# Patient Record
Sex: Male | Born: 1955
Health system: Southern US, Community
[De-identification: ages and names within clinical notes are randomized; demographics above are authoritative.]

## PROBLEM LIST (undated history)

## (undated) DIAGNOSIS — R748 Abnormal levels of other serum enzymes: Secondary | ICD-10-CM

## (undated) DIAGNOSIS — R011 Cardiac murmur, unspecified: Secondary | ICD-10-CM

## (undated) DIAGNOSIS — Z973 Presence of spectacles and contact lenses: Secondary | ICD-10-CM

## (undated) DIAGNOSIS — K219 Gastro-esophageal reflux disease without esophagitis: Secondary | ICD-10-CM

## (undated) DIAGNOSIS — M1990 Unspecified osteoarthritis, unspecified site: Secondary | ICD-10-CM

## (undated) DIAGNOSIS — E559 Vitamin D deficiency, unspecified: Secondary | ICD-10-CM

## (undated) DIAGNOSIS — Z87442 Personal history of urinary calculi: Secondary | ICD-10-CM

## (undated) DIAGNOSIS — E7800 Pure hypercholesterolemia, unspecified: Secondary | ICD-10-CM

## (undated) DIAGNOSIS — M5410 Radiculopathy, site unspecified: Secondary | ICD-10-CM

## (undated) DIAGNOSIS — I10 Essential (primary) hypertension: Secondary | ICD-10-CM

## (undated) DIAGNOSIS — M419 Scoliosis, unspecified: Secondary | ICD-10-CM

## (undated) DIAGNOSIS — G4730 Sleep apnea, unspecified: Secondary | ICD-10-CM

## (undated) HISTORY — PX: COLONOSCOPY W/ BIOPSIES AND POLYPECTOMY: SHX1376

## (undated) HISTORY — PX: BACK SURGERY: SHX140

## (undated) HISTORY — DX: Scoliosis, unspecified: M41.9

---

## 2000-10-05 HISTORY — PX: ANTERIOR FUSION CERVICAL SPINE: SUR626

## 2003-12-27 ENCOUNTER — Observation Stay (HOSPITAL_COMMUNITY): Admission: RE | Admit: 2003-12-27 | Discharge: 2003-12-28 | Payer: Self-pay | Admitting: Neurosurgery

## 2006-11-10 ENCOUNTER — Emergency Department: Payer: Self-pay | Admitting: Emergency Medicine

## 2011-05-04 ENCOUNTER — Emergency Department: Payer: Self-pay | Admitting: Emergency Medicine

## 2011-10-12 ENCOUNTER — Emergency Department: Payer: Self-pay | Admitting: Emergency Medicine

## 2011-10-12 LAB — URINALYSIS, COMPLETE
Bacteria: NONE SEEN
Bilirubin,UR: NEGATIVE
Glucose,UR: NEGATIVE mg/dL (ref 0–75)
Ketone: NEGATIVE
Leukocyte Esterase: NEGATIVE
Nitrite: NEGATIVE
Ph: 5 (ref 4.5–8.0)
Protein: NEGATIVE
RBC,UR: 36 /HPF (ref 0–5)
Specific Gravity: 1.014 (ref 1.003–1.030)
Squamous Epithelial: NONE SEEN
WBC UR: 1 /HPF (ref 0–5)

## 2011-10-12 LAB — CBC
HCT: 44.7 % (ref 40.0–52.0)
MCH: 31.4 pg (ref 26.0–34.0)
MCHC: 34.1 g/dL (ref 32.0–36.0)
MCV: 92 fL (ref 80–100)
Platelet: 192 10*3/uL (ref 150–440)
RBC: 4.84 10*6/uL (ref 4.40–5.90)
RDW: 13.6 % (ref 11.5–14.5)

## 2011-10-12 LAB — COMPREHENSIVE METABOLIC PANEL
Alkaline Phosphatase: 38 U/L — ABNORMAL LOW (ref 50–136)
BUN: 22 mg/dL — ABNORMAL HIGH (ref 7–18)
Bilirubin,Total: 0.4 mg/dL (ref 0.2–1.0)
Chloride: 103 mmol/L (ref 98–107)
Co2: 29 mmol/L (ref 21–32)
Creatinine: 1.33 mg/dL — ABNORMAL HIGH (ref 0.60–1.30)
EGFR (African American): >60
EGFR (Non-African Amer.): 59 — ABNORMAL LOW
Glucose: 110 mg/dL — ABNORMAL HIGH (ref 65–99)
Osmolality: 285 (ref 275–301)
SGPT (ALT): 60 U/L
Sodium: 141 mmol/L (ref 136–145)
Total Protein: 7.5 g/dL (ref 6.4–8.2)

## 2011-10-12 LAB — LIPASE, BLOOD: Lipase: 92 U/L (ref 73–393)

## 2014-05-21 ENCOUNTER — Ambulatory Visit: Payer: Self-pay | Admitting: Gastroenterology

## 2014-05-21 LAB — HM COLONOSCOPY

## 2015-11-29 ENCOUNTER — Encounter: Payer: Self-pay | Admitting: Family Medicine

## 2015-11-29 ENCOUNTER — Ambulatory Visit (INDEPENDENT_AMBULATORY_CARE_PROVIDER_SITE_OTHER): Payer: 59 | Admitting: Family Medicine

## 2015-11-29 VITALS — BP 114/72 | HR 58 | Temp 98.2°F | Ht 65.0 in | Wt 213.6 lb

## 2015-11-29 DIAGNOSIS — Z0001 Encounter for general adult medical examination with abnormal findings: Secondary | ICD-10-CM

## 2015-11-29 DIAGNOSIS — M21372 Foot drop, left foot: Secondary | ICD-10-CM | POA: Diagnosis not present

## 2015-11-29 DIAGNOSIS — R6889 Other general symptoms and signs: Secondary | ICD-10-CM

## 2015-11-29 DIAGNOSIS — Z0000 Encounter for general adult medical examination without abnormal findings: Secondary | ICD-10-CM | POA: Insufficient documentation

## 2015-11-29 NOTE — Assessment & Plan Note (Signed)
Overall doing well. Discussed diet and exercise. Advised him to eat more vegetables. He is up-to-date on colon cancer screening. He declined a flu shot. He is up-to-date on other vaccinations and HIV and hepatitis C testing. We will order labs as outlined below.

## 2015-11-29 NOTE — Assessment & Plan Note (Signed)
Patient describes symptoms of foot drop on the left side. No abnormalities seen on gait today. He does have numbness on the bottom of his left foot. Given his prior MRI findings of diffuse lumbar DDD concern would be that he has a nerve compression. We will repeat an MRI of his lumbar spine without contrast to evaluate this further. He has no red flags. He is otherwise neurologically intact. He'll continue to monitor. He is given return precautions.

## 2015-11-29 NOTE — Progress Notes (Signed)
Patient ID: Bobby [name], male   DOB: June 03, 1956, 60 y.o.   MRN: [id number]  Bobby Rumps, MD Phone: [phone]  Bobby [name] is a 60 y.o. male who presents today for new patient visit. Physical exam.  Patient reports he does not exercise. His diet is described as not being typically good. He eats whatever he wants. Does not eat many vegetables. Declines flu shot. Last tetanus shot was 2010. Colonoscopy was in 2015. Reports having had hepatitis C and HIV screening in the 1990s. Drinks alcohol socially. No cigarettes. No tobacco use. No recreational drug use.  Left foot numbness: Patient notes for the last year the bottom of his left foot has felt numb and tingly. Mostly in the ball of his foot. He also notes foot drop and describes this as his left foot flopping down. Notes the foot feels weaker than the other foot. Notes chronic back pain related to scoliosis. He denies saddle anesthesia, bowel or bladder incontinence, fevers, and history of cancer. He had an MRI several years ago that showed diffuse DDD.  Active Ambulatory Problems    Diagnosis Date Noted  . Foot drop, left 11/29/2015  . Encounter for general adult medical examination with abnormal findings 11/29/2015   Resolved Ambulatory Problems    Diagnosis Date Noted  . No Resolved Ambulatory Problems   Past Medical History  Diagnosis Date  . Kidney stones   . Scoliosis     Family History  Problem Relation Age of Onset  . Colon cancer    . Hyperlipidemia    . Hypertension    . Kidney disease      Social History   Social History  . Marital Status: Married    Spouse Name: N/A  . Number of Children: N/A  . Years of Education: N/A   Occupational History  . Not on file.   Social History Main Topics  . Smoking status: Former Research scientist (life sciences)  . Smokeless tobacco: Not on file  . Alcohol Use: 0.6 oz/week    1 Standard drinks or equivalent per week  . Drug Use: No  . Sexual Activity: Not on file   Other Topics  Concern  . Not on file   Social History Narrative  . No narrative on file    ROS   General:  Negative for nexplained weight loss, fever Skin: Negative for new or changing mole, sore that won't heal HEENT: Negative for trouble hearing, trouble seeing, ringing in ears, mouth sores, hoarseness, change in voice, dysphagia. CV:  Negative for chest pain, dyspnea, edema, palpitations Resp: Negative for cough, dyspnea, hemoptysis GI: Negative for nausea, vomiting, diarrhea, constipation, abdominal pain, melena, hematochezia. GU: Negative for dysuria, incontinence, urinary hesitance, hematuria, vaginal or penile discharge, polyuria, sexual difficulty, lumps in testicle or breasts MSK: Positive for joint pain, Negative for muscle cramps or aches, joint swelling Neuro: Positive for numbness, Negative for headaches, weakness, dizziness, passing out/fainting Psych: Negative for depression, anxiety, memory problems  Objective  Physical Exam Filed Vitals:   11/29/15 1042  BP: 114/72  Pulse: 58  Temp: 98.2 F (36.8 C)    BP Readings from Last 3 Encounters:  11/29/15 114/72   Wt Readings from Last 3 Encounters:  11/29/15 213 lb 9.6 oz (96.888 kg)    Physical Exam  Constitutional: He is well-developed, well-nourished, and in no distress.  HENT:  Head: Normocephalic and atraumatic.  Right Ear: External ear normal.  Left Ear: External ear normal.  Mouth/Throat: Oropharynx is clear and moist. No  oropharyngeal exudate.  Eyes: Conjunctivae are normal. Pupils are equal, round, and reactive to light.  Neck: Neck supple.  Cardiovascular: Normal rate, regular rhythm and normal heart sounds.  Exam reveals no gallop and no friction rub.   No murmur heard. Pulmonary/Chest: Effort normal and breath sounds normal. No respiratory distress. He has no wheezes. He has no rales.  Abdominal: Soft. Bowel sounds are normal. He exhibits no distension. There is no tenderness. There is no rebound and no  guarding.  Musculoskeletal:  No midline spine step-off or tenderness, no muscular back tenderness  Lymphadenopathy:    He has no cervical adenopathy.  Neurological: He is alert.  CN 2-12 intact, 5/5 strength in bilateral biceps, triceps, grip, quads, hamstrings, plantar and dorsiflexion, decreased sensation in the forefoot of his left foot with normal sensation in his toes, otherwise sensation to light touch intact in bilateral UE and LE, normal gait, 2+ patellar and Achilles reflexes  Skin: Skin is warm and dry. He is not diaphoretic.  Psychiatric: Mood and affect normal.     Assessment/Plan:   Foot drop, left Patient describes symptoms of foot drop on the left side. No abnormalities seen on gait today. He does have numbness on the bottom of his left foot. Given his prior MRI findings of diffuse lumbar DDD concern would be that he has a nerve compression. We will repeat an MRI of his lumbar spine without contrast to evaluate this further. He has no red flags. He is otherwise neurologically intact. He'll continue to monitor. He is given return precautions.  Encounter for general adult medical examination with abnormal findings Overall doing well. Discussed diet and exercise. Advised him to eat more vegetables. He is up-to-date on colon cancer screening. He declined a flu shot. He is up-to-date on other vaccinations and HIV and hepatitis C testing. We will order labs as outlined below.     Orders Placed This Encounter  Procedures  . MR Lumbar Spine Wo Contrast    Standing Status: Future     Number of Occurrences:      Standing Expiration Date: 01/26/2017    Order Specific Question:  Reason for Exam (SYMPTOM  OR DIAGNOSIS REQUIRED)    Answer:  left foot drop and numbness in ball of foot    Order Specific Question:  Preferred imaging location?    Answer:  [facility] (table limit-300lbs)    Order Specific Question:  Does the patient have a pacemaker or implanted devices?    Answer:   No    Order Specific Question:  What is the patient's sedation requirement?    Answer:  No Sedation  . Comp Met (CMET)  . CBC  . PSA  . TSH  . Lipid Profile    Bobby [name]

## 2015-11-29 NOTE — Patient Instructions (Signed)
Nice to meet you. We will order an MRI of your low back. We will obtain labs as well.  If you develop numbness, weakness, loss of bowel or bladder function, fever, or any new or change in symptoms please seek medical attention.

## 2015-11-29 NOTE — Progress Notes (Signed)
Pre visit review using our clinic review tool, if applicable. No additional management support is needed unless otherwise documented below in the visit note. 

## 2015-11-30 LAB — CBC
HEMATOCRIT: 44.6 % (ref 37.5–51.0)
HEMOGLOBIN: 15.4 g/dL (ref 12.6–17.7)
MCH: 30.4 pg (ref 26.6–33.0)
MCHC: 34.5 g/dL (ref 31.5–35.7)
MCV: 88 fL (ref 79–97)
Platelets: 219 10*3/uL (ref 150–379)
RBC: 5.06 x10E6/uL (ref 4.14–5.80)
RDW: 13.8 % (ref 12.3–15.4)
WBC: 6.5 10*3/uL (ref 3.4–10.8)

## 2015-11-30 LAB — COMPREHENSIVE METABOLIC PANEL
ALBUMIN: 4.4 g/dL (ref 3.5–5.5)
ALT: 48 IU/L — ABNORMAL HIGH (ref 0–44)
AST: 29 IU/L (ref 0–40)
Albumin/Globulin Ratio: 1.9 (ref 1.1–2.5)
Alkaline Phosphatase: 65 IU/L (ref 39–117)
BILIRUBIN TOTAL: 0.4 mg/dL (ref 0.0–1.2)
BUN/Creatinine Ratio: 17 (ref 9–20)
BUN: 16 mg/dL (ref 6–24)
CHLORIDE: 102 mmol/L (ref 96–106)
CO2: 23 mmol/L (ref 18–29)
Calcium: 9.6 mg/dL (ref 8.7–10.2)
Creatinine, Ser: 0.95 mg/dL (ref 0.76–1.27)
GFR calc non Af Amer: 87 mL/min/{1.73_m2} (ref >59)
GFR, EST AFRICAN AMERICAN: 101 mL/min/{1.73_m2} (ref >59)
GLOBULIN, TOTAL: 2.3 g/dL (ref 1.5–4.5)
GLUCOSE: 92 mg/dL (ref 65–99)
Potassium: 5 mmol/L (ref 3.5–5.2)
Sodium: 141 mmol/L (ref 134–144)
TOTAL PROTEIN: 6.7 g/dL (ref 6.0–8.5)

## 2015-11-30 LAB — PSA: Prostate Specific Ag, Serum: 1.1 ng/mL (ref 0.0–4.0)

## 2015-11-30 LAB — LIPID PANEL
CHOLESTEROL TOTAL: 177 mg/dL (ref 100–199)
Chol/HDL Ratio: 3.7 ratio units (ref 0.0–5.0)
HDL: 48 mg/dL (ref >39)
LDL Calculated: 96 mg/dL (ref 0–99)
TRIGLYCERIDES: 163 mg/dL — AB (ref 0–149)
VLDL CHOLESTEROL CAL: 33 mg/dL (ref 5–40)

## 2015-11-30 LAB — TSH: TSH: 1.87 u[IU]/mL (ref 0.450–4.500)

## 2015-12-23 ENCOUNTER — Telehealth: Payer: Self-pay | Admitting: *Deleted

## 2015-12-23 ENCOUNTER — Ambulatory Visit: Payer: 59

## 2015-12-23 ENCOUNTER — Other Ambulatory Visit (INDEPENDENT_AMBULATORY_CARE_PROVIDER_SITE_OTHER): Payer: 59

## 2015-12-23 DIAGNOSIS — R7989 Other specified abnormal findings of blood chemistry: Secondary | ICD-10-CM

## 2015-12-23 DIAGNOSIS — R945 Abnormal results of liver function studies: Principal | ICD-10-CM

## 2015-12-23 NOTE — Telephone Encounter (Signed)
Orders placed.

## 2015-12-23 NOTE — Telephone Encounter (Signed)
Labs and dx?  

## 2015-12-24 LAB — COMPREHENSIVE METABOLIC PANEL WITH GFR
ALT: 40 U/L (ref 0–53)
AST: 26 U/L (ref 0–37)
Albumin: 4.4 g/dL (ref 3.5–5.2)
Alkaline Phosphatase: 57 U/L (ref 39–117)
BUN: 17 mg/dL (ref 6–23)
CO2: 29 meq/L (ref 19–32)
Calcium: 9.6 mg/dL (ref 8.4–10.5)
Chloride: 104 meq/L (ref 96–112)
Creatinine, Ser: 1.1 mg/dL (ref 0.40–1.50)
GFR: 72.62 mL/min
Glucose, Bld: 91 mg/dL (ref 70–99)
Potassium: 4.4 meq/L (ref 3.5–5.1)
Sodium: 138 meq/L (ref 135–145)
Total Bilirubin: 0.5 mg/dL (ref 0.2–1.2)
Total Protein: 6.6 g/dL (ref 6.0–8.3)

## 2015-12-25 ENCOUNTER — Encounter: Payer: Self-pay | Admitting: Family Medicine

## 2016-01-07 ENCOUNTER — Ambulatory Visit
Admission: RE | Admit: 2016-01-07 | Discharge: 2016-01-07 | Disposition: A | Discharge status: Home / Self Care | Payer: 59 | Source: Ambulatory Visit | Attending: Family Medicine | Admitting: Family Medicine

## 2016-01-07 DIAGNOSIS — M5136 Other intervertebral disc degeneration, lumbar region: Secondary | ICD-10-CM | POA: Diagnosis not present

## 2016-01-07 DIAGNOSIS — M4806 Spinal stenosis, lumbar region: Secondary | ICD-10-CM | POA: Diagnosis not present

## 2016-01-07 DIAGNOSIS — M4186 Other forms of scoliosis, lumbar region: Secondary | ICD-10-CM | POA: Insufficient documentation

## 2016-01-07 DIAGNOSIS — M21372 Foot drop, left foot: Secondary | ICD-10-CM | POA: Insufficient documentation

## 2016-01-10 ENCOUNTER — Other Ambulatory Visit: Payer: Self-pay | Admitting: Family Medicine

## 2016-01-10 DIAGNOSIS — M48061 Spinal stenosis, lumbar region without neurogenic claudication: Secondary | ICD-10-CM

## 2016-01-13 ENCOUNTER — Telehealth: Payer: Self-pay | Admitting: Family Medicine

## 2016-01-13 NOTE — Telephone Encounter (Signed)
Pt returned your call. Pt request to call him back after 4pm.msn

## 2016-01-13 NOTE — Telephone Encounter (Signed)
His results were posted to his MyChart this was left on his voicemail.

## 2016-02-03 ENCOUNTER — Other Ambulatory Visit: Payer: Self-pay | Admitting: Neurosurgery

## 2016-02-10 ENCOUNTER — Encounter (HOSPITAL_COMMUNITY): Payer: Self-pay

## 2016-02-10 ENCOUNTER — Encounter (HOSPITAL_COMMUNITY)
Admission: RE | Admit: 2016-02-10 | Discharge: 2016-02-10 | Disposition: A | Discharge status: Home / Self Care | Payer: 59 | Source: Ambulatory Visit | Attending: Neurosurgery | Admitting: Neurosurgery

## 2016-02-10 DIAGNOSIS — M4726 Other spondylosis with radiculopathy, lumbar region: Secondary | ICD-10-CM | POA: Diagnosis not present

## 2016-02-10 DIAGNOSIS — Z01812 Encounter for preprocedural laboratory examination: Secondary | ICD-10-CM | POA: Insufficient documentation

## 2016-02-10 HISTORY — DX: Radiculopathy, site unspecified: M54.10

## 2016-02-10 HISTORY — DX: Cardiac murmur, unspecified: R01.1

## 2016-02-10 HISTORY — DX: Unspecified osteoarthritis, unspecified site: M19.90

## 2016-02-10 HISTORY — DX: Pure hypercholesterolemia, unspecified: E78.00

## 2016-02-10 HISTORY — DX: Abnormal levels of other serum enzymes: R74.8

## 2016-02-10 HISTORY — DX: Vitamin D deficiency, unspecified: E55.9

## 2016-02-10 HISTORY — DX: Sleep apnea, unspecified: G47.30

## 2016-02-10 HISTORY — DX: Gastro-esophageal reflux disease without esophagitis: K21.9

## 2016-02-10 HISTORY — DX: Presence of spectacles and contact lenses: Z97.3

## 2016-02-10 LAB — COMPREHENSIVE METABOLIC PANEL
ALK PHOS: 55 U/L (ref 38–126)
ALT: 43 U/L (ref 17–63)
ANION GAP: 8 (ref 5–15)
AST: 34 U/L (ref 15–41)
Albumin: 4.1 g/dL (ref 3.5–5.0)
BILIRUBIN TOTAL: 0.3 mg/dL (ref 0.3–1.2)
BUN: 14 mg/dL (ref 6–20)
CALCIUM: 9.4 mg/dL (ref 8.9–10.3)
CO2: 25 mmol/L (ref 22–32)
Chloride: 106 mmol/L (ref 101–111)
Creatinine, Ser: 1.02 mg/dL (ref 0.61–1.24)
GFR calc Af Amer: >60 mL/min (ref >60)
Glucose, Bld: 88 mg/dL (ref 65–99)
POTASSIUM: 4.4 mmol/L (ref 3.5–5.1)
Sodium: 139 mmol/L (ref 135–145)
TOTAL PROTEIN: 6.8 g/dL (ref 6.5–8.1)

## 2016-02-10 LAB — CBC
HEMATOCRIT: 43.7 % (ref 39.0–52.0)
Hemoglobin: 14.8 g/dL (ref 13.0–17.0)
MCH: 29.7 pg (ref 26.0–34.0)
MCHC: 33.9 g/dL (ref 30.0–36.0)
MCV: 87.6 fL (ref 78.0–100.0)
PLATELETS: 183 10*3/uL (ref 150–400)
RBC: 4.99 MIL/uL (ref 4.22–5.81)
RDW: 13 % (ref 11.5–15.5)
WBC: 6.5 10*3/uL (ref 4.0–10.5)

## 2016-02-10 LAB — SURGICAL PCR SCREEN
MRSA, PCR: NEGATIVE
Staphylococcus aureus: NEGATIVE

## 2016-02-10 NOTE — Pre-Procedure Instructions (Signed)
Bobby [name]  02/10/2016      [id number] - [place], [place] AT [facility] OF [address] [place] [address] Phone: [phone] Fax: [phone]    Your procedure is scheduled on Wednesday, Feb 19, 2016  Report to [facility] Admitting at 10:30 A.M.  Call this number if you have problems the morning of surgery:  [phone]   Remember:  Do not eat food or drink liquids after midnight Tuesday, Feb 18, 2016  Take these medicines the morning of surgery with A SIP OF WATER: if needed: TraMADol Veatrice Bourbon) for pain Stop taking Aspirin, vitamins, fish oil and herbal medications. Do not take any NSAIDs ie: Ibuprofen, Advil, Naproxen, BC and Goody Powder or any medication containing Aspirin; stop Wednesday, Feb 12, 2016.  Do not wear jewelry, make-up or nail polish.  Do not wear lotions, powders, or perfumes.  You may not wear deodorant.  Do not shave 48 hours prior to surgery.  Men may shave face and neck.  Do not bring valuables to the hospital.  [facility] is not responsible for any belongings or valuables.  Contacts, dentures or bridgework may not be worn into surgery.  Leave your suitcase in the car.  After surgery it may be brought to your room.  For patients admitted to the hospital, discharge time will be determined by your treatment team.  Patients discharged the day of surgery will not be allowed to drive home.   Name and phone number of your driver:   Special instructions:  Special Instructions:Special Instructions: [facility] - Preparing for Surgery  Before surgery, you can play an important role.  Because skin is not sterile, your skin needs to be as free of germs as possible.  You can reduce the number of germs on you skin by washing with CHG (chlorahexidine gluconate) soap before surgery.  CHG is an antiseptic cleaner which kills germs and bonds with the skin to continue killing germs even after  washing.  Please DO NOT use if you have an allergy to CHG or antibacterial soaps.  If your skin becomes reddened/irritated stop using the CHG and inform your nurse when you arrive at Short Stay.  Do not shave (including legs and underarms) for at least 48 hours prior to the first CHG shower.  You may shave your face.  Please follow these instructions carefully:   1.  Shower with CHG Soap the night before surgery and the morning of Surgery.  2.  If you choose to wash your hair, wash your hair first as usual with your normal shampoo.  3.  After you shampoo, rinse your hair and body thoroughly to remove the Shampoo.  4.  Use CHG as you would any other liquid soap.  You can apply chg directly  to the skin and wash gently with scrungie or a clean washcloth.  5.  Apply the CHG Soap to your body ONLY FROM THE NECK DOWN.  Do not use on open wounds or open sores.  Avoid contact with your eyes, ears, mouth and genitals (private parts).  Wash genitals (private parts) with your normal soap.  6.  Wash thoroughly, paying special attention to the area where your surgery will be performed.  7.  Thoroughly rinse your body with warm water from the neck down.  8.  DO NOT shower/wash with your normal soap after using and rinsing off the CHG  Soap.  9.  Pat yourself dry with a clean towel.            10.  Wear clean pajamas.            11.  Place clean sheets on your bed the night of your first shower and do not sleep with pets.  Day of Surgery  Do not apply any lotions/deodorants the morning of surgery.  Please wear clean clothes to the hospital/surgery center.  Please read over the following fact sheets that you were given. Pain Booklet, Coughing and Deep Breathing, MRSA Information and Surgical Site Infection Prevention

## 2016-02-10 NOTE — Progress Notes (Signed)
Pt denies SOB, chest pain, and being under the care of a cardiologist. Pt denies having a cardiac cath and echo but stated that a stress test was done >15 years ago. Spoke with Willeen Cass, NP, Anesthesia, regarding pt history of heart murmur " as a child only" and elevated liver enzymes when started on previous cholesterol medication; according to Levada Dy, NP, CMP is okay but EKG is not needed.

## 2016-02-19 ENCOUNTER — Ambulatory Visit (HOSPITAL_COMMUNITY): Payer: 59 | Admitting: Anesthesiology

## 2016-02-19 ENCOUNTER — Ambulatory Visit (HOSPITAL_COMMUNITY): Payer: 59

## 2016-02-19 ENCOUNTER — Encounter (HOSPITAL_COMMUNITY): Payer: Self-pay | Admitting: General Practice

## 2016-02-19 ENCOUNTER — Encounter (HOSPITAL_COMMUNITY)
Admission: RE | Disposition: A | Discharge status: Home / Self Care | Payer: Self-pay | Source: Ambulatory Visit | Attending: Neurosurgery

## 2016-02-19 ENCOUNTER — Observation Stay (HOSPITAL_COMMUNITY)
Admission: RE | Admit: 2016-02-19 | Discharge: 2016-02-20 | Disposition: A | Discharge status: Home / Self Care | Payer: 59 | Source: Ambulatory Visit | Attending: Neurosurgery | Admitting: Neurosurgery

## 2016-02-19 DIAGNOSIS — Z7982 Long term (current) use of aspirin: Secondary | ICD-10-CM | POA: Insufficient documentation

## 2016-02-19 DIAGNOSIS — Z87891 Personal history of nicotine dependence: Secondary | ICD-10-CM | POA: Insufficient documentation

## 2016-02-19 DIAGNOSIS — M21372 Foot drop, left foot: Secondary | ICD-10-CM | POA: Diagnosis not present

## 2016-02-19 DIAGNOSIS — Z6836 Body mass index (BMI) 36.0-36.9, adult: Secondary | ICD-10-CM | POA: Diagnosis not present

## 2016-02-19 DIAGNOSIS — K219 Gastro-esophageal reflux disease without esophagitis: Secondary | ICD-10-CM | POA: Diagnosis not present

## 2016-02-19 DIAGNOSIS — G4730 Sleep apnea, unspecified: Secondary | ICD-10-CM | POA: Diagnosis not present

## 2016-02-19 DIAGNOSIS — R011 Cardiac murmur, unspecified: Secondary | ICD-10-CM | POA: Insufficient documentation

## 2016-02-19 DIAGNOSIS — M419 Scoliosis, unspecified: Secondary | ICD-10-CM | POA: Insufficient documentation

## 2016-02-19 DIAGNOSIS — N400 Enlarged prostate without lower urinary tract symptoms: Secondary | ICD-10-CM | POA: Insufficient documentation

## 2016-02-19 DIAGNOSIS — M4806 Spinal stenosis, lumbar region: Principal | ICD-10-CM | POA: Insufficient documentation

## 2016-02-19 DIAGNOSIS — Z419 Encounter for procedure for purposes other than remedying health state, unspecified: Secondary | ICD-10-CM

## 2016-02-19 DIAGNOSIS — E785 Hyperlipidemia, unspecified: Secondary | ICD-10-CM | POA: Diagnosis not present

## 2016-02-19 DIAGNOSIS — M4726 Other spondylosis with radiculopathy, lumbar region: Secondary | ICD-10-CM | POA: Diagnosis present

## 2016-02-19 DIAGNOSIS — E669 Obesity, unspecified: Secondary | ICD-10-CM | POA: Insufficient documentation

## 2016-02-19 HISTORY — PX: LUMBAR LAMINECTOMY/DECOMPRESSION MICRODISCECTOMY: SHX5026

## 2016-02-19 SURGERY — LUMBAR LAMINECTOMY/DECOMPRESSION MICRODISCECTOMY 2 LEVELS
Anesthesia: General | Site: Back | Laterality: Left

## 2016-02-19 MED ORDER — ASPIRIN EC 81 MG PO TBEC
81.0000 mg | DELAYED_RELEASE_TABLET | Freq: Every day | ORAL | Status: DC
  Administered 2016-02-19: 81 mg via ORAL
  Filled 2016-02-19: qty 1

## 2016-02-19 MED ORDER — LIDOCAINE HCL (CARDIAC) 20 MG/ML IV SOLN
INTRAVENOUS | Status: DC | PRN
  Administered 2016-02-19: 80 mg via INTRAVENOUS

## 2016-02-19 MED ORDER — KETOROLAC TROMETHAMINE 30 MG/ML IJ SOLN
30.0000 mg | Freq: Four times a day (QID) | INTRAMUSCULAR | Status: DC
  Administered 2016-02-19 – 2016-02-20 (×2): 30 mg via INTRAVENOUS
  Filled 2016-02-19 (×2): qty 1

## 2016-02-19 MED ORDER — EPHEDRINE 5 MG/ML INJ
INTRAVENOUS | Status: AC
  Filled 2016-02-19: qty 10

## 2016-02-19 MED ORDER — ROCURONIUM BROMIDE 50 MG/5ML IV SOLN
INTRAVENOUS | Status: AC
  Filled 2016-02-19: qty 2

## 2016-02-19 MED ORDER — MIDAZOLAM HCL 5 MG/5ML IJ SOLN
INTRAMUSCULAR | Status: DC | PRN
  Administered 2016-02-19: 2 mg via INTRAVENOUS

## 2016-02-19 MED ORDER — ONDANSETRON HCL 4 MG/2ML IJ SOLN
INTRAMUSCULAR | Status: DC | PRN
  Administered 2016-02-19: 4 mg via INTRAVENOUS

## 2016-02-19 MED ORDER — OXYCODONE-ACETAMINOPHEN 5-325 MG PO TABS
1.0000 | ORAL_TABLET | ORAL | Status: DC | PRN
  Administered 2016-02-19 – 2016-02-20 (×3): 2 via ORAL
  Filled 2016-02-19 (×4): qty 2

## 2016-02-19 MED ORDER — SUFENTANIL CITRATE 50 MCG/ML IV SOLN
INTRAVENOUS | Status: AC
  Filled 2016-02-19: qty 1

## 2016-02-19 MED ORDER — DOCUSATE SODIUM 100 MG PO CAPS
100.0000 mg | ORAL_CAPSULE | Freq: Two times a day (BID) | ORAL | Status: DC
  Administered 2016-02-19: 100 mg via ORAL
  Filled 2016-02-19: qty 1

## 2016-02-19 MED ORDER — DEXTROSE 5 % IV SOLN
10.0000 mg | INTRAVENOUS | Status: DC | PRN
  Administered 2016-02-19: 40 ug/min via INTRAVENOUS

## 2016-02-19 MED ORDER — SENNOSIDES-DOCUSATE SODIUM 8.6-50 MG PO TABS: 1.0000 | ORAL_TABLET | Freq: Every evening | ORAL | Status: DC | PRN

## 2016-02-19 MED ORDER — LACTATED RINGERS IV SOLN
INTRAVENOUS | Status: DC
  Administered 2016-02-19: 11:00:00 via INTRAVENOUS

## 2016-02-19 MED ORDER — BUPIVACAINE HCL (PF) 0.5 % IJ SOLN
INTRAMUSCULAR | Status: DC | PRN
  Administered 2016-02-19: 30 mL

## 2016-02-19 MED ORDER — ONDANSETRON HCL 4 MG/2ML IJ SOLN: 4.0000 mg | INTRAMUSCULAR | Status: DC | PRN

## 2016-02-19 MED ORDER — CEFAZOLIN SODIUM-DEXTROSE 2-4 GM/100ML-% IV SOLN
INTRAVENOUS | Status: AC
  Filled 2016-02-19: qty 100

## 2016-02-19 MED ORDER — TRAMADOL HCL 50 MG PO TABS: 50.0000 mg | ORAL_TABLET | Freq: Four times a day (QID) | ORAL | Status: DC | PRN

## 2016-02-19 MED ORDER — BISACODYL 5 MG PO TBEC: 5.0000 mg | DELAYED_RELEASE_TABLET | Freq: Every day | ORAL | Status: DC | PRN

## 2016-02-19 MED ORDER — PHENOL 1.4 % MT LIQD: 1.0000 | OROMUCOSAL | Status: DC | PRN

## 2016-02-19 MED ORDER — ROCURONIUM BROMIDE 100 MG/10ML IV SOLN
INTRAVENOUS | Status: DC | PRN
Start: 2016-02-19 — End: 2016-02-19
  Administered 2016-02-19: 40 mg via INTRAVENOUS
  Administered 2016-02-19: 10 mg via INTRAVENOUS

## 2016-02-19 MED ORDER — HYDROMORPHONE HCL 1 MG/ML IJ SOLN
INTRAMUSCULAR | Status: AC
  Filled 2016-02-19: qty 1

## 2016-02-19 MED ORDER — ATORVASTATIN CALCIUM 20 MG PO TABS
20.0000 mg | ORAL_TABLET | Freq: Every day | ORAL | Status: DC
  Administered 2016-02-19: 20 mg via ORAL
  Filled 2016-02-19: qty 1

## 2016-02-19 MED ORDER — NEOSTIGMINE METHYLSULFATE 5 MG/5ML IV SOSY
PREFILLED_SYRINGE | INTRAVENOUS | Status: AC
  Filled 2016-02-19: qty 5

## 2016-02-19 MED ORDER — HYDROMORPHONE HCL 1 MG/ML IJ SOLN: 0.5000 mg | INTRAMUSCULAR | Status: DC | PRN

## 2016-02-19 MED ORDER — ZOLPIDEM TARTRATE 5 MG PO TABS: 5.0000 mg | ORAL_TABLET | Freq: Every evening | ORAL | Status: DC | PRN

## 2016-02-19 MED ORDER — PROPOFOL 10 MG/ML IV BOLUS
INTRAVENOUS | Status: DC | PRN
  Administered 2016-02-19: 140 mg via INTRAVENOUS

## 2016-02-19 MED ORDER — 0.9 % SODIUM CHLORIDE (POUR BTL) OPTIME
TOPICAL | Status: DC | PRN
  Administered 2016-02-19: 1000 mL

## 2016-02-19 MED ORDER — SODIUM CHLORIDE 0.9 % IJ SOLN
INTRAMUSCULAR | Status: AC
  Filled 2016-02-19: qty 10

## 2016-02-19 MED ORDER — OXYCODONE-ACETAMINOPHEN 5-325 MG PO TABS
ORAL_TABLET | ORAL | Status: AC
  Filled 2016-02-19: qty 2

## 2016-02-19 MED ORDER — LIDOCAINE 2% (20 MG/ML) 5 ML SYRINGE
INTRAMUSCULAR | Status: AC
  Filled 2016-02-19: qty 5

## 2016-02-19 MED ORDER — VITAMIN D 1000 UNITS PO TABS: 2000.0000 [IU] | ORAL_TABLET | Freq: Every morning | ORAL | Status: DC

## 2016-02-19 MED ORDER — ONDANSETRON HCL 4 MG/2ML IJ SOLN
4.0000 mg | Freq: Once | INTRAMUSCULAR | Status: DC | PRN
Start: 2016-02-19 — End: 2016-02-19

## 2016-02-19 MED ORDER — THROMBIN 20000 UNITS EX SOLR
CUTANEOUS | Status: DC | PRN
  Administered 2016-02-19: 15:00:00 via TOPICAL

## 2016-02-19 MED ORDER — SUFENTANIL CITRATE 50 MCG/ML IV SOLN
INTRAVENOUS | Status: DC | PRN
  Administered 2016-02-19: 5 ug via INTRAVENOUS
  Administered 2016-02-19: 20 ug via INTRAVENOUS

## 2016-02-19 MED ORDER — NEOSTIGMINE METHYLSULFATE 10 MG/10ML IV SOLN
INTRAVENOUS | Status: DC | PRN
  Administered 2016-02-19: 3 mg via INTRAVENOUS

## 2016-02-19 MED ORDER — ACETAMINOPHEN 325 MG PO TABS: 650.0000 mg | ORAL_TABLET | ORAL | Status: DC | PRN

## 2016-02-19 MED ORDER — EPHEDRINE SULFATE 50 MG/ML IJ SOLN
INTRAMUSCULAR | Status: DC | PRN
  Administered 2016-02-19: 5 mg via INTRAVENOUS

## 2016-02-19 MED ORDER — SODIUM CHLORIDE 0.9% FLUSH
3.0000 mL | Freq: Two times a day (BID) | INTRAVENOUS | Status: DC
  Administered 2016-02-19: 3 mL via INTRAVENOUS

## 2016-02-19 MED ORDER — PROPOFOL 10 MG/ML IV BOLUS
INTRAVENOUS | Status: AC
  Filled 2016-02-19: qty 20

## 2016-02-19 MED ORDER — MEPERIDINE HCL 25 MG/ML IJ SOLN: 6.2500 mg | INTRAMUSCULAR | Status: DC | PRN

## 2016-02-19 MED ORDER — TAMSULOSIN HCL 0.4 MG PO CAPS: 0.4000 mg | ORAL_CAPSULE | Freq: Every day | ORAL | Status: DC

## 2016-02-19 MED ORDER — GLYCOPYRROLATE 0.2 MG/ML IV SOSY
PREFILLED_SYRINGE | INTRAVENOUS | Status: AC
  Filled 2016-02-19: qty 3

## 2016-02-19 MED ORDER — POTASSIUM CHLORIDE IN NACL 20-0.9 MEQ/L-% IV SOLN
INTRAVENOUS | Status: DC
  Filled 2016-02-19 (×3): qty 1000

## 2016-02-19 MED ORDER — ACETAMINOPHEN 10 MG/ML IV SOLN
INTRAVENOUS | Status: AC
  Administered 2016-02-19: 1000 mg via INTRAVENOUS
  Filled 2016-02-19: qty 100

## 2016-02-19 MED ORDER — PHENYLEPHRINE 40 MCG/ML (10ML) SYRINGE FOR IV PUSH (FOR BLOOD PRESSURE SUPPORT)
PREFILLED_SYRINGE | INTRAVENOUS | Status: AC
  Filled 2016-02-19: qty 10

## 2016-02-19 MED ORDER — LACTATED RINGERS IV SOLN
INTRAVENOUS | Status: DC | PRN
Start: 2016-02-19 — End: 2016-02-19
  Administered 2016-02-19 (×2): via INTRAVENOUS

## 2016-02-19 MED ORDER — HYDROMORPHONE HCL 1 MG/ML IJ SOLN
0.2500 mg | INTRAMUSCULAR | Status: DC | PRN
  Administered 2016-02-19 (×4): 0.5 mg via INTRAVENOUS

## 2016-02-19 MED ORDER — MENTHOL 3 MG MT LOZG: 1.0000 | LOZENGE | OROMUCOSAL | Status: DC | PRN

## 2016-02-19 MED ORDER — ACETAMINOPHEN 650 MG RE SUPP: 650.0000 mg | RECTAL | Status: DC | PRN

## 2016-02-19 MED ORDER — LIDOCAINE-EPINEPHRINE 0.5 %-1:200000 IJ SOLN
INTRAMUSCULAR | Status: DC | PRN
  Administered 2016-02-19: 10 mL

## 2016-02-19 MED ORDER — SODIUM CHLORIDE 0.9% FLUSH: 3.0000 mL | INTRAVENOUS | Status: DC | PRN

## 2016-02-19 MED ORDER — MIDAZOLAM HCL 2 MG/2ML IJ SOLN
INTRAMUSCULAR | Status: AC
  Filled 2016-02-19: qty 2

## 2016-02-19 MED ORDER — DIAZEPAM 5 MG PO TABS
5.0000 mg | ORAL_TABLET | Freq: Four times a day (QID) | ORAL | Status: DC | PRN
  Administered 2016-02-19: 5 mg via ORAL
  Filled 2016-02-19: qty 1

## 2016-02-19 MED ORDER — HYDROCODONE-ACETAMINOPHEN 5-325 MG PO TABS: 1.0000 | ORAL_TABLET | ORAL | Status: DC | PRN

## 2016-02-19 MED ORDER — GLYCOPYRROLATE 0.2 MG/ML IJ SOLN
INTRAMUSCULAR | Status: DC | PRN
  Administered 2016-02-19: .2 mg via INTRAVENOUS
  Administered 2016-02-19: .4 mg via INTRAVENOUS

## 2016-02-19 MED ORDER — ONDANSETRON HCL 4 MG/2ML IJ SOLN
INTRAMUSCULAR | Status: AC
  Filled 2016-02-19: qty 2

## 2016-02-19 MED ORDER — MAGNESIUM CITRATE PO SOLN: 1.0000 | Freq: Once | ORAL | Status: DC | PRN

## 2016-02-19 MED ORDER — CEFAZOLIN SODIUM-DEXTROSE 2-4 GM/100ML-% IV SOLN: 2.0000 g | INTRAVENOUS | Status: DC

## 2016-02-19 SURGICAL SUPPLY — 56 items
BAG DECANTER FOR FLEXI CONT (MISCELLANEOUS) IMPLANT
BENZOIN TINCTURE PRP APPL 2/3 (GAUZE/BANDAGES/DRESSINGS) IMPLANT
BLADE CLIPPER SURG (BLADE) ×3 IMPLANT
BUR MATCHSTICK NEURO 3.0 LAGG (BURR) ×3 IMPLANT
BUR ROUND FLUTED 5 RND (BURR) ×2 IMPLANT
BUR ROUND FLUTED 5MM RND (BURR) ×1
CANISTER SUCT 3000ML PPV (MISCELLANEOUS) ×3 IMPLANT
CLOSURE WOUND 1/2 X4 (GAUZE/BANDAGES/DRESSINGS)
DECANTER SPIKE VIAL GLASS SM (MISCELLANEOUS) ×3 IMPLANT
DRAPE LAPAROTOMY 100X72X124 (DRAPES) ×3 IMPLANT
DRAPE MICROSCOPE LEICA (MISCELLANEOUS) ×3 IMPLANT
DRAPE POUCH INSTRU U-SHP 10X18 (DRAPES) ×3 IMPLANT
DRAPE SURG 17X23 STRL (DRAPES) ×3 IMPLANT
DRSG OPSITE POSTOP 4X8 (GAUZE/BANDAGES/DRESSINGS) ×3 IMPLANT
DURAPREP 26ML APPLICATOR (WOUND CARE) ×3 IMPLANT
ELECT REM PT RETURN 9FT ADLT (ELECTROSURGICAL) ×3
ELECTRODE REM PT RTRN 9FT ADLT (ELECTROSURGICAL) ×1 IMPLANT
GAUZE SPONGE 4X4 12PLY STRL (GAUZE/BANDAGES/DRESSINGS) IMPLANT
GAUZE SPONGE 4X4 16PLY XRAY LF (GAUZE/BANDAGES/DRESSINGS) IMPLANT
GLOVE BIOGEL PI IND STRL 7.0 (GLOVE) ×4 IMPLANT
GLOVE BIOGEL PI IND STRL 7.5 (GLOVE) ×3 IMPLANT
GLOVE BIOGEL PI IND STRL 8 (GLOVE) ×1 IMPLANT
GLOVE BIOGEL PI INDICATOR 7.0 (GLOVE) ×8
GLOVE BIOGEL PI INDICATOR 7.5 (GLOVE) ×6
GLOVE BIOGEL PI INDICATOR 8 (GLOVE) ×2
GLOVE ECLIPSE 6.5 STRL STRAW (GLOVE) ×3 IMPLANT
GLOVE ECLIPSE 7.5 STRL STRAW (GLOVE) ×3 IMPLANT
GLOVE EXAM NITRILE LRG STRL (GLOVE) IMPLANT
GLOVE EXAM NITRILE MD LF STRL (GLOVE) IMPLANT
GLOVE EXAM NITRILE XL STR (GLOVE) IMPLANT
GLOVE EXAM NITRILE XS STR PU (GLOVE) IMPLANT
GOWN STRL REUS W/ TWL LRG LVL3 (GOWN DISPOSABLE) ×4 IMPLANT
GOWN STRL REUS W/ TWL XL LVL3 (GOWN DISPOSABLE) ×1 IMPLANT
GOWN STRL REUS W/TWL 2XL LVL3 (GOWN DISPOSABLE) IMPLANT
GOWN STRL REUS W/TWL LRG LVL3 (GOWN DISPOSABLE) ×8
GOWN STRL REUS W/TWL XL LVL3 (GOWN DISPOSABLE) ×2
KIT BASIN OR (CUSTOM PROCEDURE TRAY) ×3 IMPLANT
KIT ROOM TURNOVER OR (KITS) ×3 IMPLANT
LIQUID BAND (GAUZE/BANDAGES/DRESSINGS) ×3 IMPLANT
NEEDLE HYPO 25X1 1.5 SAFETY (NEEDLE) ×3 IMPLANT
NEEDLE SPNL 18GX3.5 QUINCKE PK (NEEDLE) ×3 IMPLANT
NS IRRIG 1000ML POUR BTL (IV SOLUTION) ×3 IMPLANT
PACK LAMINECTOMY NEURO (CUSTOM PROCEDURE TRAY) ×3 IMPLANT
PAD ARMBOARD 7.5X6 YLW CONV (MISCELLANEOUS) ×9 IMPLANT
RUBBERBAND STERILE (MISCELLANEOUS) ×6 IMPLANT
SPONGE LAP 4X18 X RAY DECT (DISPOSABLE) IMPLANT
SPONGE SURGIFOAM ABS GEL 100 (HEMOSTASIS) ×3 IMPLANT
SPONGE SURGIFOAM ABS GEL SZ50 (HEMOSTASIS) IMPLANT
STRIP CLOSURE SKIN 1/2X4 (GAUZE/BANDAGES/DRESSINGS) IMPLANT
SUT VIC AB 0 CT1 18XCR BRD8 (SUTURE) ×2 IMPLANT
SUT VIC AB 0 CT1 8-18 (SUTURE) ×4
SUT VIC AB 2-0 CT1 18 (SUTURE) ×3 IMPLANT
SUT VIC AB 3-0 SH 8-18 (SUTURE) ×9 IMPLANT
TOWEL OR 17X24 6PK STRL BLUE (TOWEL DISPOSABLE) ×3 IMPLANT
TOWEL OR 17X26 10 PK STRL BLUE (TOWEL DISPOSABLE) ×3 IMPLANT
WATER STERILE IRR 1000ML POUR (IV SOLUTION) ×3 IMPLANT

## 2016-02-19 NOTE — Op Note (Signed)
02/19/2016  3:30 PM  PATIENT:  Bobby [name]  60 y.o. male with left lower extremity pain and a left foot drop. MRI shows significant lateral recess stenosis at L3/4,4/5,5/S1 for which he will undergo lumbar decompression.   PRE-OPERATIVE DIAGNOSIS:  Osteoarthritis of spine with radiculopathy, lumbar 3/4,4/5,5S1 POST-OPERATIVE DIAGNOSIS:  Osteoarthritis of spine with radiculopathy, lumbar 3/4,4/5, 5/S1  PROCEDURE:  Procedure(s): Laminectomy and Foraminotomy -Left Lumbar three-four, Lumbar four-five, Lumbar five-Sacral one   SURGEON: Surgeon(s): Ashok Pall, MD Jovita Gamma, MD  ASSISTANTS:Nudelman, Herbie Baltimore  ANESTHESIA:   general  EBL:  Total I/O In: 1600 [I.V.:1600] Out: 250 [Blood:250]  BLOOD ADMINISTERED:none  CELL SAVER GIVEN:none  COUNT:per nursing  DRAINS: none   SPECIMEN:  No Specimen  DICTATION: Bobby [name] was taken to the operating room, intubated, and placed under a general anesthetic without difficulty. He was positioned prone on a Wilson, frame with all pressure points padded properly.  male His back was prepped and draped in a sterile manner. I opened the skin with a 10 blade and took the incision down to the thoracolumbar fascia. I then exposed the lamina of L3,4,5, and S1 on the left. I confirmed my location with an intraoperative xray. I then used the drill to perform a semihemilaminectomy of L3,4,5,S1 on the left. I used Kerrison punches to remove more bone overlying the theca and the nerve roots. The L3,4,5,and S1 nerve roots were decompressed along with the spinal canal from L3 to S1. I along with Dr. Sherwood Gambler ensured that the nerve roots were well decompressed. We used microdissection during the case. I then irrigated and we closed the wound in layers. We used vicryl sutures to approximate the thoracolumbar, subcutaneous, subcuticular planes. I used dermabond and an occlusive bandage for a sterile dressing.   PLAN OF CARE: Admit to inpatient   PATIENT  DISPOSITION:  PACU - hemodynamically stable.   Delay start of Pharmacological VTE agent (>24hrs) due to surgical blood loss or risk of bleeding:  yes

## 2016-02-19 NOTE — Transfer of Care (Signed)
Immediate Anesthesia Transfer of Care Note  Patient: Bobby [name]  Procedure(s) Performed: Procedure(s): Laminectomy and Foraminotomy -Left Lumbar three-four, Lumbar four-five, Lumbar five-Sacral one  (Left)  Patient Location: PACU  Anesthesia Type:General  Level of Consciousness: awake, alert , oriented and patient cooperative  Airway & Oxygen Therapy: Patient Spontanous Breathing and Patient connected to nasal cannula oxygen  Post-op Assessment: Report given to RN, Post -op Vital signs reviewed and stable and Patient moving all extremities  Post vital signs: Reviewed and stable  Last Vitals:  Filed Vitals:   02/19/16 1032  BP: 139/87  Pulse: 58  Temp: 37.1 C  Resp: 20    Last Pain:  Filed Vitals:   02/19/16 1538  PainSc: 3       Patients Stated Pain Goal: 2 ([id number])  Complications: No apparent anesthesia complications

## 2016-02-19 NOTE — Anesthesia Procedure Notes (Signed)
Procedure Name: Intubation Date/Time: 02/19/2016 1:15 PM Performed by: Izora Gala Pre-anesthesia Checklist: Patient identified, Emergency Drugs available, Suction available and Patient being monitored Patient Re-evaluated:Patient Re-evaluated prior to inductionOxygen Delivery Method: Circle system utilized Preoxygenation: Pre-oxygenation with 100% oxygen Intubation Type: IV induction Ventilation: Mask ventilation without difficulty Laryngoscope Size: Miller and 3 Grade View: Grade I Tube type: Oral Tube size: 7.5 mm Number of attempts: 1 Airway Equipment and Method: Stylet Placement Confirmation: positive ETCO2 and ETT inserted through vocal cords under direct vision

## 2016-02-19 NOTE — H&P (Signed)
BP 139/87 mmHg  Pulse 58  Temp(Src) 98.7 F (37.1 C) (Oral)  Resp 20  Ht 5\' 4"  (1.626 m)  Wt 97.07 kg (214 lb)  BMI 36.72 kg/m2  SpO2 100%  Bobby [name] is a 60 year old gentleman who presents today for evaluation of pain that he has in his back, and weakness in his left dorsiflexors for about the last two years.  He says he has pain and numbness in his left foot, leg, arm, and neck.  He says he did not associate the floppy foot that he describes to the left side as with the pain.  More recently over the last few months, the pain has increased significantly.  He did receive injections about four years ago, and he says he would not want to try that again.  He says he also has some problems at times with dropping things, and then pain in his left hand.  I had done a lumbar surgery in 2005, and he has done well after that.     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     REVIEW OF SYSTEMS:                        Review of systems is positive for eyeglasses, hypercholesterolemia, swelling in the feet, leg pain with walking, arm weakness, leg weakness, back pain, arm pain, leg pain, joint pain, neck pain, problems with coordination in his legs.  He denies constitutional, ear, nose, throat, mouth, respiratory, gastrointestinal, genitourinary, skin, psychiatric, endocrine, hematologic or allergic problems.  On his pain chart he shows some pain in the left shoulder, left lower extremity, left hand, and right hand also.     FAMILY HISTORY:                                Mother died age 81.  Father died age 14.  Kidney disease and colon cancer present in the family history.     ALLERGIES:                                         He has no known drug allergies.     CURRENT MEDICATIONS:                     He takes Tramadol which helps a little.  Other medications are Atorvastatin, Aspirin, and Vitamin D.     SOCIAL HISTORY:                                He does not smoke.  He does drink  alcohol socially.  He does not use illicit drugs.                PHYSICAL EXAMINATION:                    Vital signs height 5 feet 4.5 inches, weight 215.6 pounds, blood pressure is 145/87, pulse is 67, temperature is 99.4, respiratory rate is 14.  Pain is 8/10.     On examination he is alert, oriented x4, and answering all questions appropriately.  Memory, language, attention span, and fund of knowledge is normal.  Pupils are equal, round, and reactive to light.  Full extraocular  movements.  Full visual fields.  Reflex is 2+ at the biceps, triceps, brachioradialis, knees, and ankle.  He is unable to heel walk on the left side, he also has some difficulty with toe walking.  Romberg is negative.  Muscle tone, bulk, and coordination is also normal.  Gait is abnormal as he does have a week dorsiflexor on the left side.  Proprioception is intact.  Toes are downgoing to plantar stimulation.  No clonus, no Hoffman's sign appreciated.      IMAGING STUDIES:                              MRI shows left-sided foraminal narrowing at L4-5, L5-S1, and some at L3-4.  L5 is easily the worst level causing the narrowing on the left side, and overall stenosis.  L3-4 is not that far behind, and there certainly is stenosis there.  At L5-S1 there is foraminal narrowing and facet hypertrophy just crowding that left L5 root.      IMPRESSION/PLAN:                             Since I know he has weakness in the L5 distribution, L5 certainly has to be done.  L4-5 has to be done because he probably has got some of the little bit of the L5 root in the canal.  L3-4 is only iffy because while he is stenotic there, I do not believe it is enough to cause that many problems to L5.  At L3-4, it may be that I would just do the decompression, and not worry about the disc.  His conus is normal, cauda equina is normal, paraspinous left tissues are normal.      Bobby [name] at this point says enough is enough.  He is unwilling to do more  injections, he has had oral steroids, he would not like to wait longer to see if this would improve, as it has not to date.  He has had two years worth of weakness in that left foot, and I am not sure that after two years, that it will recover, but it certainly cannot recover unless some surgery is done, but I do believe that this will help with the pain that he is experiencing.  I think overall, his status will be better.  I will do my best to not go into the disc spaces, only if I need to, just so that we do not affect in a negative fashion his overall stability.  As I said, he does have a little bit of scoliosis present.     Risks and benefits, bleeding, infection, no relief, need for further surgery, worsened pain, bowel or bladder dysfunction, and other risks were discussed.  He understands and wishes to proceed.

## 2016-02-19 NOTE — Anesthesia Preprocedure Evaluation (Addendum)
Anesthesia Evaluation    Airway Mallampati: III  TM Distance: >3 FB Neck ROM: Full    Dental no notable dental hx. (+) Teeth Intact, Caps   Pulmonary sleep apnea , former smoker,  Not on CPAP after weight loss   Pulmonary exam normal breath sounds clear to auscultation       Cardiovascular negative cardio ROS Normal cardiovascular exam+ Valvular Problems/Murmurs  Rhythm:Regular Rate:Normal     Neuro/Psych Left foot drop  Neuromuscular disease negative psych ROS   GI/Hepatic GERD  ,  Endo/Other  Obesity Hyperlipidemia  Renal/GU Renal diseaseHx/o renal calculi   BPH    Musculoskeletal  (+) Arthritis , Osteoarthritis,  Scoliosis OA Spine with radiculopathy   Abdominal (+) + obese,   Peds  Hematology negative hematology ROS (+)   Anesthesia Other Findings   Reproductive/Obstetrics                            Anesthesia Physical Anesthesia Plan  ASA: II  Anesthesia Plan: General   Post-op Pain Management:    Induction: Intravenous  Airway Management Planned: Oral ETT  Additional Equipment:   Intra-op Plan:   Post-operative Plan: Extubation in OR  Informed Consent: I have reviewed the patients History and Physical, chart, labs and discussed the procedure including the risks, benefits and alternatives for the proposed anesthesia with the patient or authorized representative who has indicated his/her understanding and acceptance.   Dental advisory given  Plan Discussed with: CRNA, Anesthesiologist and Surgeon  Anesthesia Plan Comments:         Anesthesia Quick Evaluation

## 2016-02-19 NOTE — Anesthesia Postprocedure Evaluation (Signed)
Anesthesia Post Note  Patient: Bobby [name]  Procedure(s) Performed: Procedure(s) (LRB): Laminectomy and Foraminotomy -Left Lumbar three-four, Lumbar four-five, Lumbar five-Sacral one  (Left)  Patient location during evaluation: PACU Anesthesia Type: General Level of consciousness: awake and alert Pain management: pain level controlled Vital Signs Assessment: post-procedure vital signs reviewed and stable Respiratory status: spontaneous breathing, nonlabored ventilation, respiratory function stable and patient connected to nasal cannula oxygen Cardiovascular status: blood pressure returned to baseline and stable Postop Assessment: no signs of nausea or vomiting Anesthetic complications: no    Last Vitals:  Filed Vitals:   02/19/16 1630 02/19/16 1637  BP:  122/93  Pulse: 74 59  Temp:    Resp: 19 20    Last Pain:  Filed Vitals:   02/19/16 1646  PainSc: Asleep                 Akshitha Culmer A.

## 2016-02-20 ENCOUNTER — Encounter (HOSPITAL_COMMUNITY): Payer: Self-pay | Admitting: Neurosurgery

## 2016-02-20 DIAGNOSIS — M4806 Spinal stenosis, lumbar region: Secondary | ICD-10-CM | POA: Diagnosis not present

## 2016-02-20 MED ORDER — HYDROCODONE-ACETAMINOPHEN 5-325 MG PO TABS: 1.0000 | ORAL_TABLET | Freq: Four times a day (QID) | ORAL | Status: DC | PRN

## 2016-02-20 MED ORDER — TIZANIDINE HCL 4 MG PO TABS: 4.0000 mg | ORAL_TABLET | Freq: Four times a day (QID) | ORAL | Status: DC | PRN

## 2016-02-20 NOTE — Progress Notes (Signed)
Pt doing well. Pt and wife given D/C instructions with Rx's, verbal understanding was provided. Pt's incision is clean and dry with no sign of infection. Pt's IV was removed prior to D/C. Pt D/C'd home via walking @ 1035 per MD order. Pt is stable @ D/C and has no other needs at this time. Holli Humbles, RN

## 2016-02-20 NOTE — Discharge Instructions (Signed)
Lumbar Discectomy °Care After °A discectomy involves removal of discmaterial (the cartilage-like structures located between the bones of the back). It is done to relieve pressure on nerve roots. It can be used as a treatment for a back problem. The time in surgery depends on the findings in surgery and what is necessary to correct the problems. °HOME CARE INSTRUCTIONS  °· Check the cut (incision) made by the surgeon twice a day for signs of infection. Some signs of infection may include:  °· A foul smelling, greenish or yellowish discharge from the wound.  °· Increased pain.  °· Increased redness over the incision (operative) site.  °· The skin edges may separate.  °· Flu-like symptoms (problems).  °· A temperature above 101.5° F (38.6° C).  °· Change your bandages in about 24 to 36 hours following surgery or as directed.  °· You may shower tomrrow.  Avoid bathtubs, swimming pools and hot tubs for three weeks or until your incision has healed completely. °· Follow your doctor's instructions as to safe activities, exercises, and physical therapy.  °· Weight reduction may be beneficial if you are overweight.  °· Daily exercise is helpful to prevent the return of problems. Walking is permitted. You may use a treadmill without an incline. Cut down on activities and exercise if you have discomfort. You may also go up and down stairs as much as you can tolerate.  °· DO NOT lift anything heavier than 10 to 15 lbs. Avoid bending or twisting at the waist. Always bend your knees when lifting.  °· Maintain strength and range of motion as instructed.  °· Do not drive for 10 days, or as directed by your doctors. You may be a passenger . Lying back in the passenger seat may be more comfortable for you. Always wear a seatbelt.  °· Limit your sitting in a regular chair to 20 to 30 minutes at a time. There are no limitations for sitting in a recliner. You should lie down or walk in between sitting periods.  °· Only take  over-the-counter or prescription medicines for pain, discomfort, or fever as directed by your caregiver.  °SEEK MEDICAL CARE IF:  °· There is increased bleeding (more than a small spot) from the wound.  °· You notice redness, swelling, or increasing pain in the wound.  °· Pus is coming from wound.  °· You develop an unexplained oral temperature above 102° F (38.9° C) develops.  °· You notice a foul smell coming from the wound or dressing.  °· You have increasing pain in your wound.  °SEEK IMMEDIATE MEDICAL CARE IF:  °· You develop a rash.  °· You have difficulty breathing.  °· You develop any allergic problems to medicines given.  °Document Released: 08/26/2004 Document Revised: 09/10/2011 Document Reviewed: 12/15/2007 °ExitCare® Patient Information °

## 2016-02-28 ENCOUNTER — Ambulatory Visit: Payer: 59 | Admitting: Family Medicine

## 2016-02-28 NOTE — Discharge Summary (Signed)
  Physician Discharge Summary  Patient ID: Bobby [name] MRN: [id number] DOB/AGE: 02-06-1956 60 y.o.  Admit date: 02/19/2016 Discharge date: 02/19/2016 Admission Diagnoses:  Discharge Diagnoses:  Active Problems:   Osteoarthritis of spine with radiculopathy, lumbar region   Discharged Condition: good  Hospital Course: [name] was admitted and taken to the operating room for an uncomplicated lumbar decompression. Post op he was ambulating, voiding, and tolerating a regular diet. His wound is clean, dry, and without signs of infection.   Treatments: surgery: PROCEDURE: Procedure(s): Laminectomy and Foraminotomy -Left Lumbar three-four, Lumbar four-five, Lumbar five-Sacral one   Discharge Exam: Blood pressure 114/75, pulse 52, temperature 98.3 F (36.8 C), temperature source Oral, resp. rate 18, height 5\' 4"  (1.626 m), weight 97.07 kg (214 lb), SpO2 100 %. General appearance: alert, cooperative and no distress Neurologic: Alert and oriented X 3, normal strength and tone. Normal symmetric reflexes. Normal coordination and gait  Disposition: 01-Home or Self Care Osteoarthritis of spine with radiculopathy, lumbar    Medication List    TAKE these medications        aspirin 81 MG tablet  Take 81 mg by mouth daily.     atorvastatin 20 MG tablet  Commonly known as:  LIPITOR  Take 20 mg by mouth daily at 6 PM.     HYDROcodone-acetaminophen 5-325 MG tablet  Commonly known as:  NORCO/VICODIN  Take 1 tablet by mouth every 6 (six) hours as needed for moderate pain.     tamsulosin 0.4 MG Caps capsule  Commonly known as:  FLOMAX  Take 0.4 mg by mouth daily after supper.     tiZANidine 4 MG tablet  Commonly known as:  ZANAFLEX  Take 1 tablet (4 mg total) by mouth every 6 (six) hours as needed for muscle spasms.     traMADol 50 MG tablet  Commonly known as:  ULTRAM  Take 50 mg by mouth every 6 (six) hours as needed for moderate pain or severe pain.     Vitamin D 2000 units  Caps  Take 2,000 Units by mouth.           Follow-up Information    Follow up with Kalkidan Caudell L, MD In 3 weeks.   Specialty:  Neurosurgery   Why:  please call to make an appointment   Contact information:   [address] [place] [address] [phone]       Signed: Winfield Cunas 02/28/2016, 2:50 PM

## 2016-06-03 ENCOUNTER — Telehealth: Payer: Self-pay | Admitting: Family Medicine

## 2016-06-03 MED ORDER — ATORVASTATIN CALCIUM 20 MG PO TABS: 20.0000 mg | ORAL_TABLET | Freq: Every day | ORAL | 1 refills | Status: DC

## 2016-06-03 NOTE — Telephone Encounter (Signed)
Pt called about needing a refill for atorvastatin (LIPITOR) 20 MG tablet. Pt is scheduled to come in on 07/29/16. Please advise?  Pharmacy is [facility], [place] AT [address]  Call pt @ [phone]. Thank you!

## 2016-06-03 NOTE — Telephone Encounter (Signed)
Refilled till appt, thanks

## 2016-06-16 ENCOUNTER — Telehealth: Payer: Self-pay | Admitting: Family Medicine

## 2016-06-16 NOTE — Telephone Encounter (Signed)
This was refilled on 8/30. thanks

## 2016-06-16 NOTE — Telephone Encounter (Signed)
Pt has an appt on 07/29/16 with Dr. Caryl Bis. He only has 2 pills remaining. Could he get a refill on his atorvastatin (LIPITOR) 20 MG tablet.

## 2016-07-29 ENCOUNTER — Ambulatory Visit: Payer: 59 | Admitting: Family Medicine

## 2016-09-01 ENCOUNTER — Other Ambulatory Visit: Payer: Self-pay | Admitting: Surgical

## 2016-09-01 ENCOUNTER — Telehealth: Payer: Self-pay | Admitting: *Deleted

## 2016-09-01 NOTE — Telephone Encounter (Signed)
LM for patient to return call to verify pharmacy that RX should go to.

## 2016-09-01 NOTE — Telephone Encounter (Addendum)
Pt requested a medication refill for atorvastatin  Pharmacy [facility]

## 2016-09-01 NOTE — Telephone Encounter (Signed)
Pt pharmacy is [phone]. Thank you!

## 2016-09-02 MED ORDER — ATORVASTATIN CALCIUM 20 MG PO TABS: 20.0000 mg | ORAL_TABLET | Freq: Every day | ORAL | 1 refills | Status: DC

## 2016-09-02 NOTE — Telephone Encounter (Signed)
RX sent

## 2017-01-08 ENCOUNTER — Ambulatory Visit (INDEPENDENT_AMBULATORY_CARE_PROVIDER_SITE_OTHER): Payer: 59 | Admitting: Family Medicine

## 2017-01-08 ENCOUNTER — Ambulatory Visit (INDEPENDENT_AMBULATORY_CARE_PROVIDER_SITE_OTHER): Payer: 59

## 2017-01-08 ENCOUNTER — Encounter: Payer: Self-pay | Admitting: Family Medicine

## 2017-01-08 ENCOUNTER — Telehealth: Payer: Self-pay | Admitting: Family Medicine

## 2017-01-08 VITALS — BP 138/80 | HR 68 | Temp 98.1°F | Wt 211.4 lb

## 2017-01-08 DIAGNOSIS — M542 Cervicalgia: Secondary | ICD-10-CM | POA: Insufficient documentation

## 2017-01-08 DIAGNOSIS — M47812 Spondylosis without myelopathy or radiculopathy, cervical region: Secondary | ICD-10-CM | POA: Diagnosis not present

## 2017-01-08 MED ORDER — TRAMADOL HCL 50 MG PO TABS: 50.0000 mg | ORAL_TABLET | Freq: Four times a day (QID) | ORAL | 0 refills | Status: DC | PRN

## 2017-01-08 MED ORDER — TIZANIDINE HCL 4 MG PO TABS: 4.0000 mg | ORAL_TABLET | Freq: Four times a day (QID) | ORAL | 0 refills | Status: DC | PRN

## 2017-01-08 NOTE — Telephone Encounter (Signed)
Pt called to check on his xray results. Please advise?  Call pt @ [phone]. Thank you!

## 2017-01-08 NOTE — Assessment & Plan Note (Signed)
Patient with 10 days of right-sided neck discomfort that has slowly worsened and is now described as severe. Worsens with rotational movement to the right. No other new symptoms with this. Neurologically intact. He has a history of a cervical spine fusion. Suspect musculoskeletal cause. We will obtain an x-ray of his neck. We will start on Zanaflex and tramadol as these have been beneficial in the past for similar complaints. He was warned that these may make him drowsy. We'll determine the next step in management once his x-ray returns. She is given return precautions.

## 2017-01-08 NOTE — Patient Instructions (Signed)
Nice to see you. Your discomfort is likely related to a strained muscle or nerve impingement. We will obtain an x-ray of your neck. We will treat with tramadol and Zanaflex. If you develop worsening pain, numbness, weakness, worsening headaches with this, vision changes, or any new or change in symptoms please seek medical attention immediately.

## 2017-01-08 NOTE — Telephone Encounter (Signed)
See other message

## 2017-01-08 NOTE — Progress Notes (Signed)
[name], MD Phone: [phone]  YOSHIAKI KREUSER is a 61 y.o. male who presents today for same-day visit.  Patient notes he woke up about 10 days ago with slight right-sided neck discomfort. Since then it has progressively gotten more severe. Notes it hurts on rotation to the right. Notes it is there all the time though at times it does worsen. He notes some chronic numbness in his left hand that has been there for years. He notes no new numbness. He notes no weakness. No vision changes. Does note mild right posterior headache with this. Notes this does improve at times. Tramadol is helping. He reports a history of fusion in his neck.  PMH: Former smoker   ROS see history of present illness  Objective  Physical Exam Vitals:   01/08/17 0826  BP: 138/80  Pulse: 68  Temp: 98.1 F (36.7 C)    BP Readings from Last 3 Encounters:  01/08/17 138/80  02/20/16 114/75  02/10/16 121/85   Wt Readings from Last 3 Encounters:  01/08/17 211 lb 6.4 oz (95.9 kg)  02/19/16 214 lb (97.1 kg)  02/10/16 214 lb 11.7 oz (97.4 kg)    Physical Exam  Cardiovascular: Normal rate, regular rhythm and normal heart sounds.   No carotid bruits, 2+ radial pulses  Pulmonary/Chest: Effort normal and breath sounds normal.  Musculoskeletal: He exhibits no edema.  No midline spine tenderness, no midline spine step-off, no muscular neck tenderness, there is discomfort on rotation to the right with slight limitation in range of motion, no discomfort on rotation to the left  Neurological: He is alert.  CN 2-12 intact, 5/5 strength in bilateral biceps, triceps, grip, quads, hamstrings, plantar and dorsiflexion, sensation to light touch intact in bilateral UE and LE, normal gait, normal finger to nose, normal rapid alternating movements, negative Romberg, no pronator drift  Skin: Skin is warm and dry. He is not diaphoretic.     Assessment/Plan: Please see individual problem list.  Neck pain Patient  with 10 days of right-sided neck discomfort that has slowly worsened and is now described as severe. Worsens with rotational movement to the right. No other new symptoms with this. Neurologically intact. He has a history of a cervical spine fusion. Suspect musculoskeletal cause. We will obtain an x-ray of his neck. We will start on Zanaflex and tramadol as these have been beneficial in the past for similar complaints. He was warned that these may make him drowsy. We'll determine the next step in management once his x-ray returns. She is given return precautions.   Orders Placed This Encounter  Procedures  . DG Cervical Spine Complete    Standing Status:   Future    Standing Expiration Date:   03/10/2018    Order Specific Question:   Reason for Exam (SYMPTOM  OR DIAGNOSIS REQUIRED)    Answer:   neck pain, worsening over the past 10 days, history of neck fusion    Order Specific Question:   Preferred imaging location?    Answer:   [facility] Specific Question:   Radiology Contrast Protocol - do NOT remove file path    Answer:   [email or web address]    Meds ordered this encounter  Medications  . tiZANidine (ZANAFLEX) 4 MG tablet    Sig: Take 1 tablet (4 mg total) by mouth every 6 (six) hours as needed for muscle spasms.    Dispense:  60 tablet    Refill:  0  . traMADol (  ULTRAM) 50 MG tablet    Sig: Take 1 tablet (50 mg total) by mouth every 6 (six) hours as needed for moderate pain or severe pain.    Dispense:  30 tablet    Refill:  0   [name], MD [place]

## 2017-01-08 NOTE — Progress Notes (Signed)
Pre visit review using our clinic review tool, if applicable. No additional management support is needed unless otherwise documented below in the visit note. 

## 2017-01-11 ENCOUNTER — Other Ambulatory Visit: Payer: Self-pay | Admitting: Family Medicine

## 2017-01-11 DIAGNOSIS — M47812 Spondylosis without myelopathy or radiculopathy, cervical region: Secondary | ICD-10-CM

## 2017-02-14 ENCOUNTER — Other Ambulatory Visit: Payer: Self-pay | Admitting: Family Medicine

## 2017-02-15 DIAGNOSIS — M4722 Other spondylosis with radiculopathy, cervical region: Secondary | ICD-10-CM | POA: Diagnosis not present

## 2017-02-15 DIAGNOSIS — M4726 Other spondylosis with radiculopathy, lumbar region: Secondary | ICD-10-CM | POA: Diagnosis not present

## 2017-02-16 ENCOUNTER — Other Ambulatory Visit: Payer: Self-pay | Admitting: Neurosurgery

## 2017-02-16 DIAGNOSIS — M4722 Other spondylosis with radiculopathy, cervical region: Secondary | ICD-10-CM

## 2017-02-25 ENCOUNTER — Ambulatory Visit: Payer: 59

## 2017-03-10 ENCOUNTER — Ambulatory Visit
Admission: RE | Admit: 2017-03-10 | Discharge: 2017-03-10 | Disposition: A | Discharge status: Home / Self Care | Payer: 59 | Source: Ambulatory Visit | Attending: Neurosurgery | Admitting: Neurosurgery

## 2017-03-10 DIAGNOSIS — M4802 Spinal stenosis, cervical region: Secondary | ICD-10-CM | POA: Insufficient documentation

## 2017-03-10 DIAGNOSIS — M1288 Other specific arthropathies, not elsewhere classified, other specified site: Secondary | ICD-10-CM | POA: Insufficient documentation

## 2017-03-10 DIAGNOSIS — M2578 Osteophyte, vertebrae: Secondary | ICD-10-CM | POA: Diagnosis not present

## 2017-03-10 DIAGNOSIS — M4722 Other spondylosis with radiculopathy, cervical region: Secondary | ICD-10-CM | POA: Diagnosis present

## 2017-03-10 DIAGNOSIS — M542 Cervicalgia: Secondary | ICD-10-CM | POA: Diagnosis not present

## 2017-03-25 DIAGNOSIS — M4722 Other spondylosis with radiculopathy, cervical region: Secondary | ICD-10-CM | POA: Diagnosis not present

## 2017-03-25 DIAGNOSIS — M4726 Other spondylosis with radiculopathy, lumbar region: Secondary | ICD-10-CM | POA: Diagnosis not present

## 2017-03-26 ENCOUNTER — Other Ambulatory Visit: Payer: Self-pay | Admitting: Neurosurgery

## 2017-03-26 DIAGNOSIS — M4726 Other spondylosis with radiculopathy, lumbar region: Secondary | ICD-10-CM

## 2017-04-01 ENCOUNTER — Ambulatory Visit
Admission: RE | Admit: 2017-04-01 | Discharge: 2017-04-01 | Disposition: A | Discharge status: Home / Self Care | Payer: 59 | Source: Ambulatory Visit | Attending: Neurosurgery | Admitting: Neurosurgery

## 2017-04-01 DIAGNOSIS — M48061 Spinal stenosis, lumbar region without neurogenic claudication: Secondary | ICD-10-CM | POA: Diagnosis not present

## 2017-04-01 DIAGNOSIS — M4186 Other forms of scoliosis, lumbar region: Secondary | ICD-10-CM | POA: Diagnosis not present

## 2017-04-01 DIAGNOSIS — M5136 Other intervertebral disc degeneration, lumbar region: Secondary | ICD-10-CM | POA: Diagnosis not present

## 2017-04-01 DIAGNOSIS — M4184 Other forms of scoliosis, thoracic region: Secondary | ICD-10-CM | POA: Diagnosis not present

## 2017-04-01 DIAGNOSIS — M4726 Other spondylosis with radiculopathy, lumbar region: Secondary | ICD-10-CM | POA: Diagnosis present

## 2017-04-01 LAB — POCT I-STAT CREATININE: Creatinine, Ser: 0.9 mg/dL (ref 0.61–1.24)

## 2017-04-01 MED ORDER — GADOBENATE DIMEGLUMINE 529 MG/ML IV SOLN
20.0000 mL | Freq: Once | INTRAVENOUS | Status: AC | PRN
  Administered 2017-04-01: 20 mL via INTRAVENOUS

## 2017-04-13 DIAGNOSIS — M4726 Other spondylosis with radiculopathy, lumbar region: Secondary | ICD-10-CM | POA: Diagnosis not present

## 2017-04-13 DIAGNOSIS — M4150 Other secondary scoliosis, site unspecified: Secondary | ICD-10-CM | POA: Diagnosis not present

## 2017-06-14 DIAGNOSIS — M5416 Radiculopathy, lumbar region: Secondary | ICD-10-CM | POA: Diagnosis not present

## 2017-06-14 DIAGNOSIS — M4150 Other secondary scoliosis, site unspecified: Secondary | ICD-10-CM | POA: Diagnosis not present

## 2017-06-14 DIAGNOSIS — M4726 Other spondylosis with radiculopathy, lumbar region: Secondary | ICD-10-CM | POA: Diagnosis not present

## 2017-06-30 ENCOUNTER — Other Ambulatory Visit: Payer: Self-pay | Admitting: Neurosurgery

## 2017-07-07 ENCOUNTER — Telehealth: Payer: Self-pay | Admitting: Vascular Surgery

## 2017-07-07 NOTE — Telephone Encounter (Signed)
Sched appt 08/10/17 at 9:30. Lm on hm#.

## 2017-07-07 NOTE — Telephone Encounter (Signed)
-----   Message from Willy Eddy, RN sent at 07/02/2017  5:04 PM EDT ----- Regarding: Needs consult appointment with TEF Please Schedule consult appt with Dr. Donnetta Hutching prior to ALIF on 11/28. Remind patient to bring LS spine  films to this appointment.

## 2017-07-12 ENCOUNTER — Other Ambulatory Visit: Payer: Self-pay | Admitting: *Deleted

## 2017-07-30 NOTE — H&P (Signed)
Patient ID:   [id number] Patient: Bobby [name]  Date of Birth: April 06, 1956 Visit Type: Office Visit   Date: 06/14/2017 12:30 PM Provider: Marchia Meiers. Vertell Limber MD   This 61 year old male presents for back pain.   History of Present Illness: 1.  back pain  Patient presents with left leg numbness radiating to the foot. He grades this pain as 8/10 with a range of 5-10/10. Numbness in the left arm radiating to the 4th and fifth digit. He notes numbness worsens when he lays on his left side.  MRI 04/01/17: Postoperative changes on the left from L3-4 to L5-S1 since the prior MRI. Improved thecal sac patency at those levels. Mild residual left lateral recess stenosis, perhaps moderate at L3-4. Stable moderate left neural foraminal stenosis from the L3-4 nerve levels. Increased degenerative appearing marrow edema in the left L4 and L5 pedicles is accompanied by increased residual left L4-5 facet degeneration, and may reflect increased biomechanical stress following the left side laminectomies. No new or increased lumbar stenosis. Underlying moderate levoconvex scoliosis with chronic thoracic and lumbar disc, endplate, and facet degeneration.         Medical/Surgical/Interim History Reviewed, no change.  Last detailed document date:03/12/2016.     Family History: Reviewed, no changes.  Last detailed document date:03/12/2016.   Social History: Reviewed, no changes. Last detailed document date: 03/12/2016.    MEDICATIONS(added, continued or stopped this visit): Started Medication Directions Instruction Stopped   Aspir-81  BUCCAL      atorvastatin 20 mg tablet take 1 tablet by oral route  every day     tramadol 50 mg tablet take 1 tablet by oral route  every 6 hours as needed  06/14/2017  06/14/2017 tramadol 50 mg tablet take 1 tablet by oral route  every 6 hours as needed     Vitamin B-12 50 mcg tablet      Vitamin D3 2,000 unit capsule        ALLERGIES: Ingredient Reaction Medication  Name Comment  NO KNOWN ALLERGIES     No known allergies. Reviewed, no changes.    Vitals Date Temp F BP Pulse Ht In Wt Lb BMI BSA Pain Score  06/14/2017  164/116 58 64 215 36.9  4/10      IMPRESSION Patient presents with left leg and left arm numbness and tingling. MRI of lumbar spine shows levoconvex scoliosis with chronic thoracic and lumbar disc, endplate, and facet degeneration. MRI of cervical spine shows bone spur at left C3-4. On confrontational testing, mildly positive Spurling to the left, positive Tinel's left elbow, full strength in upper extremities, 4/5 left EHL, reflexes are symmetric. Schedule stage I L5-S1 ALIF and L1-2, L2-3, L3-4, and L4-5 XLIF; stage II T9-pelvis screws.  Completed Orders (this encounter) Order Details Reason Side Interpretation Result Initial Treatment Date Region  Hypertension education Continue to monitor blood pressure. If blood pressure remains elevated contact primary care provider        Dietary management education, guidance, and counseling patient encouraged to eat a well balanced diet         Assessment/Plan # Detail Type Description   1. Assessment Scoliosis due to degenerative disease of spine in adult patient (M41.50).   Plan Orders TLSO Brace.       2. Assessment Lumbar radiculopathy (M54.16).       3. Assessment Elevated blood-pressure reading, w/o diagnosis of htn (R03.0).       4. Assessment Body mass index (BMI) 36.0-36.9, adult (Z68.36).  Plan Orders Today's instructions / counseling include(s) Dietary management education, guidance, and counseling.           Pain Management Plan Pain Scale: 4/10. Method: Numeric Pain Intensity Scale. Location: back. Onset: 01/16/2017. Duration: varies. Quality: discomforting. Pain management follow-up plan of care: Patient taking medication as prescribed..  Fall Risk Plan The patient has not fallen in the last year.  Schedule stage I L5-S1 ALIF and L1-2, L2-3, L3-4, and L4-5  XLIF; stage II T9-pelvis screws. Nurse education given. Fit for TLSO brace.  Orders: Diagnostic Procedures: Assessment Procedure  M54.16 Scoliosis- AP/Lat  Instruction(s)/Education: Assessment Instruction  R03.0 Hypertension education  Z68.36 Dietary management education, guidance, and counseling  Miscellaneous: Assessment   M41.50 TLSO Brace    MEDICATIONS PRESCRIBED TODAY    Rx Quantity Refills  TRAMADOL HCL 50 mg  60 0            Provider:  Vertell Limber MD, Marchia Meiers 06/14/2017 2:27 PM  Dictation edited by: Lucita Lora    CC Providers: [name] [address] [place],  [place]  [id number]-   [name]  [address] [place], [place] [phone]-              Electronically signed by Marchia Meiers. Vertell Limber MD on 06/15/2017 05:37 PM

## 2017-08-06 ENCOUNTER — Other Ambulatory Visit: Payer: Self-pay | Admitting: *Deleted

## 2017-08-10 ENCOUNTER — Encounter: Payer: Self-pay | Admitting: Vascular Surgery

## 2017-08-10 ENCOUNTER — Ambulatory Visit (INDEPENDENT_AMBULATORY_CARE_PROVIDER_SITE_OTHER): Payer: 59 | Admitting: Vascular Surgery

## 2017-08-10 VITALS — BP 127/92 | HR 77 | Temp 98.2°F | Resp 20 | Ht 64.0 in | Wt 213.0 lb

## 2017-08-10 DIAGNOSIS — M5137 Other intervertebral disc degeneration, lumbosacral region: Secondary | ICD-10-CM | POA: Diagnosis not present

## 2017-08-10 NOTE — Progress Notes (Signed)
[facility]  Patient name: Bobby [name] MRN: [id number] DOB: September 05, 1956 Sex: male  REASON FOR CONSULT: Discussed anterior exposure for L5-S1 disc surgery  HPI: [name] is a 61 y.o. male, who is here today for discussion of anterior exposure for L5-S1 lumbar fusion.  He is scheduled for anterior approach on November 28 and then posterior surgery with Dr. Vertell Limber on November 30.  Back pain and also pain radiating down to his right leg.  This is been present for several years.  He did undergo posterior surgery to his back in May 2017.  He has no history of cardiac disease.  No history of peripheral vascular occlusive disease.  He has had no prior intra-abdominal surgery.  Past Medical History:  Diagnosis Date  . Elevated liver enzymes   . GERD (gastroesophageal reflux disease)   . Heart murmur    as a child only; outgrew it  . Hypercholesterolemia   . Kidney stones   . Osteoarthritis    spine  . Radiculopathy   . Scoliosis   . Sleep apnea    does not wear CPAP since weight loss  . Vitamin D deficiency   . Wears glasses     Family History  Problem Relation Age of Onset  . Pancreatitis Unknown   . Hypertension Mother   . Kidney disease Mother   . Hyperlipidemia Mother   . Colon cancer Father   . Diabetes Other     SOCIAL HISTORY: Social History   Socioeconomic History  . Marital status: Married    Spouse name: Not on file  . Number of children: Not on file  . Years of education: Not on file  . Highest education level: Not on file  Social Needs  . Financial resource strain: Not on file  . Food insecurity - worry: Not on file  . Food insecurity - inability: Not on file  . Transportation needs - medical: Not on file  . Transportation needs - non-medical: Not on file  Occupational History  . Not on file  Tobacco Use  . Smoking status: Former Smoker    Types: Cigarettes, Cigars  . Smokeless  tobacco: Never Used  . Tobacco comment: " quit smoking around 2007"  Substance and Sexual Activity  . Alcohol use: Yes    Alcohol/week: 0.6 oz    Types: 1 Standard drinks or equivalent per week    Comment: occasional  . Drug use: No  . Sexual activity: Not on file  Other Topics Concern  . Not on file  Social History Narrative  . Not on file    No Known Allergies  Current Outpatient Medications  Medication Sig Dispense Refill  . cyanocobalamin 500 MCG tablet Take 500 mcg daily by mouth.    Marland Kitchen aspirin 81 MG tablet Take 81 mg by mouth daily.     Marland Kitchen atorvastatin (LIPITOR) 20 MG tablet TAKE 1 TABLET BY MOUTH  DAILY AT 6 PM. 90 tablet 2  . Cholecalciferol (VITAMIN D) 2000 units CAPS Take 2,000 Units by mouth.    Marland Kitchen tiZANidine (ZANAFLEX) 4 MG tablet Take 1 tablet (4 mg total) by mouth every 6 (six) hours as needed for muscle spasms. 60 tablet 0  . traMADol (ULTRAM) 50 MG tablet Take 1 tablet (50 mg total) by mouth every 6 (six) hours as needed for moderate pain or severe pain. 30 tablet 0   No current facility-administered medications for this visit.  REVIEW OF SYSTEMS:  [X]  denotes positive finding, [ ]  denotes negative finding Cardiac  Comments:  Chest pain or chest pressure:    Shortness of breath upon exertion:    Short of breath when lying flat:    Irregular heart rhythm:        Vascular    Pain in calf, thigh, or hip brought on by ambulation: x   Pain in feet at night that wakes you up from your sleep:     Blood clot in your veins:    Leg swelling:         Pulmonary    Oxygen at home:    Productive cough:     Wheezing:         Neurologic    Sudden weakness in arms or legs:     Sudden numbness in arms or legs:     Sudden onset of difficulty speaking or slurred speech:    Temporary loss of vision in one eye:     Problems with dizziness:         Gastrointestinal    Blood in stool:     Vomited blood:         Genitourinary    Burning when urinating:     Blood in  urine:        Psychiatric    Major depression:         Hematologic    Bleeding problems:    Problems with blood clotting too easily:        Skin    Rashes or ulcers:        Constitutional    Fever or chills:      PHYSICAL EXAM: Vitals:   08/10/17 0925  BP: (!) 127/92  Pulse: 77  Resp: 20  Temp: 98.2 F (36.8 C)  TempSrc: Oral  SpO2: 94%  Weight: 213 lb (96.6 kg)  Height: 5\' 4"  (1.626 m)    GENERAL: The patient is a well-nourished male, in no acute distress. The vital signs are documented above. CARDIOVASCULAR: 2+ radial 2+ femoral and 2+ dorsalis pedis pulses bilaterally PULMONARY: There is good air exchange  ABDOMEN: Soft and non-tender.  No masses noted.  MUSCULOSKELETAL: There are no major deformities or cyanosis. NEUROLOGIC: No focal weakness or paresthesias are detected. SKIN: There are no ulcers or rashes noted. PSYCHIATRIC: The patient has a normal affect.  DATA:  I reviewed his plain films from the prior AP and lateral lumbar views from May 2017.  This shows no evidence of calcification  MEDICAL ISSUES: Had long discussion with the patient and his wife present regarding my role for anterior exposure.  Explained location of the incision and also explained mobilization of the rectus muscle, intraperitoneal contents, left ureter, arterial and venous structures overlying the spine.  Explained potential for injury to all of these.  Also explained the risk for bleeding and infection.  Also discussed the possibility of retrograde ejaculation.  They understand the procedure and wished to proceed.   Rosetta Posner, MD FACS [facility] Tel [phone] Pager [phone]

## 2017-08-10 NOTE — Progress Notes (Deleted)
[facility] [place]  Patient name: Bobby [name] MRN: [id number] DOB: 1955-11-02 Sex: male  REASON FOR CONSULT: ***  HPI: [name] is a 61 y.o. male, who is ***  Past Medical History:  Diagnosis Date  . Elevated liver enzymes   . GERD (gastroesophageal reflux disease)   . Heart murmur    as a child only; outgrew it  . Hypercholesterolemia   . Kidney stones   . Osteoarthritis    spine  . Radiculopathy   . Scoliosis   . Sleep apnea    does not wear CPAP since weight loss  . Vitamin D deficiency   . Wears glasses     Family History  Problem Relation Age of Onset  . Pancreatitis Unknown   . Hypertension Mother   . Kidney disease Mother   . Hyperlipidemia Mother   . Colon cancer Father   . Diabetes Other     SOCIAL HISTORY: Social History   Socioeconomic History  . Marital status: Married    Spouse name: Not on file  . Number of children: Not on file  . Years of education: Not on file  . Highest education level: Not on file  Social Needs  . Financial resource strain: Not on file  . Food insecurity - worry: Not on file  . Food insecurity - inability: Not on file  . Transportation needs - medical: Not on file  . Transportation needs - non-medical: Not on file  Occupational History  . Not on file  Tobacco Use  . Smoking status: Former Smoker    Types: Cigarettes, Cigars  . Smokeless tobacco: Never Used  . Tobacco comment: " quit smoking around 2007"  Substance and Sexual Activity  . Alcohol use: Yes    Alcohol/week: 0.6 oz    Types: 1 Standard drinks or equivalent per week    Comment: occasional  . Drug use: No  . Sexual activity: Not on file  Other Topics Concern  . Not on file  Social History Narrative  . Not on file    No Known Allergies  Current Outpatient Medications  Medication Sig Dispense Refill  . cyanocobalamin 500 MCG tablet Take 500 mcg daily by mouth.    Marland Kitchen aspirin 81  MG tablet Take 81 mg by mouth daily.     Marland Kitchen atorvastatin (LIPITOR) 20 MG tablet TAKE 1 TABLET BY MOUTH  DAILY AT 6 PM. 90 tablet 2  . Cholecalciferol (VITAMIN D) 2000 units CAPS Take 2,000 Units by mouth.    Marland Kitchen tiZANidine (ZANAFLEX) 4 MG tablet Take 1 tablet (4 mg total) by mouth every 6 (six) hours as needed for muscle spasms. 60 tablet 0  . traMADol (ULTRAM) 50 MG tablet Take 1 tablet (50 mg total) by mouth every 6 (six) hours as needed for moderate pain or severe pain. 30 tablet 0   No current facility-administered medications for this visit.     REVIEW OF SYSTEMS:  [X]  denotes positive finding, [ ]  denotes negative finding Cardiac  Comments:  Chest pain or chest pressure: ***   Shortness of breath upon exertion:    Short of breath when lying flat:    Irregular heart rhythm:        Vascular    Pain in calf, thigh, or hip brought on by ambulation:    Pain in feet at night that wakes you up from your sleep:     Blood clot in your veins:  Leg swelling:         Pulmonary    Oxygen at home:    Productive cough:     Wheezing:         Neurologic    Sudden weakness in arms or legs:     Sudden numbness in arms or legs:     Sudden onset of difficulty speaking or slurred speech:    Temporary loss of vision in one eye:     Problems with dizziness:         Gastrointestinal    Blood in stool:     Vomited blood:         Genitourinary    Burning when urinating:     Blood in urine:        Psychiatric    Major depression:         Hematologic    Bleeding problems:    Problems with blood clotting too easily:        Skin    Rashes or ulcers:        Constitutional    Fever or chills:      PHYSICAL EXAM: Vitals:   08/10/17 0925  BP: (!) 127/92  Pulse: 77  Resp: 20  Temp: 98.2 F (36.8 C)  TempSrc: Oral  SpO2: 94%  Weight: 213 lb (96.6 kg)  Height: 5\' 4"  (1.626 m)    GENERAL: The patient is a well-nourished male, in no acute distress. The vital signs are documented  above. CARDIOVASCULAR: *** PULMONARY: There is good air exchange  ABDOMEN: Soft and non-tender  MUSCULOSKELETAL: There are no major deformities or cyanosis. NEUROLOGIC: No focal weakness or paresthesias are detected. SKIN: There are no ulcers or rashes noted. PSYCHIATRIC: The patient has a normal affect.  DATA:  ***  MEDICAL ISSUES: ***   Rosetta Posner, MD FACS [facility] Tel [phone] Pager [phone]

## 2017-08-18 ENCOUNTER — Other Ambulatory Visit: Payer: Self-pay | Admitting: *Deleted

## 2017-08-23 ENCOUNTER — Other Ambulatory Visit: Payer: Self-pay

## 2017-08-23 ENCOUNTER — Encounter (HOSPITAL_COMMUNITY)
Admission: RE | Admit: 2017-08-23 | Discharge: 2017-08-23 | Disposition: A | Discharge status: Home / Self Care | Payer: 59 | Source: Ambulatory Visit | Attending: Neurosurgery | Admitting: Neurosurgery

## 2017-08-23 ENCOUNTER — Encounter (HOSPITAL_COMMUNITY): Payer: Self-pay

## 2017-08-23 DIAGNOSIS — E7800 Pure hypercholesterolemia, unspecified: Secondary | ICD-10-CM | POA: Insufficient documentation

## 2017-08-23 DIAGNOSIS — M542 Cervicalgia: Secondary | ICD-10-CM | POA: Insufficient documentation

## 2017-08-23 DIAGNOSIS — Z01812 Encounter for preprocedural laboratory examination: Secondary | ICD-10-CM | POA: Diagnosis present

## 2017-08-23 DIAGNOSIS — Z01810 Encounter for preprocedural cardiovascular examination: Secondary | ICD-10-CM | POA: Diagnosis not present

## 2017-08-23 DIAGNOSIS — M4726 Other spondylosis with radiculopathy, lumbar region: Secondary | ICD-10-CM | POA: Diagnosis not present

## 2017-08-23 DIAGNOSIS — M21372 Foot drop, left foot: Secondary | ICD-10-CM | POA: Diagnosis not present

## 2017-08-23 DIAGNOSIS — R001 Bradycardia, unspecified: Secondary | ICD-10-CM | POA: Insufficient documentation

## 2017-08-23 DIAGNOSIS — R011 Cardiac murmur, unspecified: Secondary | ICD-10-CM | POA: Diagnosis not present

## 2017-08-23 HISTORY — DX: Personal history of urinary calculi: Z87.442

## 2017-08-23 LAB — CBC
HCT: 46.7 % (ref 39.0–52.0)
Hemoglobin: 16.2 g/dL (ref 13.0–17.0)
MCH: 30.5 pg (ref 26.0–34.0)
MCHC: 34.7 g/dL (ref 30.0–36.0)
MCV: 87.8 fL (ref 78.0–100.0)
PLATELETS: 189 10*3/uL (ref 150–400)
RBC: 5.32 MIL/uL (ref 4.22–5.81)
RDW: 13 % (ref 11.5–15.5)
WBC: 6.7 10*3/uL (ref 4.0–10.5)

## 2017-08-23 LAB — SURGICAL PCR SCREEN
MRSA, PCR: NEGATIVE
STAPHYLOCOCCUS AUREUS: NEGATIVE

## 2017-08-23 LAB — COMPREHENSIVE METABOLIC PANEL
ALBUMIN: 4 g/dL (ref 3.5–5.0)
ALT: 46 U/L (ref 17–63)
AST: 30 U/L (ref 15–41)
Alkaline Phosphatase: 53 U/L (ref 38–126)
Anion gap: 7 (ref 5–15)
BUN: 16 mg/dL (ref 6–20)
CHLORIDE: 103 mmol/L (ref 101–111)
CO2: 26 mmol/L (ref 22–32)
CREATININE: 1.02 mg/dL (ref 0.61–1.24)
Calcium: 9.6 mg/dL (ref 8.9–10.3)
GFR calc Af Amer: >60 mL/min (ref >60)
GFR calc non Af Amer: >60 mL/min (ref >60)
Glucose, Bld: 96 mg/dL (ref 65–99)
POTASSIUM: 4.3 mmol/L (ref 3.5–5.1)
SODIUM: 136 mmol/L (ref 135–145)
TOTAL PROTEIN: 6.6 g/dL (ref 6.5–8.1)
Total Bilirubin: 0.4 mg/dL (ref 0.3–1.2)

## 2017-08-23 LAB — ABO/RH: ABO/RH(D): A POS

## 2017-08-23 LAB — TYPE AND SCREEN
ABO/RH(D): A POS
Antibody Screen: NEGATIVE

## 2017-08-23 NOTE — Pre-Procedure Instructions (Signed)
Bobby [name]  08/23/2017      [facility] - [address] [place] AT [facility] [address] [place] [address] Phone: [phone] Fax: [phone]  [place], [place] [facility] [address] [place] [address] [place] [address] Phone: [phone] Fax: [phone]    Your procedure is scheduled on Wednesday, Nov. 28th   Report to [facility] Admitting at 5:30 AM             (posted surgery 7:30a - 3:15p)   Call this number if you have problems the morning of surgery:  [phone]   Remember:               4-5 days prior to surgery, STOP TAKING any Vitamins, herbal supplements & anti-inflammatories.   Do not eat food or drink liquids after midnight Tuesday.   Take these medicines the morning of surgery with A SIP OF WATER : Zanaflex, Tramadol   Do not wear jewelry - no rings or watches.  Do not wear lotions, colognes or deoderant.             Men may shave face and neck.   Do not bring valuables to the hospital.  [facility] is not responsible for any belongings or valuables.  Contacts, dentures or bridgework may not be worn into surgery.  Leave your suitcase in the car.  After surgery it may be brought to your room.  For patients admitted to the hospital, discharge time will be determined by your treatment team.  Please read over the following fact sheets that you were given. Pain Booklet, MRSA Information and Surgical Site Infection Prevention       [facility]- Preparing For Surgery  Before surgery, you can play an important role. Because skin is not sterile, your skin needs to be as free of germs as possible. You can reduce the number of germs on your skin by washing with CHG (chlorahexidine gluconate) Soap before surgery.  CHG is an antiseptic cleaner which kills germs and bonds with the skin to continue killing germs even after washing.  Please do not use  if you have an allergy to CHG or antibacterial soaps. If your skin becomes reddened/irritated stop using the CHG.  Do not shave (including legs and underarms) for at least 48 hours prior to first CHG shower. It is OK to shave your face.  Please follow these instructions carefully.   1. Shower the NIGHT BEFORE SURGERY and the MORNING OF SURGERY with CHG.   2. If you chose to wash your hair, wash your hair first as usual with your normal shampoo.  3. After you shampoo, rinse your hair and body thoroughly to remove the shampoo.  4. Use CHG as you would any other liquid soap. You can apply CHG directly to the skin and wash gently with a scrungie or a clean washcloth.   5. Apply the CHG Soap to your body ONLY FROM THE NECK DOWN.  Do not use on open wounds or open sores. Avoid contact with your eyes, ears, mouth and genitals (private parts). Wash Face and genitals (private parts)  with your normal soap.  6. Wash thoroughly, paying special attention to the area where your surgery will be performed.  7. Thoroughly rinse your body with warm water from the neck down.  8. DO NOT shower/wash with your normal  soap after using and rinsing off the CHG Soap.  9. Pat yourself dry with a CLEAN TOWEL.  10. Wear CLEAN PAJAMAS to bed the night before surgery, wear comfortable clothes the morning of surgery  11. Place CLEAN SHEETS on your bed the night of your first shower and DO NOT SLEEP WITH PETS.    Day of Surgery: Do not apply any deodorants/lotions. Please wear clean clothes to the hospital/surgery center.

## 2017-08-23 NOTE — Progress Notes (Signed)
PCP is Dr. Biagio Quint  LOV 03/2016 He states that some time back his liver enzymes were elevated, and it was determined it came from meds. States he was told as child he had murmur, but has since resolved. Tested positive for OSA some yrs back.  Unable to tolerate the mask, so he doesn't wear it. Will be stopping his aspirin on 08/25/2017

## 2017-08-25 ENCOUNTER — Encounter (HOSPITAL_COMMUNITY): Payer: Self-pay | Admitting: Emergency Medicine

## 2017-08-25 ENCOUNTER — Other Ambulatory Visit: Payer: Self-pay | Admitting: *Deleted

## 2017-08-25 NOTE — Progress Notes (Signed)
Anesthesia Chart Review:  Pt is a 61 year old male scheduled for L5-S1 anterior lumbar interbody fusion, L1-2, L2-3, L3-4, L4-5 anterolateral lumbar interbody fusion, abdominal exposure on 09/01/2017 with Erline Levine, MD and Curt Jews, MD  - PCP is [name], MD  PMH includes:  Heart murmur (as a child), hyperlipidemia, OSA, elevated liver enzymes, scoliosis, GERD. Former smoker. BMI 38. S/p lumbar laminectomy 02/19/16.   Medications include: ASA 81mg , lipitor  Vital signs:  BP (!) 157/104   Pulse 78   Temp 36.7 C (Oral)   Resp 20   Ht 5\' 3"  (1.6 m)   Wt 216 lb (98 kg)   SpO2 97%   BMI 38.26 kg/m  - BP was not rechecked at pre-admission testing, however, an office visit note from Dr. Vertell Limber scanned into the media tab dated 06/14/17 documents BP 164/116.    Preoperative labs reviewed.    EKG 08/23/17:  - Sinus bradycardia (56 bpm). Low voltage QRS. Possible Inferior infarct, age undetermined. Cannot rule out Anterior infarct , age undetermined  Pt will need to see PCP and get untreated HTN addressed prior to surgery.  I notified Janett Billow in Dr. Melven Sartorius office who will also forward EKG to Dr. Caryl Bis for review.   Willeen Cass, FNP-BC [facility] Short Stay Surgical Center/Anesthesiology Phone: [phone] 08/25/2017 9:30 AM

## 2017-08-30 ENCOUNTER — Ambulatory Visit: Payer: 59 | Admitting: Family Medicine

## 2017-08-30 ENCOUNTER — Encounter: Payer: Self-pay | Admitting: Family Medicine

## 2017-08-30 VITALS — BP 158/98 | HR 65 | Temp 97.8°F | Wt 216.8 lb

## 2017-08-30 DIAGNOSIS — I10 Essential (primary) hypertension: Secondary | ICD-10-CM | POA: Diagnosis not present

## 2017-08-30 MED ORDER — HYDROCHLOROTHIAZIDE 12.5 MG PO TABS: 12.5000 mg | ORAL_TABLET | Freq: Every day | ORAL | 3 refills | Status: DC

## 2017-08-30 NOTE — Progress Notes (Signed)
Subjective:    Patient ID: Bobby [name], male    DOB: [birth year], 61 y.o.   MRN: [id number]  HPI  Bobby [name] is a 61 year old male who presents today with elevated blood  pressure. He is scheduled for back surgery with Dr. Vertell Limber on 08/30/17.  He was evaluated 06/14/17 by neurosurgery clinic and BP is documented as 164/116. He was advised to follow up with PCP for BP as surgery will not occur until BP is under control. His preadmission work up on 08/23/17 also found his BP elevated as 157/104. He was advised to see PCP to evaluate and treat BP before surgery. He has not followed up regularly with his PCP. Today, I am seeing this patient in place of his PCP who is unavailable today. BP is elevated at this visit. He denies chest pain, palpitations, SOB, numbness, tingling, weakness, headaches, or edema.  He reports he "feels well" and has not monitored his BP previously. He reports that he does not add salt to his diet but does report that is his overweight. He does not follow a regular exercise program and is a former smoker.  Patient is currently maintained on the following medications for blood pressure: No medications for blood pressure Patient denies chest pain, shortness of breath or swelling. Last 3 blood pressure readings in our office are as follows: BP Readings from Last 3 Encounters:  08/30/17 (!) 158/98  08/23/17 (!) 157/104  08/10/17 (!) 127/92   Last lipid profile was 11/29/2015 which noted mildly elevated triglycerides.  Review of Systems  Constitutional: Negative for chills, fatigue and fever.  Eyes: Negative for visual disturbance.  Respiratory: Negative for cough, shortness of breath and wheezing.   Cardiovascular: Negative for chest pain, palpitations and leg swelling.  Gastrointestinal: Negative for abdominal pain, diarrhea, nausea and vomiting.  Genitourinary: Negative for frequency.  Skin: Negative for rash.  Neurological: Negative for weakness, light-headedness,  numbness and headaches.  Psychiatric/Behavioral:       Denies depressed or anxious mood today   Past Medical History:  Diagnosis Date  . Elevated liver enzymes   . GERD (gastroesophageal reflux disease)   . Heart murmur    as a child only; outgrew it  . History of kidney stones    last attack was in 2016  . Hypercholesterolemia   . Kidney stones   . Osteoarthritis    spine  . Radiculopathy   . Scoliosis   . Sleep apnea    does not wear CPAP since weight loss  . Vitamin D deficiency   . Wears glasses      Social History   Socioeconomic History  . Marital status: Married    Spouse name: Not on file  . Number of children: Not on file  . Years of education: Not on file  . Highest education level: Not on file  Social Needs  . Financial resource strain: Not on file  . Food insecurity - worry: Not on file  . Food insecurity - inability: Not on file  . Transportation needs - medical: Not on file  . Transportation needs - non-medical: Not on file  Occupational History  . Not on file  Tobacco Use  . Smoking status: Former Smoker    Packs/day: 1.00    Years: 15.00    Pack years: 15.00    Types: Cigarettes, Cigars    Last attempt to quit: 08/24/2007    Years since quitting: 10.0  . Smokeless tobacco: Never Used  .  Tobacco comment: " quit smoking around 2007"  Substance and Sexual Activity  . Alcohol use: Yes    Alcohol/week: 0.6 oz    Types: 1 Standard drinks or equivalent per week    Comment: occasional  . Drug use: No  . Sexual activity: Not on file  Other Topics Concern  . Not on file  Social History Narrative  . Not on file    Past Surgical History:  Procedure Laterality Date  . ANTERIOR FUSION CERVICAL SPINE  2002   . BACK SURGERY    . COLONOSCOPY W/ BIOPSIES AND POLYPECTOMY    . LUMBAR LAMINECTOMY/DECOMPRESSION MICRODISCECTOMY Left 02/19/2016   Procedure: Laminectomy and Foraminotomy -Left Lumbar three-four, Lumbar four-five, Lumbar five-Sacral one ;   Surgeon: Ashok Pall, MD;  Location: [facility];  Service: Neurosurgery;  Laterality: Left;    Family History  Problem Relation Age of Onset  . Hypertension Mother   . Kidney disease Mother   . Hyperlipidemia Mother   . Colon cancer Father   . Pancreatitis Unknown   . Diabetes Other     No Known Allergies  Current Outpatient Medications on File Prior to Visit  Medication Sig Dispense Refill  . aspirin 81 MG tablet Take 81 mg by mouth daily.     Marland Kitchen atorvastatin (LIPITOR) 20 MG tablet TAKE 1 TABLET BY MOUTH  DAILY AT 6 PM. 90 tablet 2  . Cholecalciferol (VITAMIN D) 2000 units CAPS Take 2,000 Units by mouth.    . cyanocobalamin 500 MCG tablet Take 500 mcg daily by mouth.    Marland Kitchen tiZANidine (ZANAFLEX) 4 MG tablet Take 1 tablet (4 mg total) by mouth every 6 (six) hours as needed for muscle spasms. 60 tablet 0  . traMADol (ULTRAM) 50 MG tablet Take 1 tablet (50 mg total) by mouth every 6 (six) hours as needed for moderate pain or severe pain. 30 tablet 0   No current facility-administered medications on file prior to visit.     BP (!) 158/98   Pulse 65   Temp 97.8 F (36.6 C) (Oral)   Wt 216 lb 12.8 oz (98.3 kg)   SpO2 98%   BMI 38.40 kg/m       Objective:   Physical Exam  Constitutional: He is oriented to person, place, and time. He appears well-developed and well-nourished.  Eyes: Pupils are equal, round, and reactive to light. No scleral icterus.  Neck: Neck supple.  Cardiovascular: Normal rate, regular rhythm and intact distal pulses.  Pulmonary/Chest: Effort normal and breath sounds normal. He has no wheezes. He has no rales.  Abdominal: Soft. Bowel sounds are normal. There is no tenderness.  Musculoskeletal: He exhibits no edema.  Lymphadenopathy:    He has no cervical adenopathy.  Neurological: He is alert and oriented to person, place, and time. Coordination normal.  Skin: Skin is warm and dry. No rash noted.  Psychiatric: He has a normal mood and affect. His  behavior is normal. Judgment and thought content normal.         Assessment & Plan:  1. Hypertension, essential Blood pressure has been elevated with encounters at neurosurgeon office and also at this office. Patient endorses that he was aware that blood pressure was elevated but has not followed up for further evaluation and treatment. Will initiate HCTZ today and advised patient to monitor blood pressure once daily, document readings, and follow up for further evaluation in two weeks. We also reviewed the importance of monitoring salt in diet, weight loss,  and exercise. Provided DASH diet to patient.  We discussed that I cannot provide surgical clearance for him today with elevated blood pressure and initiation of medication today. We also discussed blood pressure monitoring parameters and he may require an additional medication if blood pressure remains elevated. He wanted to start one medication today, monitor, and follow up. Recent CMP completed for pre surgical work up reviewed; normal results noted. Reviewed goal of blood pressure to be <140/90.  Follow up in 2 weeks with PCP. Also recommended follow up for routine health maintenance is needed.  Delano Metz, FNP-C  - hydrochlorothiazide (HYDRODIURIL) 12.5 MG tablet; Take 1 tablet (12.5 mg total) by mouth daily.  Dispense: 90 tablet; Refill: 3

## 2017-08-30 NOTE — Patient Instructions (Addendum)
Please take medication as directed and monitor your blood pressure once daily. Bring documented blood pressure readings and cuff that you are using at home.  It is also important to continue to monitor salt in your diet and follow up with your provider for further evaluation and treatment of your blood pressure.  Follow up in two weeks.    Hypertension Hypertension is another name for high blood pressure. High blood pressure forces your heart to work harder to pump blood. This can cause problems over time. There are two numbers in a blood pressure reading. There is a top number (systolic) over a bottom number (diastolic). It is best to have a blood pressure below 120/80. Healthy choices can help lower your blood pressure. You may need medicine to help lower your blood pressure if:  Your blood pressure cannot be lowered with healthy choices.  Your blood pressure is higher than 130/80.  Follow these instructions at home: Eating and drinking  If directed, follow the DASH eating plan. This diet includes: ? Filling half of your plate at each meal with fruits and vegetables. ? Filling one quarter of your plate at each meal with whole grains. Whole grains include whole wheat pasta, brown rice, and whole grain bread. ? Eating or drinking low-fat dairy products, such as skim milk or low-fat yogurt. ? Filling one quarter of your plate at each meal with low-fat (lean) proteins. Low-fat proteins include fish, skinless chicken, eggs, beans, and tofu. ? Avoiding fatty meat, cured and processed meat, or chicken with skin. ? Avoiding premade or processed food.  Eat less than 1,500 mg of salt (sodium) a day.  Limit alcohol use to no more than 1 drink a day for nonpregnant women and 2 drinks a day for men. One drink equals 12 oz of beer, 5 oz of wine, or 1 oz of hard liquor. Lifestyle  Work with your doctor to stay at a healthy weight or to lose weight. Ask your doctor what the best weight is for  you.  Get at least 30 minutes of exercise that causes your heart to beat faster (aerobic exercise) most days of the week. This may include walking, swimming, or biking.  Get at least 30 minutes of exercise that strengthens your muscles (resistance exercise) at least 3 days a week. This may include lifting weights or pilates.  Do not use any products that contain nicotine or tobacco. This includes cigarettes and e-cigarettes. If you need help quitting, ask your doctor.  Check your blood pressure at home as told by your doctor.  Keep all follow-up visits as told by your doctor. This is important. Medicines  Take over-the-counter and prescription medicines only as told by your doctor. Follow directions carefully.  Do not skip doses of blood pressure medicine. The medicine does not work as well if you skip doses. Skipping doses also puts you at risk for problems.  Ask your doctor about side effects or reactions to medicines that you should watch for. Contact a doctor if:  You think you are having a reaction to the medicine you are taking.  You have headaches that keep coming back (recurring).  You feel dizzy.  You have swelling in your ankles.  You have trouble with your vision. Get help right away if:  You get a very bad headache.  You start to feel confused.  You feel weak or numb.  You feel faint.  You get very bad pain in your: ? Chest. ? Belly (abdomen).  You throw up (vomit) more than once.  You have trouble breathing. Summary  Hypertension is another name for high blood pressure.  Making healthy choices can help lower blood pressure. If your blood pressure cannot be controlled with healthy choices, you may need to take medicine. This information is not intended to replace advice given to you by your health care provider. Make sure you discuss any questions you have with your health care provider. Document Released: 03/09/2008 Document Revised: 08/19/2016  Document Reviewed: 08/19/2016 Elsevier Interactive Patient Education  2018 [place] Eating Plan DASH stands for "Dietary Approaches to Stop Hypertension." The DASH eating plan is a healthy eating plan that has been shown to reduce high blood pressure (hypertension). It may also reduce your risk for type 2 diabetes, heart disease, and stroke. The DASH eating plan may also help with weight loss. What are tips for following this plan? General guidelines  Avoid eating more than 2,300 mg (milligrams) of salt (sodium) a day. If you have hypertension, you may need to reduce your sodium intake to 1,500 mg a day.  Limit alcohol intake to no more than 1 drink a day for nonpregnant women and 2 drinks a day for men. One drink equals 12 oz of beer, 5 oz of wine, or 1 oz of hard liquor.  Work with your health care provider to maintain a healthy body weight or to lose weight. Ask what an ideal weight is for you.  Get at least 30 minutes of exercise that causes your heart to beat faster (aerobic exercise) most days of the week. Activities may include walking, swimming, or biking.  Work with your health care provider or diet and nutrition specialist (dietitian) to adjust your eating plan to your individual calorie needs. Reading food labels  Check food labels for the amount of sodium per serving. Choose foods with less than 5 percent of the Daily Value of sodium. Generally, foods with less than 300 mg of sodium per serving fit into this eating plan.  To find whole grains, look for the word "whole" as the first word in the ingredient list. Shopping  Buy products labeled as "low-sodium" or "no salt added."  Buy fresh foods. Avoid canned foods and premade or frozen meals. Cooking  Avoid adding salt when cooking. Use salt-free seasonings or herbs instead of table salt or sea salt. Check with your health care provider or pharmacist before using salt substitutes.  Do not fry foods. Cook foods  using healthy methods such as baking, boiling, grilling, and broiling instead.  Cook with heart-healthy oils, such as olive, canola, soybean, or sunflower oil. Meal planning   Eat a balanced diet that includes: ? 5 or more servings of fruits and vegetables each day. At each meal, try to fill half of your plate with fruits and vegetables. ? Up to 6-8 servings of whole grains each day. ? Less than 6 oz of lean meat, poultry, or fish each day. A 3-oz serving of meat is about the same size as a deck of cards. One egg equals 1 oz. ? 2 servings of low-fat dairy each day. ? A serving of nuts, seeds, or beans 5 times each week. ? Heart-healthy fats. Healthy fats called Omega-3 fatty acids are found in foods such as flaxseeds and coldwater fish, like sardines, salmon, and mackerel.  Limit how much you eat of the following: ? Canned or prepackaged foods. ? Food that is high in trans fat, such as fried foods. ?  Food that is high in saturated fat, such as fatty meat. ? Sweets, desserts, sugary drinks, and other foods with added sugar. ? Full-fat dairy products.  Do not salt foods before eating.  Try to eat at least 2 vegetarian meals each week.  Eat more home-cooked food and less restaurant, buffet, and fast food.  When eating at a restaurant, ask that your food be prepared with less salt or no salt, if possible. What foods are recommended? The items listed may not be a complete list. Talk with your dietitian about what dietary choices are best for you. Grains Whole-grain or whole-wheat bread. Whole-grain or whole-wheat pasta. Brown rice. [place]row. Bulgur. Whole-grain and low-sodium cereals. Pita bread. Low-fat, low-sodium crackers. Whole-wheat flour tortillas. Vegetables Fresh or frozen vegetables (raw, steamed, roasted, or grilled). Low-sodium or reduced-sodium tomato and vegetable juice. Low-sodium or reduced-sodium tomato sauce and tomato paste. Low-sodium or reduced-sodium canned  vegetables. Fruits All fresh, dried, or frozen fruit. Canned fruit in natural juice (without added sugar). Meat and other protein foods Skinless chicken or [place]. Ground chicken or [place]. Pork with fat trimmed off. Fish and seafood. Egg whites. Dried beans, peas, or lentils. Unsalted nuts, nut butters, and seeds. Unsalted canned beans. Lean cuts of beef with fat trimmed off. Low-sodium, lean deli meat. Dairy Low-fat (1%) or fat-free (skim) milk. Fat-free, low-fat, or reduced-fat cheeses. Nonfat, low-sodium ricotta or cottage cheese. Low-fat or nonfat yogurt. Low-fat, low-sodium cheese. Fats and oils Soft margarine without trans fats. Vegetable oil. Low-fat, reduced-fat, or light mayonnaise and salad dressings (reduced-sodium). Canola, safflower, olive, soybean, and sunflower oils. Avocado. Seasoning and other foods Herbs. Spices. Seasoning mixes without salt. Unsalted popcorn and pretzels. Fat-free sweets. What foods are not recommended? The items listed may not be a complete list. Talk with your dietitian about what dietary choices are best for you. Grains Baked goods made with fat, such as croissants, muffins, or some breads. Dry pasta or rice meal packs. Vegetables Creamed or fried vegetables. Vegetables in a cheese sauce. Regular canned vegetables (not low-sodium or reduced-sodium). Regular canned tomato sauce and paste (not low-sodium or reduced-sodium). Regular tomato and vegetable juice (not low-sodium or reduced-sodium). Angie Fava. Olives. Fruits Canned fruit in a light or heavy syrup. Fried fruit. Fruit in cream or butter sauce. Meat and other protein foods Fatty cuts of meat. Ribs. Fried meat. Berniece Salines. Sausage. Bologna and other processed lunch meats. Salami. Fatback. Hotdogs. Bratwurst. Salted nuts and seeds. Canned beans with added salt. Canned or smoked fish. Whole eggs or egg yolks. Chicken or [place] with skin. Dairy Whole or 2% milk, cream, and half-and-half. Whole or full-fat  cream cheese. Whole-fat or sweetened yogurt. Full-fat cheese. Nondairy creamers. Whipped toppings. Processed cheese and cheese spreads. Fats and oils Butter. Stick margarine. Lard. Shortening. Ghee. Bacon fat. Tropical oils, such as coconut, palm kernel, or palm oil. Seasoning and other foods Salted popcorn and pretzels. Onion salt, garlic salt, seasoned salt, table salt, and sea salt. Worcestershire sauce. Tartar sauce. Barbecue sauce. Teriyaki sauce. Soy sauce, including reduced-sodium. Steak sauce. Canned and packaged gravies. Fish sauce. Oyster sauce. Cocktail sauce. Horseradish that you find on the shelf. Ketchup. Mustard. Meat flavorings and tenderizers. Bouillon cubes. Hot sauce and Tabasco sauce. Premade or packaged marinades. Premade or packaged taco seasonings. Relishes. Regular salad dressings. Where to find more information:  National Heart, Lung, and [place]: https://wilson-eaton.com/  American Heart Association: www.heart.org Summary  The DASH eating plan is a healthy eating plan that has been shown to reduce high blood  pressure (hypertension). It may also reduce your risk for type 2 diabetes, heart disease, and stroke.  With the DASH eating plan, you should limit salt (sodium) intake to 2,300 mg a day. If you have hypertension, you may need to reduce your sodium intake to 1,500 mg a day.  When on the DASH eating plan, aim to eat more fresh fruits and vegetables, whole grains, lean proteins, low-fat dairy, and heart-healthy fats.  Work with your health care provider or diet and nutrition specialist (dietitian) to adjust your eating plan to your individual calorie needs. This information is not intended to replace advice given to you by your health care provider. Make sure you discuss any questions you have with your health care provider. Document Released: 09/10/2011 Document Revised: 09/14/2016 Document Reviewed: 09/14/2016 Elsevier Interactive Patient Education  2017 [employer].

## 2017-09-01 ENCOUNTER — Encounter (HOSPITAL_COMMUNITY): Admission: RE | Payer: Self-pay | Source: Ambulatory Visit

## 2017-09-01 ENCOUNTER — Inpatient Hospital Stay (HOSPITAL_COMMUNITY): Admission: RE | Admit: 2017-09-01 | Payer: 59 | Source: Ambulatory Visit | Admitting: Neurosurgery

## 2017-09-01 SURGERY — ANTERIOR LUMBAR FUSION 1 LEVEL
Anesthesia: General

## 2017-09-03 ENCOUNTER — Other Ambulatory Visit: Payer: Self-pay | Admitting: Neurosurgery

## 2017-09-03 ENCOUNTER — Encounter (HOSPITAL_COMMUNITY): Admission: RE | Payer: Self-pay | Source: Ambulatory Visit

## 2017-09-03 ENCOUNTER — Inpatient Hospital Stay (HOSPITAL_COMMUNITY): Admission: RE | Admit: 2017-09-03 | Payer: 59 | Source: Ambulatory Visit | Admitting: Neurosurgery

## 2017-09-03 SURGERY — POSTERIOR LUMBAR FUSION 4 LEVEL
Anesthesia: General | Site: Back

## 2017-09-08 ENCOUNTER — Other Ambulatory Visit: Payer: Self-pay | Admitting: *Deleted

## 2017-09-13 ENCOUNTER — Ambulatory Visit: Payer: 59 | Admitting: Family Medicine

## 2017-09-20 ENCOUNTER — Telehealth: Payer: Self-pay

## 2017-09-20 ENCOUNTER — Encounter: Payer: Self-pay | Admitting: Family Medicine

## 2017-09-20 ENCOUNTER — Ambulatory Visit (INDEPENDENT_AMBULATORY_CARE_PROVIDER_SITE_OTHER): Payer: 59 | Admitting: Family Medicine

## 2017-09-20 VITALS — BP 130/90 | HR 66 | Temp 98.0°F | Resp 18 | Ht 64.0 in | Wt 215.5 lb

## 2017-09-20 DIAGNOSIS — I10 Essential (primary) hypertension: Secondary | ICD-10-CM | POA: Diagnosis not present

## 2017-09-20 MED ORDER — AMLODIPINE BESYLATE 2.5 MG PO TABS: 2.5000 mg | ORAL_TABLET | Freq: Every day | ORAL | 3 refills | Status: DC

## 2017-09-20 NOTE — Patient Instructions (Signed)
Blood pressure has improved. Although improved, it is necessary to add another agent to decrease dose to normal range.   Please take medication as directed, continue monitoring, document readings and follow up in 2 weeks with readings so adjustments can be made if needed.  Minimal Blood Pressure Goal= AVERAGE < 140/90; Ideal is an AVERAGE < 135/85. This AVERAGE should be calculated from @ least 5-7 BP readings taken @ different times of day on different days of week. You should not respond to isolated BP readings , but rather the AVERAGE for that week .Please bring your blood pressure cuff to office visits to verify that it is reliable.It can also be checked against the blood pressure device at the pharmacy. Finger or wrist cuffs are not dependable; an arm cuff is.  Focus on dietary choices that are low salt. See DASH diet plan below.   DASH Eating Plan DASH stands for "Dietary Approaches to Stop Hypertension." The DASH eating plan is a healthy eating plan that has been shown to reduce high blood pressure (hypertension). It may also reduce your risk for type 2 diabetes, heart disease, and stroke. The DASH eating plan may also help with weight loss. What are tips for following this plan? General guidelines  Avoid eating more than 2,300 mg (milligrams) of salt (sodium) a day. If you have hypertension, you may need to reduce your sodium intake to 1,500 mg a day.  Limit alcohol intake to no more than 1 drink a day for nonpregnant women and 2 drinks a day for men. One drink equals 12 oz of beer, 5 oz of wine, or 1 oz of hard liquor.  Work with your health care provider to maintain a healthy body weight or to lose weight. Ask what an ideal weight is for you.  Get at least 30 minutes of exercise that causes your heart to beat faster (aerobic exercise) most days of the week. Activities may include walking, swimming, or biking.  Work with your health care provider or diet and nutrition specialist  (dietitian) to adjust your eating plan to your individual calorie needs. Reading food labels  Check food labels for the amount of sodium per serving. Choose foods with less than 5 percent of the Daily Value of sodium. Generally, foods with less than 300 mg of sodium per serving fit into this eating plan.  To find whole grains, look for the word "whole" as the first word in the ingredient list. Shopping  Buy products labeled as "low-sodium" or "no salt added."  Buy fresh foods. Avoid canned foods and premade or frozen meals. Cooking  Avoid adding salt when cooking. Use salt-free seasonings or herbs instead of table salt or sea salt. Check with your health care provider or pharmacist before using salt substitutes.  Do not fry foods. Cook foods using healthy methods such as baking, boiling, grilling, and broiling instead.  Cook with heart-healthy oils, such as olive, canola, soybean, or sunflower oil. Meal planning   Eat a balanced diet that includes: ? 5 or more servings of fruits and vegetables each day. At each meal, try to fill half of your plate with fruits and vegetables. ? Up to 6-8 servings of whole grains each day. ? Less than 6 oz of lean meat, poultry, or fish each day. A 3-oz serving of meat is about the same size as a deck of cards. One egg equals 1 oz. ? 2 servings of low-fat dairy each day. ? A serving of nuts, seeds, or  beans 5 times each week. ? Heart-healthy fats. Healthy fats called Omega-3 fatty acids are found in foods such as flaxseeds and coldwater fish, like sardines, salmon, and mackerel.  Limit how much you eat of the following: ? Canned or prepackaged foods. ? Food that is high in trans fat, such as fried foods. ? Food that is high in saturated fat, such as fatty meat. ? Sweets, desserts, sugary drinks, and other foods with added sugar. ? Full-fat dairy products.  Do not salt foods before eating.  Try to eat at least 2 vegetarian meals each week.  Eat  more home-cooked food and less restaurant, buffet, and fast food.  When eating at a restaurant, ask that your food be prepared with less salt or no salt, if possible. What foods are recommended? The items listed may not be a complete list. Talk with your dietitian about what dietary choices are best for you. Grains Whole-grain or whole-wheat bread. Whole-grain or whole-wheat pasta. Brown rice. [place]. Bulgur. Whole-grain and low-sodium cereals. Pita bread. Low-fat, low-sodium crackers. Whole-wheat flour tortillas. Vegetables Fresh or frozen vegetables (raw, steamed, roasted, or grilled). Low-sodium or reduced-sodium tomato and vegetable juice. Low-sodium or reduced-sodium tomato sauce and tomato paste. Low-sodium or reduced-sodium canned vegetables. Fruits All fresh, dried, or frozen fruit. Canned fruit in natural juice (without added sugar). Meat and other protein foods Skinless chicken or [place]. Ground chicken or [place]. Pork with fat trimmed off. Fish and seafood. Egg whites. Dried beans, peas, or lentils. Unsalted nuts, nut butters, and seeds. Unsalted canned beans. Lean cuts of beef with fat trimmed off. Low-sodium, lean deli meat. Dairy Low-fat (1%) or fat-free (skim) milk. Fat-free, low-fat, or reduced-fat cheeses. Nonfat, low-sodium ricotta or cottage cheese. Low-fat or nonfat yogurt. Low-fat, low-sodium cheese. Fats and oils Soft margarine without trans fats. Vegetable oil. Low-fat, reduced-fat, or light mayonnaise and salad dressings (reduced-sodium). Canola, safflower, olive, soybean, and sunflower oils. Avocado. Seasoning and other foods Herbs. Spices. Seasoning mixes without salt. Unsalted popcorn and pretzels. Fat-free sweets. What foods are not recommended? The items listed may not be a complete list. Talk with your dietitian about what dietary choices are best for you. Grains Baked goods made with fat, such as croissants, muffins, or some breads. Dry pasta or rice meal  packs. Vegetables Creamed or fried vegetables. Vegetables in a cheese sauce. Regular canned vegetables (not low-sodium or reduced-sodium). Regular canned tomato sauce and paste (not low-sodium or reduced-sodium). Regular tomato and vegetable juice (not low-sodium or reduced-sodium). Angie Fava. Olives. Fruits Canned fruit in a light or heavy syrup. Fried fruit. Fruit in cream or butter sauce. Meat and other protein foods Fatty cuts of meat. Ribs. Fried meat. Berniece Salines. Sausage. Bologna and other processed lunch meats. Salami. Fatback. Hotdogs. Bratwurst. Salted nuts and seeds. Canned beans with added salt. Canned or smoked fish. Whole eggs or egg yolks. Chicken or [place] with skin. Dairy Whole or 2% milk, cream, and half-and-half. Whole or full-fat cream cheese. Whole-fat or sweetened yogurt. Full-fat cheese. Nondairy creamers. Whipped toppings. Processed cheese and cheese spreads. Fats and oils Butter. Stick margarine. Lard. Shortening. Ghee. Bacon fat. Tropical oils, such as coconut, palm kernel, or palm oil. Seasoning and other foods Salted popcorn and pretzels. Onion salt, garlic salt, seasoned salt, table salt, and sea salt. Worcestershire sauce. Tartar sauce. Barbecue sauce. Teriyaki sauce. Soy sauce, including reduced-sodium. Steak sauce. Canned and packaged gravies. Fish sauce. Oyster sauce. Cocktail sauce. Horseradish that you find on the shelf. Ketchup. Mustard. Meat flavorings and tenderizers. Bouillon  cubes. Hot sauce and Tabasco sauce. Premade or packaged marinades. Premade or packaged taco seasonings. Relishes. Regular salad dressings. Where to find more information:  National Heart, Lung, and [place]: https://wilson-eaton.com/  American Heart Association: www.heart.org Summary  The DASH eating plan is a healthy eating plan that has been shown to reduce high blood pressure (hypertension). It may also reduce your risk for type 2 diabetes, heart disease, and stroke.  With the DASH eating  plan, you should limit salt (sodium) intake to 2,300 mg a day. If you have hypertension, you may need to reduce your sodium intake to 1,500 mg a day.  When on the DASH eating plan, aim to eat more fresh fruits and vegetables, whole grains, lean proteins, low-fat dairy, and heart-healthy fats.  Work with your health care provider or diet and nutrition specialist (dietitian) to adjust your eating plan to your individual calorie needs. This information is not intended to replace advice given to you by your health care provider. Make sure you discuss any questions you have with your health care provider. Document Released: 09/10/2011 Document Revised: 09/14/2016 Document Reviewed: 09/14/2016 Elsevier Interactive Patient Education  2017 [employer].

## 2017-09-20 NOTE — Telephone Encounter (Signed)
Please advise 

## 2017-09-20 NOTE — Telephone Encounter (Signed)
Noted. Patient will need to be seen in the office to discuss pain medication or contact his surgeon to determine the best regimen. Thanks.

## 2017-09-20 NOTE — Progress Notes (Signed)
Subjective:    Patient ID: Bobby [name], male    DOB: 1956-05-16, 61 y.o.   MRN: [id number]  HPI  Bobby [name] is a 61 year old male who presents today for a blood pressure follow up.  On 08/30/17 he presented with elevated BP that was noted at the neurosurgery clinic during his pre procedure screening as 164/114. Prior to evaluation at the neurosurgery clinic he had not followed up regularly with his PCP and stated that he was here because he could not have surgery with BP that is not controlled.  On 08/30/17 BP was elevated and noted as 158/98. HCTZ 12.5 mg daily was initiated.  He has been monitoring at home and blood pressure monitoring averages are noted as systolic averages of 191Y and diastolic averages of low 78G after initiation of HCTZ on 08/30/17. Reports feeling "well" and denies chest pain, palpitations, SOB, numbness, tingling, weakness, headaches, or edema. No adverse effects are reported from HCTZ. He does not follow a regular exercise program at home and is a former smoker.   He has been adherent with atorvastatin. Last lipid profile was 11/29/15 which noted mildly elevated triglycerides.   Review of Systems  Constitutional: Negative for chills, fatigue and fever.  Eyes: Negative for visual disturbance.  Respiratory: Negative for cough, shortness of breath and wheezing.   Cardiovascular: Negative for chest pain and palpitations.  Endocrine: Negative for polyuria.  Musculoskeletal: Negative for myalgias.  Neurological: Negative for dizziness, weakness, light-headedness and headaches.  Psychiatric/Behavioral:       Denies depressed or anxious mood   Past Medical History:  Diagnosis Date  . Elevated liver enzymes   . GERD (gastroesophageal reflux disease)   . Heart murmur    as a child only; outgrew it  . History of kidney stones    last attack was in 2016  . Hypercholesterolemia   . Kidney stones   . Osteoarthritis    spine  . Radiculopathy   . Scoliosis   .  Sleep apnea    does not wear CPAP since weight loss  . Vitamin D deficiency   . Wears glasses      Social History   Socioeconomic History  . Marital status: Married    Spouse name: Not on file  . Number of children: Not on file  . Years of education: Not on file  . Highest education level: Not on file  Social Needs  . Financial resource strain: Not on file  . Food insecurity - worry: Not on file  . Food insecurity - inability: Not on file  . Transportation needs - medical: Not on file  . Transportation needs - non-medical: Not on file  Occupational History  . Not on file  Tobacco Use  . Smoking status: Former Smoker    Packs/day: 1.00    Years: 15.00    Pack years: 15.00    Types: Cigarettes, Cigars    Last attempt to quit: 08/24/2007    Years since quitting: 10.0  . Smokeless tobacco: Never Used  . Tobacco comment: " quit smoking around 2007"  Substance and Sexual Activity  . Alcohol use: Yes    Alcohol/week: 0.6 oz    Types: 1 Standard drinks or equivalent per week    Comment: occasional  . Drug use: No  . Sexual activity: Not on file  Other Topics Concern  . Not on file  Social History Narrative  . Not on file    Past Surgical History:  Procedure Laterality Date  . ANTERIOR FUSION CERVICAL SPINE  2002   . BACK SURGERY    . COLONOSCOPY W/ BIOPSIES AND POLYPECTOMY    . LUMBAR LAMINECTOMY/DECOMPRESSION MICRODISCECTOMY Left 02/19/2016   Procedure: Laminectomy and Foraminotomy -Left Lumbar three-four, Lumbar four-five, Lumbar five-Sacral one ;  Surgeon: Ashok Pall, MD;  Location: [facility];  Service: Neurosurgery;  Laterality: Left;    Family History  Problem Relation Age of Onset  . Hypertension Mother   . Kidney disease Mother   . Hyperlipidemia Mother   . Colon cancer Father   . Pancreatitis Unknown   . Diabetes Other     No Known Allergies  Current Outpatient Medications on File Prior to Visit  Medication Sig Dispense Refill  . aspirin 81 MG  tablet Take 81 mg by mouth daily.     Marland Kitchen atorvastatin (LIPITOR) 20 MG tablet TAKE 1 TABLET BY MOUTH  DAILY AT 6 PM. 90 tablet 2  . Cholecalciferol (VITAMIN D) 2000 units CAPS Take 2,000 Units by mouth.    . cyanocobalamin 500 MCG tablet Take 500 mcg daily by mouth.    . hydrochlorothiazide (HYDRODIURIL) 12.5 MG tablet Take 1 tablet (12.5 mg total) by mouth daily. 90 tablet 3  . tiZANidine (ZANAFLEX) 4 MG tablet Take 1 tablet (4 mg total) by mouth every 6 (six) hours as needed for muscle spasms. 60 tablet 0  . traMADol (ULTRAM) 50 MG tablet Take 1 tablet (50 mg total) by mouth every 6 (six) hours as needed for moderate pain or severe pain. 30 tablet 0   No current facility-administered medications on file prior to visit.     BP 130/90   Pulse 66   Temp 98 F (36.7 C) (Oral)   Resp 18   Ht 5\' 4"  (1.626 m)   Wt 215 lb 8 oz (97.8 kg)   SpO2 96%   BMI 36.99 kg/m       Objective:   Physical Exam  Constitutional: He is oriented to person, place, and time. He appears well-developed and well-nourished.  Eyes: Pupils are equal, round, and reactive to light. No scleral icterus.  Neck: Neck supple.  Cardiovascular: Normal rate, regular rhythm and intact distal pulses.  Pulmonary/Chest: Effort normal and breath sounds normal. He has no wheezes. He has no rales.  Abdominal: Soft. Bowel sounds are normal. There is no tenderness. There is no rebound.  Musculoskeletal: He exhibits no edema.  Lymphadenopathy:    He has no cervical adenopathy.  Neurological: He is alert and oriented to person, place, and time.  Skin: Skin is warm and dry. No rash noted.  Psychiatric: He has a normal mood and affect. His behavior is normal. Judgment and thought content normal.       Assessment & Plan:  1. Hypertension, essential Blood pressure has improved but diastolic remains elevated and systolic averages from home readings are in the upper 140s. Will continue HCTZ today and add amlodipine 2.5 mg daily as a  second agent.  Advised continued monitoring of BP, document readings, and bring readings and cuff with him to his visit in 2 weeks for further evaluation. Reviewed DASH diet to discuss the importance of monitoring salt in his diet and weight loss.  Provided BP monitoring parameters and he will follow up in 2 weeks as advised.  Advised him to keep routine health maintenance appointments with his PCP to improve his blood pressure outcomes and prevent illness. He has made a CPE appointment with his provider and  has agreed to follow up in 2 weeks regarding his blood pressure readings.  Delano Metz, FNP-C

## 2017-09-20 NOTE — Telephone Encounter (Signed)
Copied from [place] [id number]. Topic: Inquiry >> Sep 20, 2017 12:56 PM Bobby [name], NT wrote: Reason for CRM: wife is calling to let Dr. Caryl Bis nurse know that he is going to hold off on surgery until January and that he is still in super pain and he is wanting something more for pain. Pt is still going to make it to the appt today

## 2017-09-20 NOTE — Telephone Encounter (Signed)
Pt was informed duriing his OV today.

## 2017-09-24 ENCOUNTER — Other Ambulatory Visit: Payer: Self-pay | Admitting: *Deleted

## 2017-10-07 ENCOUNTER — Other Ambulatory Visit: Payer: Self-pay | Admitting: *Deleted

## 2017-10-08 ENCOUNTER — Other Ambulatory Visit (HOSPITAL_COMMUNITY): Payer: Self-pay | Admitting: *Deleted

## 2017-10-08 ENCOUNTER — Encounter (HOSPITAL_COMMUNITY): Payer: Self-pay

## 2017-10-08 NOTE — Pre-Procedure Instructions (Signed)
PHILANDER AKE  10/08/2017    Your procedure is scheduled on Monday, October 18, 2017 at 7:30 AM.   Report to [facility] Entrance "A" Admitting Office at 5:30 AM.   Call this number if you have problems the morning of surgery: [phone]   Questions prior to day of surgery, please call [phone] between 8 & 4 PM.   Remember:  Do not eat food or drink liquids after midnight Sunday, 10/17/17.  Take these medicines the morning of surgery with A SIP OF WATER: Amlodipine (Norvasc), Tramadol - if needed, Tizanidine (Zanaflex) - if needed  Stop Aspirin and NSAIDS (Ibuprofen, Aleve, etc) as of today.   Do not wear jewelry.  Do not wear lotions, powders, perfumes or deodorant.  Men may shave face and neck.  Do not bring valuables to the hospital.  [facility] is not responsible for any belongings or valuables.  Contacts, dentures or bridgework may not be worn into surgery.  Leave your suitcase in the car.  After surgery it may be brought to your room.  For patients admitted to the hospital, discharge time will be determined by your treatment team.  [facility] - Preparing for Surgery  Before surgery, you can play an important role.  Because skin is not sterile, your skin needs to be as free of germs as possible.  You can reduce the number of germs on you skin by washing with CHG (chlorahexidine gluconate) soap before surgery.  CHG is an antiseptic cleaner which kills germs and bonds with the skin to continue killing germs even after washing.  Please DO NOT use if you have an allergy to CHG or antibacterial soaps.  If your skin becomes reddened/irritated stop using the CHG and inform your nurse when you arrive at Short Stay.  Do not shave (including legs and underarms) for at least 48 hours prior to the first CHG shower.  You may shave your face.  Please follow these instructions carefully:   1.  Shower with CHG Soap the night before surgery and the                     morning of Surgery.  2.  If you choose to wash your hair, wash your hair first as usual with your       normal shampoo.  3.  After you shampoo, rinse your hair and body thoroughly to remove the shampoo.  4.  Use CHG as you would any other liquid soap.  You can apply chg directly       to the skin and wash gently with scrungie or a clean washcloth.  5.  Apply the CHG Soap to your body ONLY FROM THE NECK DOWN.        Do not use on open wounds or open sores.  Avoid contact with your eyes, ears, mouth and genitals (private parts).  Wash genitals (private parts) with your normal soap.  6.  Wash thoroughly, paying special attention to the area where your surgery        will be performed.  7.  Thoroughly rinse your body with warm water from the neck down.  8.  DO NOT shower/wash with your normal soap after using and rinsing off       the CHG Soap.  9.  Pat yourself dry with a clean towel.            10 .  Wear clean pajamas.  11.  Place clean sheets on your bed the night of your first shower and do not        sleep with pets.  Day of Surgery  Shower as above. Do not apply any lotions/deodorants the morning of surgery.  Please wear clean clothes to the hospital.   Please read over the fact sheets that you were given.

## 2017-10-11 ENCOUNTER — Encounter (HOSPITAL_COMMUNITY): Payer: Self-pay

## 2017-10-11 ENCOUNTER — Other Ambulatory Visit: Payer: Self-pay

## 2017-10-11 ENCOUNTER — Encounter (HOSPITAL_COMMUNITY)
Admission: RE | Admit: 2017-10-11 | Discharge: 2017-10-11 | Disposition: A | Discharge status: Home / Self Care | Payer: 59 | Source: Ambulatory Visit | Attending: Neurosurgery | Admitting: Neurosurgery

## 2017-10-11 DIAGNOSIS — Z01818 Encounter for other preprocedural examination: Secondary | ICD-10-CM | POA: Diagnosis not present

## 2017-10-11 HISTORY — DX: Essential (primary) hypertension: I10

## 2017-10-11 LAB — CBC
HEMATOCRIT: 47.3 % (ref 39.0–52.0)
Hemoglobin: 15.8 g/dL (ref 13.0–17.0)
MCH: 29.8 pg (ref 26.0–34.0)
MCHC: 33.4 g/dL (ref 30.0–36.0)
MCV: 89.1 fL (ref 78.0–100.0)
PLATELETS: 203 10*3/uL (ref 150–400)
RBC: 5.31 MIL/uL (ref 4.22–5.81)
RDW: 13 % (ref 11.5–15.5)
WBC: 7.2 10*3/uL (ref 4.0–10.5)

## 2017-10-11 LAB — BASIC METABOLIC PANEL
Anion gap: 7 (ref 5–15)
BUN: 17 mg/dL (ref 6–20)
CO2: 29 mmol/L (ref 22–32)
CREATININE: 1.02 mg/dL (ref 0.61–1.24)
Calcium: 9.6 mg/dL (ref 8.9–10.3)
Chloride: 101 mmol/L (ref 101–111)
GFR calc Af Amer: >60 mL/min (ref >60)
GLUCOSE: 98 mg/dL (ref 65–99)
POTASSIUM: 3.9 mmol/L (ref 3.5–5.1)
SODIUM: 137 mmol/L (ref 135–145)

## 2017-10-11 LAB — SURGICAL PCR SCREEN
MRSA, PCR: NEGATIVE
Staphylococcus aureus: NEGATIVE

## 2017-10-11 NOTE — Progress Notes (Addendum)
Pt was seen for PAT appt in November and surgery was cancelled until he got his BP undercontrol. Pt is now on Amlodipine and HCTZ and blood pressure is doing much better. Pt denies any recent chest pain or sob.

## 2017-10-15 ENCOUNTER — Other Ambulatory Visit: Payer: Self-pay | Admitting: *Deleted

## 2017-10-17 NOTE — Anesthesia Preprocedure Evaluation (Addendum)
Anesthesia Evaluation  Patient identified by MRN, date of birth, ID band Patient awake    Reviewed: Allergy & Precautions, NPO status , Patient's Chart, lab work & pertinent test results  Airway Mallampati: II  TM Distance: >3 FB Neck ROM: Full    Dental no notable dental hx.    Pulmonary neg pulmonary ROS, former smoker,    Pulmonary exam normal breath sounds clear to auscultation       Cardiovascular hypertension, negative cardio ROS Normal cardiovascular exam Rhythm:Regular Rate:Normal     Neuro/Psych negative neurological ROS  negative psych ROS   GI/Hepatic negative GI ROS, Neg liver ROS, GERD  ,  Endo/Other  negative endocrine ROS  Renal/GU negative Renal ROS  negative genitourinary   Musculoskeletal negative musculoskeletal ROS (+)   Abdominal   Peds  Hematology negative hematology ROS (+)   Anesthesia Other Findings   Reproductive/Obstetrics                             Lab Results  Component Value Date   CREATININE 1.02 10/11/2017   BUN 17 10/11/2017   NA 137 10/11/2017   K 3.9 10/11/2017   CL 101 10/11/2017   CO2 29 10/11/2017    Anesthesia Physical  Lab Results  Component Value Date   WBC 7.2 10/11/2017   HGB 15.8 10/11/2017   HCT 47.3 10/11/2017   MCV 89.1 10/11/2017   PLT 203 10/11/2017    Anesthesia Plan  ASA: III  Anesthesia Plan: General ETT   Post-op Pain Management:    Induction:   PONV Risk Score and Plan: 2  Airway Management Planned: Oral ETT  Additional Equipment:   Intra-op Plan:   Post-operative Plan: Extubation in OR  Informed Consent: I have reviewed the patients History and Physical, chart, labs and discussed the procedure including the risks, benefits and alternatives for the proposed anesthesia with the patient or authorized representative who has indicated his/her understanding and acceptance.   Dental Advisory Given  Plan  Discussed with: Anesthesiologist and CRNA  Anesthesia Plan Comments:         Anesthesia Quick Evaluation

## 2017-10-18 ENCOUNTER — Inpatient Hospital Stay (HOSPITAL_COMMUNITY): Payer: 59

## 2017-10-18 ENCOUNTER — Inpatient Hospital Stay (HOSPITAL_COMMUNITY): Payer: 59 | Admitting: Anesthesiology

## 2017-10-18 ENCOUNTER — Inpatient Hospital Stay (HOSPITAL_COMMUNITY)
Admission: RE | Admit: 2017-10-18 | Discharge: 2017-10-23 | DRG: 454 | Disposition: A | Discharge status: Rehabilitation Facility | Payer: 59 | Source: Ambulatory Visit | Attending: Neurological Surgery | Admitting: Neurological Surgery

## 2017-10-18 ENCOUNTER — Encounter (HOSPITAL_COMMUNITY)
Admission: RE | Disposition: A | Discharge status: Rehabilitation Facility | Payer: Self-pay | Source: Ambulatory Visit | Attending: Neurosurgery

## 2017-10-18 ENCOUNTER — Encounter (HOSPITAL_COMMUNITY): Payer: Self-pay

## 2017-10-18 DIAGNOSIS — M5115 Intervertebral disc disorders with radiculopathy, thoracolumbar region: Secondary | ICD-10-CM | POA: Diagnosis not present

## 2017-10-18 DIAGNOSIS — Z833 Family history of diabetes mellitus: Secondary | ICD-10-CM | POA: Diagnosis not present

## 2017-10-18 DIAGNOSIS — R32 Unspecified urinary incontinence: Secondary | ICD-10-CM | POA: Diagnosis not present

## 2017-10-18 DIAGNOSIS — M4725 Other spondylosis with radiculopathy, thoracolumbar region: Secondary | ICD-10-CM | POA: Diagnosis not present

## 2017-10-18 DIAGNOSIS — K219 Gastro-esophageal reflux disease without esophagitis: Secondary | ICD-10-CM | POA: Diagnosis present

## 2017-10-18 DIAGNOSIS — X58XXXA Exposure to other specified factors, initial encounter: Secondary | ICD-10-CM | POA: Diagnosis not present

## 2017-10-18 DIAGNOSIS — G4733 Obstructive sleep apnea (adult) (pediatric): Secondary | ICD-10-CM | POA: Diagnosis present

## 2017-10-18 DIAGNOSIS — R609 Edema, unspecified: Secondary | ICD-10-CM | POA: Diagnosis not present

## 2017-10-18 DIAGNOSIS — E669 Obesity, unspecified: Secondary | ICD-10-CM | POA: Diagnosis present

## 2017-10-18 DIAGNOSIS — E7800 Pure hypercholesterolemia, unspecified: Secondary | ICD-10-CM | POA: Diagnosis present

## 2017-10-18 DIAGNOSIS — Z419 Encounter for procedure for purposes other than remedying health state, unspecified: Secondary | ICD-10-CM

## 2017-10-18 DIAGNOSIS — M1990 Unspecified osteoarthritis, unspecified site: Secondary | ICD-10-CM | POA: Diagnosis present

## 2017-10-18 DIAGNOSIS — D62 Acute posthemorrhagic anemia: Secondary | ICD-10-CM | POA: Diagnosis not present

## 2017-10-18 DIAGNOSIS — E8809 Other disorders of plasma-protein metabolism, not elsewhere classified: Secondary | ICD-10-CM | POA: Diagnosis not present

## 2017-10-18 DIAGNOSIS — Z79899 Other long term (current) drug therapy: Secondary | ICD-10-CM

## 2017-10-18 DIAGNOSIS — M4156 Other secondary scoliosis, lumbar region: Secondary | ICD-10-CM | POA: Diagnosis not present

## 2017-10-18 DIAGNOSIS — Z87442 Personal history of urinary calculi: Secondary | ICD-10-CM | POA: Diagnosis not present

## 2017-10-18 DIAGNOSIS — G8918 Other acute postprocedural pain: Secondary | ICD-10-CM | POA: Diagnosis not present

## 2017-10-18 DIAGNOSIS — S3022XA Contusion of scrotum and testes, initial encounter: Secondary | ICD-10-CM | POA: Diagnosis not present

## 2017-10-18 DIAGNOSIS — Z79891 Long term (current) use of opiate analgesic: Secondary | ICD-10-CM

## 2017-10-18 DIAGNOSIS — M4315 Spondylolisthesis, thoracolumbar region: Secondary | ICD-10-CM | POA: Diagnosis not present

## 2017-10-18 DIAGNOSIS — Y92239 Unspecified place in hospital as the place of occurrence of the external cause: Secondary | ICD-10-CM | POA: Diagnosis not present

## 2017-10-18 DIAGNOSIS — Z841 Family history of disorders of kidney and ureter: Secondary | ICD-10-CM | POA: Diagnosis not present

## 2017-10-18 DIAGNOSIS — Z6838 Body mass index (BMI) 38.0-38.9, adult: Secondary | ICD-10-CM

## 2017-10-18 DIAGNOSIS — M4326 Fusion of spine, lumbar region: Secondary | ICD-10-CM | POA: Diagnosis not present

## 2017-10-18 DIAGNOSIS — Z791 Long term (current) use of non-steroidal anti-inflammatories (NSAID): Secondary | ICD-10-CM | POA: Diagnosis not present

## 2017-10-18 DIAGNOSIS — Z8349 Family history of other endocrine, nutritional and metabolic diseases: Secondary | ICD-10-CM | POA: Diagnosis not present

## 2017-10-18 DIAGNOSIS — I10 Essential (primary) hypertension: Secondary | ICD-10-CM | POA: Diagnosis present

## 2017-10-18 DIAGNOSIS — M79652 Pain in left thigh: Secondary | ICD-10-CM | POA: Diagnosis not present

## 2017-10-18 DIAGNOSIS — Z981 Arthrodesis status: Secondary | ICD-10-CM

## 2017-10-18 DIAGNOSIS — E46 Unspecified protein-calorie malnutrition: Secondary | ICD-10-CM | POA: Diagnosis not present

## 2017-10-18 DIAGNOSIS — G9349 Other encephalopathy: Secondary | ICD-10-CM | POA: Diagnosis not present

## 2017-10-18 DIAGNOSIS — Z4789 Encounter for other orthopedic aftercare: Secondary | ICD-10-CM | POA: Diagnosis not present

## 2017-10-18 DIAGNOSIS — M5416 Radiculopathy, lumbar region: Secondary | ICD-10-CM | POA: Diagnosis present

## 2017-10-18 DIAGNOSIS — M4157 Other secondary scoliosis, lumbosacral region: Secondary | ICD-10-CM

## 2017-10-18 DIAGNOSIS — Z7982 Long term (current) use of aspirin: Secondary | ICD-10-CM

## 2017-10-18 DIAGNOSIS — Z87891 Personal history of nicotine dependence: Secondary | ICD-10-CM

## 2017-10-18 DIAGNOSIS — M48061 Spinal stenosis, lumbar region without neurogenic claudication: Secondary | ICD-10-CM

## 2017-10-18 DIAGNOSIS — M5116 Intervertebral disc disorders with radiculopathy, lumbar region: Secondary | ICD-10-CM | POA: Diagnosis not present

## 2017-10-18 DIAGNOSIS — M4805 Spinal stenosis, thoracolumbar region: Secondary | ICD-10-CM | POA: Diagnosis present

## 2017-10-18 DIAGNOSIS — M4185 Other forms of scoliosis, thoracolumbar region: Principal | ICD-10-CM | POA: Diagnosis present

## 2017-10-18 DIAGNOSIS — M4317 Spondylolisthesis, lumbosacral region: Secondary | ICD-10-CM

## 2017-10-18 DIAGNOSIS — R159 Full incontinence of feces: Secondary | ICD-10-CM | POA: Diagnosis not present

## 2017-10-18 DIAGNOSIS — E559 Vitamin D deficiency, unspecified: Secondary | ICD-10-CM | POA: Diagnosis present

## 2017-10-18 DIAGNOSIS — Z8249 Family history of ischemic heart disease and other diseases of the circulatory system: Secondary | ICD-10-CM

## 2017-10-18 DIAGNOSIS — G9340 Encephalopathy, unspecified: Secondary | ICD-10-CM | POA: Diagnosis not present

## 2017-10-18 DIAGNOSIS — Z800 Family history of malignant neoplasm of digestive organs: Secondary | ICD-10-CM

## 2017-10-18 DIAGNOSIS — M5117 Intervertebral disc disorders with radiculopathy, lumbosacral region: Secondary | ICD-10-CM

## 2017-10-18 DIAGNOSIS — K5903 Drug induced constipation: Secondary | ICD-10-CM | POA: Diagnosis not present

## 2017-10-18 DIAGNOSIS — N5089 Other specified disorders of the male genital organs: Secondary | ICD-10-CM | POA: Diagnosis not present

## 2017-10-18 DIAGNOSIS — M419 Scoliosis, unspecified: Secondary | ICD-10-CM | POA: Diagnosis present

## 2017-10-18 DIAGNOSIS — M4125 Other idiopathic scoliosis, thoracolumbar region: Secondary | ICD-10-CM | POA: Diagnosis not present

## 2017-10-18 HISTORY — PX: ANTERIOR LATERAL LUMBAR FUSION 4 LEVELS: SHX5552

## 2017-10-18 HISTORY — PX: ANTERIOR LUMBAR FUSION: SHX1170

## 2017-10-18 HISTORY — PX: ABDOMINAL EXPOSURE: SHX5708

## 2017-10-18 SURGERY — ANTERIOR LUMBAR FUSION 1 LEVEL
Anesthesia: General | Site: Spine Lumbar

## 2017-10-18 MED ORDER — AMLODIPINE BESYLATE 2.5 MG PO TABS
2.5000 mg | ORAL_TABLET | Freq: Every day | ORAL | Status: DC
  Administered 2017-10-22 – 2017-10-23 (×2): 2.5 mg via ORAL
  Filled 2017-10-18 (×3): qty 1

## 2017-10-18 MED ORDER — MIDAZOLAM HCL 2 MG/2ML IJ SOLN
INTRAMUSCULAR | Status: AC
  Filled 2017-10-18: qty 2

## 2017-10-18 MED ORDER — HYDROMORPHONE HCL 1 MG/ML IJ SOLN
INTRAMUSCULAR | Status: AC
  Filled 2017-10-18: qty 1

## 2017-10-18 MED ORDER — PHENYLEPHRINE 40 MCG/ML (10ML) SYRINGE FOR IV PUSH (FOR BLOOD PRESSURE SUPPORT)
PREFILLED_SYRINGE | INTRAVENOUS | Status: AC
  Filled 2017-10-18: qty 10

## 2017-10-18 MED ORDER — CHLORHEXIDINE GLUCONATE CLOTH 2 % EX PADS: 6.0000 | MEDICATED_PAD | Freq: Once | CUTANEOUS | Status: DC

## 2017-10-18 MED ORDER — PROPOFOL 500 MG/50ML IV EMUL
INTRAVENOUS | Status: AC
  Filled 2017-10-18: qty 100

## 2017-10-18 MED ORDER — ACETAMINOPHEN 650 MG RE SUPP: 650.0000 mg | RECTAL | Status: DC | PRN

## 2017-10-18 MED ORDER — MIDAZOLAM HCL 5 MG/5ML IJ SOLN
INTRAMUSCULAR | Status: DC | PRN
  Administered 2017-10-18: 2 mg via INTRAVENOUS

## 2017-10-18 MED ORDER — MEPERIDINE HCL 25 MG/ML IJ SOLN: 6.2500 mg | INTRAMUSCULAR | Status: DC | PRN

## 2017-10-18 MED ORDER — TRAMADOL HCL 50 MG PO TABS
50.0000 mg | ORAL_TABLET | Freq: Four times a day (QID) | ORAL | Status: DC | PRN
  Administered 2017-10-18: 50 mg via ORAL
  Filled 2017-10-18: qty 1

## 2017-10-18 MED ORDER — HYDROCODONE-ACETAMINOPHEN 7.5-325 MG PO TABS: 1.0000 | ORAL_TABLET | Freq: Once | ORAL | Status: DC | PRN

## 2017-10-18 MED ORDER — PHENOL 1.4 % MT LIQD: 1.0000 | OROMUCOSAL | Status: DC | PRN

## 2017-10-18 MED ORDER — DOCUSATE SODIUM 100 MG PO CAPS: 100.0000 mg | ORAL_CAPSULE | Freq: Two times a day (BID) | ORAL | Status: DC

## 2017-10-18 MED ORDER — METHOCARBAMOL 1000 MG/10ML IJ SOLN: 500.0000 mg | Freq: Four times a day (QID) | INTRAVENOUS | Status: DC | PRN

## 2017-10-18 MED ORDER — ROCURONIUM BROMIDE 10 MG/ML (PF) SYRINGE
PREFILLED_SYRINGE | INTRAVENOUS | Status: AC
  Filled 2017-10-18: qty 5

## 2017-10-18 MED ORDER — ALBUMIN HUMAN 5 % IV SOLN
INTRAVENOUS | Status: DC | PRN
  Administered 2017-10-18 (×2): via INTRAVENOUS

## 2017-10-18 MED ORDER — SUFENTANIL CITRATE 50 MCG/ML IV SOLN
INTRAVENOUS | Status: AC
  Filled 2017-10-18: qty 1

## 2017-10-18 MED ORDER — TIZANIDINE HCL 4 MG PO TABS: 4.0000 mg | ORAL_TABLET | Freq: Four times a day (QID) | ORAL | Status: DC | PRN

## 2017-10-18 MED ORDER — KCL IN DEXTROSE-NACL 20-5-0.45 MEQ/L-%-% IV SOLN
INTRAVENOUS | Status: DC
  Administered 2017-10-18: 17:00:00 via INTRAVENOUS

## 2017-10-18 MED ORDER — PHENYLEPHRINE HCL 10 MG/ML IJ SOLN
INTRAMUSCULAR | Status: DC | PRN
  Administered 2017-10-18: 120 ug via INTRAVENOUS
  Administered 2017-10-18 (×5): 80 ug via INTRAVENOUS

## 2017-10-18 MED ORDER — KETAMINE HCL 10 MG/ML IJ SOLN
INTRAMUSCULAR | Status: DC | PRN
  Administered 2017-10-18 (×2): 25 mg via INTRAVENOUS
  Administered 2017-10-18: 10 mg via INTRAVENOUS
  Administered 2017-10-18 (×2): 50 mg via INTRAVENOUS

## 2017-10-18 MED ORDER — CEFAZOLIN SODIUM-DEXTROSE 2-4 GM/100ML-% IV SOLN
INTRAVENOUS | Status: AC
  Filled 2017-10-18: qty 100

## 2017-10-18 MED ORDER — CEFAZOLIN SODIUM-DEXTROSE 2-4 GM/100ML-% IV SOLN: 2.0000 g | INTRAVENOUS | Status: DC

## 2017-10-18 MED ORDER — LIDOCAINE-EPINEPHRINE 1 %-1:100000 IJ SOLN
INTRAMUSCULAR | Status: AC
  Filled 2017-10-18: qty 1

## 2017-10-18 MED ORDER — THROMBIN (RECOMBINANT) 5000 UNITS EX SOLR
CUTANEOUS | Status: AC
  Filled 2017-10-18: qty 10000

## 2017-10-18 MED ORDER — FENTANYL CITRATE (PF) 250 MCG/5ML IJ SOLN
INTRAMUSCULAR | Status: AC
  Filled 2017-10-18: qty 5

## 2017-10-18 MED ORDER — ATORVASTATIN CALCIUM 20 MG PO TABS
20.0000 mg | ORAL_TABLET | Freq: Every day | ORAL | Status: DC
  Administered 2017-10-20 – 2017-10-23 (×4): 20 mg via ORAL
  Filled 2017-10-18 (×4): qty 1

## 2017-10-18 MED ORDER — OXYCODONE HCL 5 MG PO TABS
10.0000 mg | ORAL_TABLET | ORAL | Status: DC | PRN
  Administered 2017-10-18 – 2017-10-19 (×4): 10 mg via ORAL
  Filled 2017-10-18 (×4): qty 2

## 2017-10-18 MED ORDER — SUFENTANIL CITRATE 50 MCG/ML IV SOLN
INTRAVENOUS | Status: DC | PRN
  Administered 2017-10-18: 5 ug via INTRAVENOUS
  Administered 2017-10-18: 25 ug via INTRAVENOUS
  Administered 2017-10-18 (×6): 5 ug via INTRAVENOUS
  Administered 2017-10-18: 10 ug via INTRAVENOUS
  Administered 2017-10-18 (×6): 5 ug via INTRAVENOUS

## 2017-10-18 MED ORDER — LIDOCAINE 2% (20 MG/ML) 5 ML SYRINGE
INTRAMUSCULAR | Status: AC
  Filled 2017-10-18: qty 5

## 2017-10-18 MED ORDER — HYDROCODONE-ACETAMINOPHEN 5-325 MG PO TABS
ORAL_TABLET | ORAL | Status: AC
  Administered 2017-10-18: 1
  Filled 2017-10-18: qty 1

## 2017-10-18 MED ORDER — CEFAZOLIN SODIUM-DEXTROSE 2-4 GM/100ML-% IV SOLN
2.0000 g | Freq: Three times a day (TID) | INTRAVENOUS | Status: AC
  Administered 2017-10-18 – 2017-10-19 (×2): 2 g via INTRAVENOUS
  Filled 2017-10-18 (×2): qty 100

## 2017-10-18 MED ORDER — ACETAMINOPHEN 325 MG PO TABS: 650.0000 mg | ORAL_TABLET | ORAL | Status: DC | PRN

## 2017-10-18 MED ORDER — BUPIVACAINE HCL (PF) 0.5 % IJ SOLN
INTRAMUSCULAR | Status: DC | PRN
  Administered 2017-10-18: 5 mL

## 2017-10-18 MED ORDER — SODIUM CHLORIDE 0.9 % IJ SOLN
INTRAMUSCULAR | Status: AC
  Filled 2017-10-18: qty 10

## 2017-10-18 MED ORDER — FENTANYL CITRATE (PF) 100 MCG/2ML IJ SOLN
INTRAMUSCULAR | Status: DC | PRN
  Administered 2017-10-18: 50 ug via INTRAVENOUS

## 2017-10-18 MED ORDER — SUGAMMADEX SODIUM 200 MG/2ML IV SOLN
INTRAVENOUS | Status: DC | PRN
  Administered 2017-10-18: 200 mg via INTRAVENOUS

## 2017-10-18 MED ORDER — KCL IN DEXTROSE-NACL 20-5-0.45 MEQ/L-%-% IV SOLN
INTRAVENOUS | Status: AC
  Filled 2017-10-18: qty 1000

## 2017-10-18 MED ORDER — POLYETHYLENE GLYCOL 3350 17 G PO PACK: 17.0000 g | PACK | Freq: Every day | ORAL | Status: DC | PRN

## 2017-10-18 MED ORDER — ALUM & MAG HYDROXIDE-SIMETH 200-200-20 MG/5ML PO SUSP: 30.0000 mL | Freq: Four times a day (QID) | ORAL | Status: DC | PRN

## 2017-10-18 MED ORDER — ACETAMINOPHEN 10 MG/ML IV SOLN
INTRAVENOUS | Status: AC
  Filled 2017-10-18: qty 100

## 2017-10-18 MED ORDER — LIDOCAINE-EPINEPHRINE 1 %-1:100000 IJ SOLN
INTRAMUSCULAR | Status: DC | PRN
  Administered 2017-10-18: 5 mL

## 2017-10-18 MED ORDER — MENTHOL 3 MG MT LOZG: 1.0000 | LOZENGE | OROMUCOSAL | Status: DC | PRN

## 2017-10-18 MED ORDER — LIDOCAINE HCL (CARDIAC) 20 MG/ML IV SOLN
INTRAVENOUS | Status: DC | PRN
  Administered 2017-10-18: 60 mg via INTRAVENOUS

## 2017-10-18 MED ORDER — DEXAMETHASONE SODIUM PHOSPHATE 4 MG/ML IJ SOLN
INTRAMUSCULAR | Status: DC | PRN
  Administered 2017-10-18: 10 mg via INTRAVENOUS

## 2017-10-18 MED ORDER — SODIUM CHLORIDE 0.9% FLUSH: 3.0000 mL | Freq: Two times a day (BID) | INTRAVENOUS | Status: DC

## 2017-10-18 MED ORDER — ONDANSETRON HCL 4 MG/2ML IJ SOLN
4.0000 mg | Freq: Four times a day (QID) | INTRAMUSCULAR | Status: DC | PRN
  Administered 2017-10-23: 4 mg via INTRAVENOUS
  Filled 2017-10-18: qty 2

## 2017-10-18 MED ORDER — ONDANSETRON HCL 4 MG PO TABS
4.0000 mg | ORAL_TABLET | Freq: Four times a day (QID) | ORAL | Status: DC | PRN
  Administered 2017-10-19: 4 mg via ORAL
  Filled 2017-10-18: qty 1

## 2017-10-18 MED ORDER — PHENYLEPHRINE HCL 10 MG/ML IJ SOLN
INTRAVENOUS | Status: DC | PRN
  Administered 2017-10-18: 40 ug/min via INTRAVENOUS

## 2017-10-18 MED ORDER — PANTOPRAZOLE SODIUM 40 MG IV SOLR
40.0000 mg | Freq: Every day | INTRAVENOUS | Status: DC
  Administered 2017-10-19: 40 mg via INTRAVENOUS
  Filled 2017-10-18: qty 40

## 2017-10-18 MED ORDER — ACETAMINOPHEN 10 MG/ML IV SOLN
INTRAVENOUS | Status: DC | PRN
  Administered 2017-10-18: 1000 mg via INTRAVENOUS

## 2017-10-18 MED ORDER — PROMETHAZINE HCL 25 MG/ML IJ SOLN: 6.2500 mg | INTRAMUSCULAR | Status: DC | PRN

## 2017-10-18 MED ORDER — LACTATED RINGERS IV SOLN
INTRAVENOUS | Status: DC | PRN
  Administered 2017-10-18 (×2): via INTRAVENOUS

## 2017-10-18 MED ORDER — KETAMINE HCL-SODIUM CHLORIDE 100-0.9 MG/10ML-% IV SOSY
PREFILLED_SYRINGE | INTRAVENOUS | Status: AC
Start: 2017-10-18 — End: 2017-10-18
  Filled 2017-10-18: qty 10

## 2017-10-18 MED ORDER — SODIUM CHLORIDE 0.9 % IV SOLN: 250.0000 mL | INTRAVENOUS | Status: DC

## 2017-10-18 MED ORDER — FLEET ENEMA 7-19 GM/118ML RE ENEM: 1.0000 | ENEMA | Freq: Once | RECTAL | Status: DC | PRN

## 2017-10-18 MED ORDER — ACETAMINOPHEN 10 MG/ML IV SOLN: 1000.0000 mg | Freq: Once | INTRAVENOUS | Status: DC | PRN

## 2017-10-18 MED ORDER — BISACODYL 10 MG RE SUPP: 10.0000 mg | Freq: Every day | RECTAL | Status: DC | PRN

## 2017-10-18 MED ORDER — LACTATED RINGERS IV SOLN
INTRAVENOUS | Status: DC | PRN
Start: 2017-10-18 — End: 2017-10-18
  Administered 2017-10-18: 08:00:00 via INTRAVENOUS

## 2017-10-18 MED ORDER — OXYCODONE HCL 5 MG PO TABS: 5.0000 mg | ORAL_TABLET | ORAL | Status: DC | PRN

## 2017-10-18 MED ORDER — SODIUM CHLORIDE 0.9% FLUSH: 3.0000 mL | INTRAVENOUS | Status: DC | PRN

## 2017-10-18 MED ORDER — ASPIRIN 81 MG PO CHEW
81.0000 mg | CHEWABLE_TABLET | Freq: Every day | ORAL | Status: DC
  Administered 2017-10-18 – 2017-10-23 (×4): 81 mg via ORAL
  Filled 2017-10-18 (×4): qty 1

## 2017-10-18 MED ORDER — EPHEDRINE SULFATE 50 MG/ML IJ SOLN
INTRAMUSCULAR | Status: DC | PRN
  Administered 2017-10-18: 5 mg via INTRAVENOUS
  Administered 2017-10-18 (×2): 10 mg via INTRAVENOUS
  Administered 2017-10-18: 2.5 mg via INTRAVENOUS
  Administered 2017-10-18: 10 mg via INTRAVENOUS
  Administered 2017-10-18: 5 mg via INTRAVENOUS
  Administered 2017-10-18: 2.5 mg via INTRAVENOUS
  Administered 2017-10-18: 5 mg via INTRAVENOUS

## 2017-10-18 MED ORDER — GABAPENTIN 300 MG PO CAPS
300.0000 mg | ORAL_CAPSULE | Freq: Three times a day (TID) | ORAL | Status: DC
  Administered 2017-10-18 – 2017-10-23 (×12): 300 mg via ORAL
  Filled 2017-10-18 (×12): qty 1

## 2017-10-18 MED ORDER — PROPOFOL 10 MG/ML IV BOLUS
INTRAVENOUS | Status: AC
  Filled 2017-10-18: qty 20

## 2017-10-18 MED ORDER — METHOCARBAMOL 500 MG PO TABS
500.0000 mg | ORAL_TABLET | Freq: Four times a day (QID) | ORAL | Status: DC | PRN
  Administered 2017-10-18 – 2017-10-23 (×9): 500 mg via ORAL
  Filled 2017-10-18 (×9): qty 1

## 2017-10-18 MED ORDER — MORPHINE SULFATE (PF) 4 MG/ML IV SOLN
2.0000 mg | INTRAVENOUS | Status: DC | PRN
  Administered 2017-10-19: 2 mg via INTRAVENOUS
  Filled 2017-10-18: qty 1

## 2017-10-18 MED ORDER — EPHEDRINE 5 MG/ML INJ
INTRAVENOUS | Status: AC
Start: 2017-10-18 — End: 2017-10-18
  Filled 2017-10-18: qty 10

## 2017-10-18 MED ORDER — HYDROMORPHONE HCL 1 MG/ML IJ SOLN
0.2500 mg | INTRAMUSCULAR | Status: DC | PRN
  Administered 2017-10-18 (×4): 0.5 mg via INTRAVENOUS

## 2017-10-18 MED ORDER — PROPOFOL 500 MG/50ML IV EMUL
INTRAVENOUS | Status: DC | PRN
  Administered 2017-10-18: 20 ug/kg/min via INTRAVENOUS

## 2017-10-18 MED ORDER — ROCURONIUM BROMIDE 100 MG/10ML IV SOLN
INTRAVENOUS | Status: DC | PRN
  Administered 2017-10-18: 60 mg via INTRAVENOUS
  Administered 2017-10-18: 5 mg via INTRAVENOUS

## 2017-10-18 MED ORDER — ASPIRIN 81 MG PO TABS: 81.0000 mg | ORAL_TABLET | Freq: Every day | ORAL | Status: DC

## 2017-10-18 MED ORDER — HYDROCHLOROTHIAZIDE 25 MG PO TABS
12.5000 mg | ORAL_TABLET | Freq: Every day | ORAL | Status: DC
  Administered 2017-10-18 – 2017-10-23 (×4): 12.5 mg via ORAL
  Filled 2017-10-18 (×4): qty 1

## 2017-10-18 MED ORDER — BUPIVACAINE HCL (PF) 0.5 % IJ SOLN
INTRAMUSCULAR | Status: AC
  Filled 2017-10-18: qty 30

## 2017-10-18 MED ORDER — 0.9 % SODIUM CHLORIDE (POUR BTL) OPTIME
TOPICAL | Status: DC | PRN
  Administered 2017-10-18: 1000 mL

## 2017-10-18 MED ORDER — HYDROMORPHONE HCL 1 MG/ML IJ SOLN
0.5000 mg | INTRAMUSCULAR | Status: DC | PRN
  Administered 2017-10-18 (×2): 0.5 mg via INTRAVENOUS

## 2017-10-18 MED ORDER — ZOLPIDEM TARTRATE 5 MG PO TABS
5.0000 mg | ORAL_TABLET | Freq: Every evening | ORAL | Status: DC | PRN
  Administered 2017-10-18: 5 mg via ORAL
  Filled 2017-10-18: qty 1

## 2017-10-18 MED ORDER — KETAMINE HCL-SODIUM CHLORIDE 100-0.9 MG/10ML-% IV SOSY
PREFILLED_SYRINGE | INTRAVENOUS | Status: AC
  Filled 2017-10-18: qty 10

## 2017-10-18 MED ORDER — PROPOFOL 10 MG/ML IV BOLUS
INTRAVENOUS | Status: DC | PRN
  Administered 2017-10-18: 170 mg via INTRAVENOUS

## 2017-10-18 MED ORDER — CEFAZOLIN SODIUM-DEXTROSE 2-4 GM/100ML-% IV SOLN
2.0000 g | INTRAVENOUS | Status: AC
  Administered 2017-10-18 (×2): 2 g via INTRAVENOUS

## 2017-10-18 SURGICAL SUPPLY — 113 items
APPLIER CLIP 11 MED OPEN (CLIP) ×5
BASE TI BOLT 5.0X22.5 VARIABLE (Bolt) ×5 IMPLANT
BASE TI HL 10X42X30 20D (Bolt) ×4 IMPLANT
BASE TI HL 10X42X30MM 20D (Bolt) ×1 IMPLANT
BASKET BONE COLLECTION (BASKET) IMPLANT
BLADE CLIPPER SURG (BLADE) ×15 IMPLANT
BOLT BASE TI 5X20 VARIABLE (Bolt) ×10 IMPLANT
BUR BARREL STRAIGHT FLUTE 4.0 (BURR) IMPLANT
CAGE MODULUS XLW 12X22X55 - 15 (Cage) ×5 IMPLANT
CANISTER SUCT 3000ML PPV (MISCELLANEOUS) ×5 IMPLANT
CARTRIDGE OIL MAESTRO DRILL (MISCELLANEOUS) IMPLANT
CLIP APPLIE 11 MED OPEN (CLIP) ×3 IMPLANT
CLIP LIGATING EXTRA MED SLVR (CLIP) IMPLANT
CLIP LIGATING EXTRA SM BLUE (MISCELLANEOUS) IMPLANT
COVER BACK TABLE 24X17X13 BIG (DRAPES) IMPLANT
COVER BACK TABLE 60X90IN (DRAPES) ×5 IMPLANT
DECANTER SPIKE VIAL GLASS SM (MISCELLANEOUS) ×5 IMPLANT
DERMABOND ADVANCED (GAUZE/BANDAGES/DRESSINGS) ×6
DERMABOND ADVANCED .7 DNX12 (GAUZE/BANDAGES/DRESSINGS) ×9 IMPLANT
DIFFUSER DRILL AIR PNEUMATIC (MISCELLANEOUS) IMPLANT
DRAPE C-ARM 42X72 X-RAY (DRAPES) ×10 IMPLANT
DRAPE C-ARMOR (DRAPES) ×10 IMPLANT
DRAPE LAPAROTOMY 100X72X124 (DRAPES) ×10 IMPLANT
DRAPE POUCH INSTRU U-SHP 10X18 (DRAPES) ×10 IMPLANT
DRSG OPSITE POSTOP 3X4 (GAUZE/BANDAGES/DRESSINGS) ×5 IMPLANT
DRSG OPSITE POSTOP 4X6 (GAUZE/BANDAGES/DRESSINGS) ×10 IMPLANT
DURAPREP 26ML APPLICATOR (WOUND CARE) ×10 IMPLANT
ELECT BLADE 4.0 EZ CLEAN MEGAD (MISCELLANEOUS) ×10
ELECT CAUTERY BLADE 6.4 (BLADE) ×5 IMPLANT
ELECT REM PT RETURN 9FT ADLT (ELECTROSURGICAL) ×10
ELECTRODE BLDE 4.0 EZ CLN MEGD (MISCELLANEOUS) ×6 IMPLANT
ELECTRODE REM PT RTRN 9FT ADLT (ELECTROSURGICAL) ×6 IMPLANT
GAUZE SPONGE 4X4 12PLY STRL (GAUZE/BANDAGES/DRESSINGS) IMPLANT
GAUZE SPONGE 4X4 16PLY XRAY LF (GAUZE/BANDAGES/DRESSINGS) IMPLANT
GLOVE BIO SURGEON STRL SZ8 (GLOVE) ×25 IMPLANT
GLOVE BIOGEL PI IND STRL 6.5 (GLOVE) ×6 IMPLANT
GLOVE BIOGEL PI IND STRL 7.5 (GLOVE) ×3 IMPLANT
GLOVE BIOGEL PI IND STRL 8 (GLOVE) ×12 IMPLANT
GLOVE BIOGEL PI IND STRL 8.5 (GLOVE) ×6 IMPLANT
GLOVE BIOGEL PI INDICATOR 6.5 (GLOVE) ×4
GLOVE BIOGEL PI INDICATOR 7.5 (GLOVE) ×2
GLOVE BIOGEL PI INDICATOR 8 (GLOVE) ×8
GLOVE BIOGEL PI INDICATOR 8.5 (GLOVE) ×4
GLOVE ECLIPSE 8.0 STRL XLNG CF (GLOVE) ×25 IMPLANT
GLOVE EXAM NITRILE LRG STRL (GLOVE) IMPLANT
GLOVE EXAM NITRILE XL STR (GLOVE) IMPLANT
GLOVE EXAM NITRILE XS STR PU (GLOVE) IMPLANT
GLOVE INDICATOR 8.5 STRL (GLOVE) ×5 IMPLANT
GLOVE SS BIOGEL STRL SZ 7.5 (GLOVE) ×3 IMPLANT
GLOVE SUPERSENSE BIOGEL SZ 7.5 (GLOVE) ×2
GLOVE SURG SS PI 6.5 STRL IVOR (GLOVE) ×15 IMPLANT
GLOVE SURG SS PI 7.5 STRL IVOR (GLOVE) ×25 IMPLANT
GOWN STRL REUS W/ TWL LRG LVL3 (GOWN DISPOSABLE) ×15 IMPLANT
GOWN STRL REUS W/ TWL XL LVL3 (GOWN DISPOSABLE) ×9 IMPLANT
GOWN STRL REUS W/TWL 2XL LVL3 (GOWN DISPOSABLE) ×15 IMPLANT
GOWN STRL REUS W/TWL LRG LVL3 (GOWN DISPOSABLE) ×10
GOWN STRL REUS W/TWL XL LVL3 (GOWN DISPOSABLE) ×6
INSERT FOGARTY 61MM (MISCELLANEOUS) IMPLANT
INSERT FOGARTY SM (MISCELLANEOUS) IMPLANT
KIT BASIN OR (CUSTOM PROCEDURE TRAY) ×5 IMPLANT
KIT DILATOR XLIF 5 (KITS) ×3 IMPLANT
KIT INFUSE X SMALL 1.4CC (Orthopedic Implant) ×5 IMPLANT
KIT INFUSE XX SMALL 0.7CC (Orthopedic Implant) ×5 IMPLANT
KIT ROOM TURNOVER OR (KITS) ×5 IMPLANT
KIT SURGICAL ACCESS MAXCESS 4 (KITS) ×5 IMPLANT
KIT XLIF (KITS) ×2
LOOP VESSEL MAXI BLUE (MISCELLANEOUS) ×10 IMPLANT
LOOP VESSEL MINI RED (MISCELLANEOUS) IMPLANT
MARKER SKIN DUAL TIP RULER LAB (MISCELLANEOUS) ×5 IMPLANT
MODULE NVM5 NEXT GEN EMG (NEEDLE) ×5 IMPLANT
MODULUS XLW 10X22X50MM 10DEG (Spine Construct) ×5 IMPLANT
MODULUS XLW 10X22X55MM 10 (Spine Construct) ×5 IMPLANT
NEEDLE HYPO 25X1 1.5 SAFETY (NEEDLE) ×5 IMPLANT
NEEDLE SPNL 18GX3.5 QUINCKE PK (NEEDLE) ×5 IMPLANT
NS IRRIG 1000ML POUR BTL (IV SOLUTION) ×5 IMPLANT
OIL CARTRIDGE MAESTRO DRILL (MISCELLANEOUS)
PACK LAMINECTOMY NEURO (CUSTOM PROCEDURE TRAY) ×10 IMPLANT
PAD ARMBOARD 7.5X6 YLW CONV (MISCELLANEOUS) ×40 IMPLANT
PENCIL BUTTON HOLSTER BLD 10FT (ELECTRODE) ×5 IMPLANT
PUTTY BONE ATTRAX 10CC STRIP (Putty) ×10 IMPLANT
PUTTY BONE ATTRAX 5CC STRIP (Putty) ×5 IMPLANT
SPONGE INTESTINAL PEANUT (DISPOSABLE) ×10 IMPLANT
SPONGE LAP 18X18 X RAY DECT (DISPOSABLE) ×5 IMPLANT
SPONGE LAP 4X18 X RAY DECT (DISPOSABLE) ×10 IMPLANT
SPONGE SURGIFOAM ABS GEL SZ50 (HEMOSTASIS) IMPLANT
STAPLER SKIN PROX WIDE 3.9 (STAPLE) IMPLANT
STAPLER VISISTAT 35W (STAPLE) IMPLANT
SUT PDS AB 1 CTX 36 (SUTURE) IMPLANT
SUT PROLENE 4 0 RB 1 (SUTURE)
SUT PROLENE 4-0 RB1 .5 CRCL 36 (SUTURE) IMPLANT
SUT PROLENE 5 0 CC1 (SUTURE) IMPLANT
SUT PROLENE 6 0 C 1 30 (SUTURE) ×5 IMPLANT
SUT PROLENE 6 0 CC (SUTURE) IMPLANT
SUT SILK 0 TIES 10X30 (SUTURE) ×5 IMPLANT
SUT SILK 2 0 TIES 10X30 (SUTURE) ×5 IMPLANT
SUT SILK 2 0 TIES 17X18 (SUTURE) ×2
SUT SILK 2 0SH CR/8 30 (SUTURE) IMPLANT
SUT SILK 2-0 18XBRD TIE BLK (SUTURE) ×3 IMPLANT
SUT SILK 3 0 TIES 10X30 (SUTURE) ×5 IMPLANT
SUT SILK 3 0SH CR/8 30 (SUTURE) IMPLANT
SUT VIC AB 1 CT1 18XBRD ANBCTR (SUTURE) ×3 IMPLANT
SUT VIC AB 1 CT1 8-18 (SUTURE) ×2
SUT VIC AB 2-0 CT1 18 (SUTURE) ×15 IMPLANT
SUT VIC AB 2-0 CT1 36 (SUTURE) IMPLANT
SUT VIC AB 2-0 CTX 36 (SUTURE) ×5 IMPLANT
SUT VIC AB 3-0 SH 27 (SUTURE)
SUT VIC AB 3-0 SH 27X BRD (SUTURE) IMPLANT
SUT VIC AB 3-0 SH 8-18 (SUTURE) ×20 IMPLANT
SUT VICRYL 4-0 PS2 18IN ABS (SUTURE) IMPLANT
TOWEL GREEN STERILE (TOWEL DISPOSABLE) ×15 IMPLANT
TOWEL GREEN STERILE FF (TOWEL DISPOSABLE) ×10 IMPLANT
TRAY FOLEY W/METER SILVER 16FR (SET/KITS/TRAYS/PACK) ×5 IMPLANT
WATER STERILE IRR 1000ML POUR (IV SOLUTION) ×5 IMPLANT

## 2017-10-18 NOTE — Interval H&P Note (Signed)
History and Physical Interval Note:  10/18/2017 7:31 AM  Bobby [name]  has presented today for surgery, with the diagnosis of Scoliosis due to degenerative disease of spine  The various methods of treatment have been discussed with the patient and family. After consideration of risks, benefits and other options for treatment, the patient has consented to  Procedure(s) with comments: L5-S1 Anterior lumbar interbody fusion with Dr. Sherren Mocha Early (N/A) - L5-S1 Anterior lumbar interbody fusion with Dr. Sherren Mocha Early Left L1-2 L2-3 L3-4 L4-5 Anterolateral lumbar interbody fusion (Left) - Left L1-2 L2-3 L3-4 L4-5 Anterolateral lumbar interbody fusion ABDOMINAL EXPOSURE (N/A) as a surgical intervention .  The patient's history has been reviewed, patient examined, no change in status, stable for surgery.  I have reviewed the patient's chart and labs.  Questions were answered to the patient's satisfaction.     [name] D

## 2017-10-18 NOTE — Op Note (Signed)
    OPERATIVE REPORT  DATE OF SURGERY: 10/18/2017  PATIENT: Bobby [name], 62 y.o. male MRN: [id number]  DOB: [birth year]  PRE-OPERATIVE DIAGNOSIS: Degenerative disc disease  POST-OPERATIVE DIAGNOSIS:  Same  PROCEDURE: Anterior exposure for L5-S1 disc fusion  SURGEON:  Curt Jews, M.D.  Co-surgeon for the exposure Dr. Dierdre Harness  PHYSICIAN ASSISTANT: Humberto Seals RN  ANESTHESIA: General  EBL: 150 ml  Total I/O In: 2050 [I.V.:2050] Out: 310 [Urine:160; Blood:150]  BLOOD ADMINISTERED: None  DRAINS: None  SPECIMEN: None  COUNTS CORRECT:  YES  PLAN OF CARE: Posterior approach for additional back surgery  PATIENT DISPOSITION:  PACU - hemodynamically stable  PROCEDURE DETAILS: The patient was taken to the operating placed in supine position where the area of the.  Cross table lateral C arm was used to identify the level of the L5-S1 disc on the anterior abdominal wall.  The abdomen was prepped and draped in usual sterile fashion.  An incision was made from the level of the midline transversely to the left and carried down through the subcutaneous fat with electrocautery.  The anterior rectus sheath was opened in line with the skin incision with electrocautery.  The rectus muscle was mobilized circumferentially.  The posterior sheath was opened laterally.  The retroperitoneum was entered bluntly to the lateral of the rectus muscles.  Intraperitoneal contents and left ureter were mobilized to the right.  Blunt dissection was continued down to the level of the L5-S1 disc.  Blunt dissection was continued to the right and left of the disc.  The middle sacral vessels were clipped and divided.  The reverse lip 200 mm blade was positioned to the right of the L5-S1 disc and the reverse lip 150 blade was positioned to the left.  Malleable retractors were used for superior and inferior exposure.  A needle was placed in the L5-S1 disc and C-arm was brought back onto the field to confirm that  this was the appropriate level.  The remainder of the procedure will be dictated as a separate note by Dr. Gari Crown, M.D., [facility] 10/18/2017 10:42 AM

## 2017-10-18 NOTE — Progress Notes (Signed)
Awake, alert, conversant.  MAEW with full power.  Doing well.  

## 2017-10-18 NOTE — Anesthesia Postprocedure Evaluation (Signed)
Anesthesia Post Note  Patient: Bobby [name]  Procedure(s) Performed: Lumbar Five-Sacral One Anterior lumbar interbody fusion with Dr. Sherren Mocha Early (N/A Spine Lumbar) Left Lumbar One-Two,Lumbar Two-Three,Lumbar Three-Four Anterolateral lumbar interbody fusion (Left Spine Lumbar) ABDOMINAL EXPOSURE (N/A Abdomen)     Patient location during evaluation: PACU Anesthesia Type: General Level of consciousness: awake and alert Pain management: pain level controlled Vital Signs Assessment: post-procedure vital signs reviewed and stable Respiratory status: spontaneous breathing, nonlabored ventilation, respiratory function stable and patient connected to nasal cannula oxygen Cardiovascular status: blood pressure returned to baseline and stable Postop Assessment: no apparent nausea or vomiting Anesthetic complications: no    Last Vitals:  Vitals:   10/18/17 1445 10/18/17 1500  BP: 125/84 117/75  Pulse: (!) 106 (!) 101  Resp: 16 18  Temp:    SpO2: 98% 100%    Last Pain:  Vitals:   10/18/17 1500  TempSrc:   PainSc: 9     LLE Motor Response: Responds to commands;Purposeful movement (10/18/17 1500) LLE Sensation: Numbness(old prior to surgery) (10/18/17 1500) RLE Motor Response: Responds to commands;Purposeful movement (10/18/17 1500) RLE Sensation: Numbness(old from before surgery) (10/18/17 1500)      Barnet Glasgow

## 2017-10-18 NOTE — Procedures (Signed)
Called to PACU to place pt on vent.  On arrival pt placed on PS 8/5 per MD, tolerating well. Pt very awake, VT's >1L.  Pt approximately on vent X3 minutes before CRNA removed tube.  Pt tolerated well

## 2017-10-18 NOTE — Anesthesia Procedure Notes (Addendum)
Procedure Name: Intubation Date/Time: 10/18/2017 7:50 AM Performed by: Izora Gala, CRNA Pre-anesthesia Checklist: Patient identified, Emergency Drugs available, Suction available and Patient being monitored Patient Re-evaluated:Patient Re-evaluated prior to induction Oxygen Delivery Method: Circle System Utilized Preoxygenation: Pre-oxygenation with 100% oxygen Induction Type: IV induction Ventilation: Oral airway inserted - appropriate to patient size, Two handed mask ventilation required and Mask ventilation with difficulty Laryngoscope Size: Mac and 4 Grade View: Grade II Tube type: Oral Tube size: 7.5 mm Number of attempts: 1 Airway Equipment and Method: Stylet and Oral airway Placement Confirmation: ETT inserted through vocal cords under direct vision,  positive ETCO2 and breath sounds checked- equal and bilateral Secured at: 21 cm Tube secured with: Tape Dental Injury: Teeth and Oropharynx as per pre-operative assessment  Comments: Performed by: Sherie Don, SRNA Anterior cricoid pressure applied for view attainment; no complications; pt. Tolerated well.

## 2017-10-18 NOTE — H&P (View-Only) (Signed)
Awake, alert, conversant.  MAEW with full power.  Doing well.  

## 2017-10-18 NOTE — Op Note (Signed)
10/18/2017  1:14 PM  PATIENT:  Bobby [name]  62 y.o. male  PRE-OPERATIVE DIAGNOSIS:  Thoracolumbar Scoliosis due to degenerative disease of spine with spondylolisthesis, stenosis, HNP, radiculopathy, lumbago  POST-OPERATIVE DIAGNOSIS:  Thoracolumbar Scoliosis due to degenerative disease of spine with spondylolisthesis, stenosis, HNP, radiculopathy, lumbago  PROCEDURE:  Procedure(s) with comments: Lumbar Five-Sacral One Anterior lumbar interbody fusion with Dr. Sherren Mocha Early (N/A) - Part-1 Abdominal approach Left Lumbar One-Two,Lumbar Two-Three,Lumbar Three-Four Anterolateral lumbar interbody fusion (Left) - anterolateral approach Part-2 ABDOMINAL EXPOSURE (N/A) - Part-1  SURGEON:  Surgeon(s) and Role: Panel 1:    Erline Levine, MD - Primary    * Kary Kos, MD - Assisting Panel 2:    * Early, Arvilla Meres, MD - Primary  PHYSICIAN ASSISTANT:   ASSISTANTS: Poteat, RN   ANESTHESIA:   general  EBL:  225 mL   BLOOD ADMINISTERED:none  DRAINS: none   LOCAL MEDICATIONS USED:  MARCAINE    and LIDOCAINE   SPECIMEN:  No Specimen  DISPOSITION OF SPECIMEN:  N/A  COUNTS:  YES  TOURNIQUET:  * No tourniquets in log *  DICTATION: DICTATION:   INDICATIONS:  [name] is 62 year old male with chronic and intractable back and bilateral lower extremity pain with fixed coronal and sagittal spinal deformities, who has previously undergone posterior decompression with left laminoforaminotomies at L 34, L 45, L 5 S1 levels with persistent and intractable left greater than right lower extremity pain.  It was elected to take him to surgery for anterior lumbar decompression and fusion at the L 5 S1 level.   and to place a hyperlordotic ALIF cage at L 5 S 1 and then to perform left sided XLIF at L 12, L 23, L 34, L 45 levels.  PROCEDURE:  Doctor Early performed exposure and his portion of the procedure will be dictated separately.  Upon exposing the L 5 S1 level, a localizing X ray was obtained with the  C arm.  I then incised the anterior annulus and performed a thorough discectomy with wide ligamentous releases.  The endplates were cleared of disc and cartilagenous material and a thorough discectomy was performed with decompression of the ventral annulus and disc material.  After trials, a 20 degree, 10 x 42 x 20 Base Hyperlordotic ALIF cage was placed and lagged with 3, 5.0 x 22.5 and 20 mm screws, one 20 mm screw in L 5 and one of each in S 1. This was a  titanium spacer which was selected, packed with extra extra small BMP and Attrax.  The implant was tamped into position and positioning was confirmed with C arm.   Locking mechanisms were engaged, soft tissues were inspected and found to be in good repair. Retractors were removed.  Fascia was closed with 1 PDS running stitch, skin edges closed with 2-0 and 3-0 vicryl sutures.  Wound was dressed with a sterile occlusive dressing.    Patient was then  placed in a right lateral decubitus position on the operative table and using orthogonally projected C-arm fluoroscopy the patient was placed so that the L 45, L 34, L 23, L 12  levels were visualized in AP and lateral plane. The patient was then taped into position.  Skin was marked along with a posterior finger dissection incision. His flank was then prepped and draped in usual sterile fashion and incisions were made sequentially at L 45 level, then moving up. Posterior finger dissection was made to enter the retroperitoneal space and  then subsequently the probe was inserted into the psoas muscle from the left side initially at the L 45 level. Despite multiple efforts, we were unable to obtain an avenue to the disc space that was not in extremely close to the lumbar plexus, so we elected to not perform surgery at this level.  Subsequently, we operated at L 34.  After mapping the neural elements were able to dock the probe per the midpoint of this vertebral level and without indications electrically of too close  proximity to the neural tissues. Subsequently the self-retaining tractor was.after sequential dilators were utilized the shim was employed and the interspace was cleared of psoas muscle and then incised. A thorough discectomy was performed. Instruments were used to clear the interspace of disc material. After thorough discectomy was performed and this was performed using AP and lateral fluoroscopy a 15 degree lordotic by 12 x 55 x 22 mm implant ( Modulus Titanium cage) was packed with extra small BMP and Attrax. This was tamped into position using the slides and its position was confirmed on AP and lateral fluoroscopy. Subsequently, we operated at L 23.  After mapping the neural elements were able to dock the probe per the midpoint of this vertebral level and without indications electrically of too close proximity to the neural tissues. Subsequently the self-retaining tractor was.after sequential dilators were utilized the shim was employed and the interspace was cleared of psoas muscle and then incised. A thorough discectomy was performed. Instruments were used to clear the interspace of disc material. After thorough discectomy was performed and this was performed using AP and lateral fluoroscopy a 10 degree lordotic ( Modulus Titanium cage) by 10 x 50 x 22 mm implant was packed with extra small BMP and Attrax. This was tamped into position using the slides and its position was confirmed on AP and lateral fluoroscopy.Subsequently, we operated at L 12.  After mapping the neural elements were able to dock the probe per the midpoint of this vertebral level and without indications electrically of too close proximity to the neural tissues. Subsequently the self-retaining tractor was.after sequential dilators were utilized the shim was employed and the interspace was cleared of psoas muscle and then incised. A thorough discectomy was performed. Instruments were used to clear the interspace of disc material. After  thorough discectomy was performed and this was performed using AP and lateral fluoroscopy a 10 degree lordotic ( Modulus Titanium cage) by 10 x 55 x 22 mm implant was packed with extra small BMP and Attrax. This was tamped into position using the slides and its position was confirmed on AP and lateral fluoroscopy. Hemostasis was assured the wounds were irrigated interrupted Vicryl sutures.  Sterile occlusive dressing was placed with Dermabond and occlusive dressings. The patient was then extubated in the operating room and taken to recovery in stable and satisfactory condition having tolerated his operation well. Counts were correct at the end of the case.  Patient was extubated in the OR and taken to recovery having tolerated his surgery well.  Counts were correct.    PLAN OF CARE: Admit to inpatient   PATIENT DISPOSITION:  PACU - hemodynamically stable.   Delay start of Pharmacological VTE agent (>24hrs) due to surgical blood loss or risk of bleeding: yes  Retractor Times:  L 34: 33 minutes L 23:  19 minutes L 12:  17 minutes  Pelvic Parameters:    PT 26.5 PI 65.7 LL -40 PI-LL +25.7 SVA 132.2

## 2017-10-18 NOTE — Brief Op Note (Signed)
10/18/2017  1:14 PM  PATIENT:  Bobby [name]  62 y.o. male  PRE-OPERATIVE DIAGNOSIS:  Thoracolumbar Scoliosis due to degenerative disease of spine with spondylolisthesis, stenosis, HNP, radiculopathy, lumbago  POST-OPERATIVE DIAGNOSIS:  Thoracolumbar Scoliosis due to degenerative disease of spine with spondylolisthesis, stenosis, HNP, radiculopathy, lumbago  PROCEDURE:  Procedure(s) with comments: Lumbar Five-Sacral One Anterior lumbar interbody fusion with Dr. Sherren Mocha Early (N/A) - Part-1 Abdominal approach Left Lumbar One-Two,Lumbar Two-Three,Lumbar Three-Four Anterolateral lumbar interbody fusion (Left) - anterolateral approach Part-2 ABDOMINAL EXPOSURE (N/A) - Part-1  SURGEON:  Surgeon(s) and Role: Panel 1:    Erline Levine, MD - Primary    * Kary Kos, MD - Assisting Panel 2:    * Early, Arvilla Meres, MD - Primary  PHYSICIAN ASSISTANT:   ASSISTANTS: Poteat, RN   ANESTHESIA:   general  EBL:  225 mL   BLOOD ADMINISTERED:none  DRAINS: none   LOCAL MEDICATIONS USED:  MARCAINE    and LIDOCAINE   SPECIMEN:  No Specimen  DISPOSITION OF SPECIMEN:  N/A  COUNTS:  YES  TOURNIQUET:  * No tourniquets in log *  DICTATION: DICTATION:   INDICATIONS:  [name] is 62 year old male with chronic and intractable back and bilateral lower extremity pain with fixed coronal and sagittal spinal deformities, who has previously undergone posterior decompression with left laminoforaminotomies at L 34, L 45, L 5 S1 levels with persistent and intractable left greater than right lower extremity pain.  It was elected to take him to surgery for anterior lumbar decompression and fusion at the L 5 S1 level.   and to place a hyperlordotic ALIF cage at L 5 S 1 and then to perform left sided XLIF at L 12, L 23, L 34, L 45 levels.  PROCEDURE:  Doctor Early performed exposure and his portion of the procedure will be dictated separately.  Upon exposing the L 5 S1 level, a localizing X ray was obtained with the  C arm.  I then incised the anterior annulus and performed a thorough discectomy with wide ligamentous releases.  The endplates were cleared of disc and cartilagenous material and a thorough discectomy was performed with decompression of the ventral annulus and disc material.  After trials, a 20 degree, 10 x 42 x 20 Base Hyperlordotic ALIF cage was placed and lagged with 3, 5.0 x 22.5 and 20 mm screws, one 20 mm screw in L 5 and one of each in S 1. This was a  titanium spacer which was selected, packed with extra extra small BMP and Attrax.  The implant was tamped into position and positioning was confirmed with C arm.   Locking mechanisms were engaged, soft tissues were inspected and found to be in good repair. Retractors were removed.  Fascia was closed with 1 PDS running stitch, skin edges closed with 2-0 and 3-0 vicryl sutures.  Wound was dressed with a sterile occlusive dressing.    Patient was then  placed in a right lateral decubitus position on the operative table and using orthogonally projected C-arm fluoroscopy the patient was placed so that the L 45, L 34, L 23, L 12  levels were visualized in AP and lateral plane. The patient was then taped into position.  Skin was marked along with a posterior finger dissection incision. His flank was then prepped and draped in usual sterile fashion and incisions were made sequentially at L 45 level, then moving up. Posterior finger dissection was made to enter the retroperitoneal space and  then subsequently the probe was inserted into the psoas muscle from the left side initially at the L 45 level. Despite multiple efforts, we were unable to obtain an avenue to the disc space that was not in extremely close to the lumbar plexus, so we elected to not perform surgery at this level.  Subsequently, we operated at L 34.  After mapping the neural elements were able to dock the probe per the midpoint of this vertebral level and without indications electrically of too close  proximity to the neural tissues. Subsequently the self-retaining tractor was.after sequential dilators were utilized the shim was employed and the interspace was cleared of psoas muscle and then incised. A thorough discectomy was performed. Instruments were used to clear the interspace of disc material. After thorough discectomy was performed and this was performed using AP and lateral fluoroscopy a 15 degree lordotic by 12 x 55 x 22 mm implant ( Modulus Titanium cage) was packed with extra small BMP and Attrax. This was tamped into position using the slides and its position was confirmed on AP and lateral fluoroscopy. Subsequently, we operated at L 23.  After mapping the neural elements were able to dock the probe per the midpoint of this vertebral level and without indications electrically of too close proximity to the neural tissues. Subsequently the self-retaining tractor was.after sequential dilators were utilized the shim was employed and the interspace was cleared of psoas muscle and then incised. A thorough discectomy was performed. Instruments were used to clear the interspace of disc material. After thorough discectomy was performed and this was performed using AP and lateral fluoroscopy a 10 degree lordotic ( Modulus Titanium cage) by 10 x 50 x 22 mm implant was packed with extra small BMP and Attrax. This was tamped into position using the slides and its position was confirmed on AP and lateral fluoroscopy.Subsequently, we operated at L 12.  After mapping the neural elements were able to dock the probe per the midpoint of this vertebral level and without indications electrically of too close proximity to the neural tissues. Subsequently the self-retaining tractor was.after sequential dilators were utilized the shim was employed and the interspace was cleared of psoas muscle and then incised. A thorough discectomy was performed. Instruments were used to clear the interspace of disc material. After  thorough discectomy was performed and this was performed using AP and lateral fluoroscopy a 10 degree lordotic ( Modulus Titanium cage) by 10 x 55 x 22 mm implant was packed with extra small BMP and Attrax. This was tamped into position using the slides and its position was confirmed on AP and lateral fluoroscopy. Hemostasis was assured the wounds were irrigated interrupted Vicryl sutures.  Sterile occlusive dressing was placed with Dermabond and occlusive dressings. The patient was then extubated in the operating room and taken to recovery in stable and satisfactory condition having tolerated his operation well. Counts were correct at the end of the case.  Patient was extubated in the OR and taken to recovery having tolerated his surgery well.  Counts were correct.    PLAN OF CARE: Admit to inpatient   PATIENT DISPOSITION:  PACU - hemodynamically stable.   Delay start of Pharmacological VTE agent (>24hrs) due to surgical blood loss or risk of bleeding: yes  Retractor Times:  L 34: 33 minutes L 23:  19 minutes L 12:  17 minutes  Pelvic Parameters:    PT 26.5 PI 65.7 LL -40 PI-LL +25.7 SVA 132.2

## 2017-10-18 NOTE — H&P (Signed)
Patient ID:   [id number] Patient: Bobby [name]  Date of Birth: 10/12/1955 Visit Type: Office Visit   Date: 06/14/2017 12:30 PM Provider: Marchia Meiers. Vertell Limber MD   This 62 year old male presents for back pain.   History of Present Illness: 1.  back pain  Patient presents with left leg numbness radiating to the foot. He grades this pain as 8/10 with a range of 5-10/10. Numbness in the left arm radiating to the 4th and fifth digit. He notes numbness worsens when he lays on his left side.  MRI 04/01/17: Postoperative changes on the left from L3-4 to L5-S1 since the prior MRI. Improved thecal sac patency at those levels. Mild residual left lateral recess stenosis, perhaps moderate at L3-4. Stable moderate left neural foraminal stenosis from the L3-4 nerve levels. Increased degenerative appearing marrow edema in the left L4 and L5 pedicles is accompanied by increased residual left L4-5 facet degeneration, and may reflect increased biomechanical stress following the left side laminectomies. No new or increased lumbar stenosis. Underlying moderate levoconvex scoliosis with chronic thoracic and lumbar disc, endplate, and facet degeneration.         Medical/Surgical/Interim History Reviewed, no change.  Last detailed document date:03/12/2016.     Family History: Reviewed, no changes.  Last detailed document date:03/12/2016.   Social History: Reviewed, no changes. Last detailed document date: 03/12/2016.    MEDICATIONS(added, continued or stopped this visit): Started Medication Directions Instruction Stopped   Aspir-81  BUCCAL      atorvastatin 20 mg tablet take 1 tablet by oral route  every day     tramadol 50 mg tablet take 1 tablet by oral route  every 6 hours as needed  06/14/2017  06/14/2017 tramadol 50 mg tablet take 1 tablet by oral route  every 6 hours as needed     Vitamin B-12 50 mcg tablet      Vitamin D3 2,000 unit capsule        ALLERGIES: Ingredient Reaction Medication  Name Comment  NO KNOWN ALLERGIES     No known allergies. Reviewed, no changes.    Vitals Date Temp F BP Pulse Ht In Wt Lb BMI BSA Pain Score  06/14/2017  164/116 58 64 215 36.9  4/10      IMPRESSION Patient presents with left leg and left arm numbness and tingling. MRI of lumbar spine shows levoconvex scoliosis with chronic thoracic and lumbar disc, endplate, and facet degeneration. MRI of cervical spine shows bone spur at left C3-4. On confrontational testing, mildly positive Spurling to the left, positive Tinel's left elbow, full strength in upper extremities, 4/5 left EHL, reflexes are symmetric. Schedule stage I L5-S1 ALIF and L1-2, L2-3, L3-4, and L4-5 XLIF; stage II T9-pelvis screws.  Completed Orders (this encounter) Order Details Reason Side Interpretation Result Initial Treatment Date Region  Hypertension education Continue to monitor blood pressure. If blood pressure remains elevated contact primary care provider        Dietary management education, guidance, and counseling patient encouraged to eat a well balanced diet         Assessment/Plan # Detail Type Description   1. Assessment Scoliosis due to degenerative disease of spine in adult patient (M41.50).   Plan Orders TLSO Brace.       2. Assessment Lumbar radiculopathy (M54.16).       3. Assessment Elevated blood-pressure reading, w/o diagnosis of htn (R03.0).       4. Assessment Body mass index (BMI) 36.0-36.9, adult (Z68.36).  Plan Orders Today's instructions / counseling include(s) Dietary management education, guidance, and counseling.           Pain Management Plan Pain Scale: 4/10. Method: Numeric Pain Intensity Scale. Location: back. Onset: 01/16/2017. Duration: varies. Quality: discomforting. Pain management follow-up plan of care: Patient taking medication as prescribed..  Fall Risk Plan The patient has not fallen in the last year.  Schedule stage I L5-S1 ALIF and L1-2, L2-3, L3-4, and L4-5  XLIF; stage II T9-pelvis screws. Nurse education given. Fit for TLSO brace.  Orders: Diagnostic Procedures: Assessment Procedure  M54.16 Scoliosis- AP/Lat  Instruction(s)/Education: Assessment Instruction  R03.0 Hypertension education  Z68.36 Dietary management education, guidance, and counseling  Miscellaneous: Assessment   M41.50 TLSO Brace    MEDICATIONS PRESCRIBED TODAY    Rx Quantity Refills  TRAMADOL HCL 50 mg  60 0            Provider:  Vertell Limber MD, Marchia Meiers 06/14/2017 2:27 PM  Dictation edited by: Lucita Lora    CC Providers: [name] [address],  [place]  [id number]-   [name]  [address] [place], [place] [address]-              Electronically signed by Marchia Meiers. Vertell Limber MD on 06/15/2017 05:37 PM

## 2017-10-18 NOTE — Transfer of Care (Signed)
Immediate Anesthesia Transfer of Care Note  Patient: Bobby [name]  Procedure(s) Performed: Lumbar Five-Sacral One Anterior lumbar interbody fusion with Dr. Sherren Mocha Early (N/A Spine Lumbar) Left Lumbar One-Two,Lumbar Two-Three,Lumbar Three-Four Anterolateral lumbar interbody fusion (Left Spine Lumbar) ABDOMINAL EXPOSURE (N/A Abdomen)  Patient Location: PACU  Anesthesia Type:General  Level of Consciousness: awake, alert  and oriented  Airway & Oxygen Therapy: Patient Spontanous Breathing and Patient connected to face mask oxygen  Post-op Assessment: Report given to RN, Post -op Vital signs reviewed and stable, Patient moving all extremities and Patient moving all extremities X 4  Post vital signs: Reviewed and stable  Last Vitals:  Vitals:   10/18/17 0601 10/18/17 1350  BP: 136/86 129/89  Pulse: 60 (!) 126  Resp: 18 (!) 26  Temp: 36.7 C (!) 36.3 C  SpO2: 98% 96%    Last Pain:  Vitals:   10/18/17 0601  TempSrc: Oral         Complications: No apparent anesthesia complications

## 2017-10-18 NOTE — Anesthesia Procedure Notes (Signed)
Arterial Line Insertion Start/End1/14/2019 6:55 AM, 10/18/2017 7:52 AM Performed by: Barnet Glasgow, MD  Patient location: Pre-op. Preanesthetic checklist: patient identified, risks and benefits discussed, surgical consent, monitors and equipment checked and pre-op evaluation Lidocaine 1% used for infiltration Left, radial was placed Catheter size: 20 G Hand hygiene performed  and maximum sterile barriers used   Attempts: 2 Procedure performed without using ultrasound guided technique. Following insertion, dressing applied. Post procedure assessment: normal  Patient tolerated the procedure well with no immediate complications.

## 2017-10-18 NOTE — Progress Notes (Signed)
Notified [name] in [place] regarding patient need for DG Scoliosis eval complete spin 2-3 views to be done tonight so they could be read before his sx at 07:00 in the early morning. He said that was not problem. Will continue to monitor.

## 2017-10-19 ENCOUNTER — Inpatient Hospital Stay (HOSPITAL_COMMUNITY): Admission: RE | Admit: 2017-10-19 | Payer: 59 | Source: Ambulatory Visit | Admitting: Neurosurgery

## 2017-10-19 ENCOUNTER — Encounter (HOSPITAL_COMMUNITY): Payer: Self-pay

## 2017-10-19 ENCOUNTER — Inpatient Hospital Stay (HOSPITAL_COMMUNITY): Payer: 59

## 2017-10-19 ENCOUNTER — Encounter (HOSPITAL_COMMUNITY)
Admission: RE | Disposition: A | Discharge status: Rehabilitation Facility | Payer: Self-pay | Source: Ambulatory Visit | Attending: Neurosurgery

## 2017-10-19 ENCOUNTER — Inpatient Hospital Stay (HOSPITAL_COMMUNITY): Payer: 59 | Admitting: Anesthesiology

## 2017-10-19 ENCOUNTER — Other Ambulatory Visit: Payer: Self-pay

## 2017-10-19 HISTORY — PX: POSTERIOR LUMBAR FUSION 4 LEVEL: SHX6037

## 2017-10-19 HISTORY — PX: APPLICATION OF INTRAOPERATIVE CT SCAN: SHX6668

## 2017-10-19 LAB — POCT I-STAT 4, (NA,K, GLUC, HGB,HCT)
Glucose, Bld: 118 mg/dL — ABNORMAL HIGH (ref 65–99)
HCT: 26 % — ABNORMAL LOW (ref 39.0–52.0)
HEMOGLOBIN: 8.8 g/dL — AB (ref 13.0–17.0)
POTASSIUM: 4.1 mmol/L (ref 3.5–5.1)
Sodium: 137 mmol/L (ref 135–145)

## 2017-10-19 LAB — PREPARE RBC (CROSSMATCH)

## 2017-10-19 SURGERY — POSTERIOR LUMBAR FUSION 4 LEVEL
Anesthesia: General | Site: Back

## 2017-10-19 MED ORDER — PHENOL 1.4 % MT LIQD: 1.0000 | OROMUCOSAL | Status: DC | PRN

## 2017-10-19 MED ORDER — VANCOMYCIN HCL 1000 MG IV SOLR
INTRAVENOUS | Status: DC | PRN
  Administered 2017-10-19: 1000 mg via TOPICAL

## 2017-10-19 MED ORDER — THROMBIN (RECOMBINANT) 20000 UNITS EX SOLR
CUTANEOUS | Status: DC | PRN
  Administered 2017-10-19: 09:00:00 via TOPICAL

## 2017-10-19 MED ORDER — MORPHINE SULFATE (PF) 4 MG/ML IV SOLN
4.0000 mg | INTRAVENOUS | Status: DC | PRN
  Administered 2017-10-19 – 2017-10-20 (×3): 4 mg via INTRAVENOUS
  Filled 2017-10-19 (×3): qty 1

## 2017-10-19 MED ORDER — GLYCOPYRROLATE 0.2 MG/ML IJ SOLN
INTRAMUSCULAR | Status: DC | PRN
  Administered 2017-10-19: 0.2 mg via INTRAVENOUS

## 2017-10-19 MED ORDER — PHENYLEPHRINE HCL 10 MG/ML IJ SOLN
INTRAMUSCULAR | Status: DC | PRN
  Administered 2017-10-19 (×3): 80 ug via INTRAVENOUS
  Administered 2017-10-19: 40 ug via INTRAVENOUS
  Administered 2017-10-19: 120 ug via INTRAVENOUS
  Administered 2017-10-19 (×3): 80 ug via INTRAVENOUS

## 2017-10-19 MED ORDER — OXYCODONE HCL 5 MG PO TABS
10.0000 mg | ORAL_TABLET | ORAL | Status: DC | PRN
Start: 2017-10-19 — End: 2017-10-23
  Administered 2017-10-19 – 2017-10-23 (×15): 10 mg via ORAL
  Filled 2017-10-19 (×15): qty 2

## 2017-10-19 MED ORDER — HYDROMORPHONE HCL 1 MG/ML IJ SOLN
INTRAMUSCULAR | Status: AC
  Administered 2017-10-19: 17:00:00
  Filled 2017-10-19: qty 1

## 2017-10-19 MED ORDER — BUPIVACAINE LIPOSOME 1.3 % IJ SUSP
20.0000 mL | INTRAMUSCULAR | Status: AC
  Administered 2017-10-19: 20 mL
  Filled 2017-10-19: qty 20

## 2017-10-19 MED ORDER — PANTOPRAZOLE SODIUM 40 MG IV SOLR
40.0000 mg | Freq: Every day | INTRAVENOUS | Status: DC
  Administered 2017-10-19 – 2017-10-21 (×3): 40 mg via INTRAVENOUS
  Filled 2017-10-19 (×3): qty 40

## 2017-10-19 MED ORDER — TRANEXAMIC ACID 1000 MG/10ML IV SOLN
1.5000 mg/kg/h | INTRAVENOUS | Status: AC
  Administered 2017-10-19: 2 mg/kg/h via INTRAVENOUS
  Filled 2017-10-19: qty 10

## 2017-10-19 MED ORDER — BISACODYL 10 MG RE SUPP
10.0000 mg | Freq: Every day | RECTAL | Status: DC | PRN
  Administered 2017-10-20: 10 mg via RECTAL
  Filled 2017-10-19: qty 1

## 2017-10-19 MED ORDER — TRANEXAMIC ACID 1000 MG/10ML IV SOLN
INTRAVENOUS | Status: DC | PRN
  Administered 2017-10-19: 1000 mg via INTRAVENOUS

## 2017-10-19 MED ORDER — ARTIFICIAL TEARS OPHTHALMIC OINT
TOPICAL_OINTMENT | OPHTHALMIC | Status: DC | PRN
  Administered 2017-10-19: 1 via OPHTHALMIC

## 2017-10-19 MED ORDER — HYDROMORPHONE HCL 1 MG/ML IJ SOLN
0.2500 mg | INTRAMUSCULAR | Status: DC | PRN
  Administered 2017-10-19: 0.5 mg via INTRAVENOUS

## 2017-10-19 MED ORDER — MIDAZOLAM HCL 5 MG/5ML IJ SOLN
INTRAMUSCULAR | Status: DC | PRN
  Administered 2017-10-19: 2 mg via INTRAVENOUS

## 2017-10-19 MED ORDER — LACTATED RINGERS IV SOLN
INTRAVENOUS | Status: DC | PRN
  Administered 2017-10-19: 08:00:00 via INTRAVENOUS

## 2017-10-19 MED ORDER — ZOLPIDEM TARTRATE 5 MG PO TABS
5.0000 mg | ORAL_TABLET | Freq: Every evening | ORAL | Status: DC | PRN
  Administered 2017-10-19 – 2017-10-20 (×2): 5 mg via ORAL
  Filled 2017-10-19 (×2): qty 1

## 2017-10-19 MED ORDER — DIAZEPAM 5 MG PO TABS: 5.0000 mg | ORAL_TABLET | Freq: Four times a day (QID) | ORAL | Status: DC | PRN

## 2017-10-19 MED ORDER — EPHEDRINE SULFATE 50 MG/ML IJ SOLN
INTRAMUSCULAR | Status: DC | PRN
  Administered 2017-10-19 (×3): 10 mg via INTRAVENOUS

## 2017-10-19 MED ORDER — LACTATED RINGERS IV SOLN
INTRAVENOUS | Status: DC | PRN
  Administered 2017-10-19 (×3): via INTRAVENOUS

## 2017-10-19 MED ORDER — SODIUM CHLORIDE 0.9 % IV SOLN: 250.0000 mL | INTRAVENOUS | Status: DC

## 2017-10-19 MED ORDER — FLEET ENEMA 7-19 GM/118ML RE ENEM: 1.0000 | ENEMA | Freq: Once | RECTAL | Status: DC | PRN

## 2017-10-19 MED ORDER — CEFAZOLIN SODIUM-DEXTROSE 2-3 GM-%(50ML) IV SOLR
INTRAVENOUS | Status: DC | PRN
  Administered 2017-10-19 (×2): 2 g via INTRAVENOUS

## 2017-10-19 MED ORDER — ONDANSETRON HCL 4 MG PO TABS: 4.0000 mg | ORAL_TABLET | Freq: Four times a day (QID) | ORAL | Status: DC | PRN

## 2017-10-19 MED ORDER — SODIUM CHLORIDE 0.9 % IJ SOLN
INTRAMUSCULAR | Status: DC | PRN
  Administered 2017-10-19 (×2): 10 mL via INTRAVENOUS

## 2017-10-19 MED ORDER — SODIUM CHLORIDE 0.9 % IV SOLN: Freq: Once | INTRAVENOUS | Status: DC

## 2017-10-19 MED ORDER — PROPOFOL 10 MG/ML IV BOLUS
INTRAVENOUS | Status: DC | PRN
  Administered 2017-10-19: 50 mg via INTRAVENOUS
  Administered 2017-10-19: 100 mg via INTRAVENOUS

## 2017-10-19 MED ORDER — BUPIVACAINE HCL (PF) 0.5 % IJ SOLN
INTRAMUSCULAR | Status: DC | PRN
  Administered 2017-10-19: 10 mL

## 2017-10-19 MED ORDER — SUCCINYLCHOLINE CHLORIDE 20 MG/ML IJ SOLN
INTRAMUSCULAR | Status: DC | PRN
  Administered 2017-10-19: 150 mg via INTRAVENOUS

## 2017-10-19 MED ORDER — DEXAMETHASONE SODIUM PHOSPHATE 10 MG/ML IJ SOLN
INTRAMUSCULAR | Status: DC | PRN
  Administered 2017-10-19: 10 mg via INTRAVENOUS

## 2017-10-19 MED ORDER — SUGAMMADEX SODIUM 200 MG/2ML IV SOLN
INTRAVENOUS | Status: DC | PRN
  Administered 2017-10-19: 200 mg via INTRAVENOUS

## 2017-10-19 MED ORDER — ONDANSETRON HCL 4 MG/2ML IJ SOLN: 4.0000 mg | Freq: Four times a day (QID) | INTRAMUSCULAR | Status: DC | PRN

## 2017-10-19 MED ORDER — POLYETHYLENE GLYCOL 3350 17 G PO PACK: 17.0000 g | PACK | Freq: Every day | ORAL | Status: DC | PRN

## 2017-10-19 MED ORDER — ONDANSETRON HCL 4 MG/2ML IJ SOLN
INTRAMUSCULAR | Status: DC | PRN
  Administered 2017-10-19: 4 mg via INTRAVENOUS

## 2017-10-19 MED ORDER — ALBUMIN HUMAN 5 % IV SOLN
INTRAVENOUS | Status: DC | PRN
  Administered 2017-10-19 (×3): via INTRAVENOUS

## 2017-10-19 MED ORDER — PHENYLEPHRINE HCL 10 MG/ML IJ SOLN
INTRAVENOUS | Status: DC | PRN
  Administered 2017-10-19: 15 ug/min via INTRAVENOUS

## 2017-10-19 MED ORDER — SODIUM CHLORIDE 0.9% FLUSH: 3.0000 mL | INTRAVENOUS | Status: DC | PRN

## 2017-10-19 MED ORDER — THROMBIN (RECOMBINANT) 5000 UNITS EX SOLR
OROMUCOSAL | Status: DC | PRN
  Administered 2017-10-19: 09:00:00 via TOPICAL

## 2017-10-19 MED ORDER — LIDOCAINE-EPINEPHRINE 1 %-1:100000 IJ SOLN
INTRAMUSCULAR | Status: DC | PRN
  Administered 2017-10-19: 10 mL via INTRADERMAL

## 2017-10-19 MED ORDER — HYDROCODONE-ACETAMINOPHEN 10-325 MG PO TABS: 1.0000 | ORAL_TABLET | ORAL | Status: DC | PRN

## 2017-10-19 MED ORDER — 0.9 % SODIUM CHLORIDE (POUR BTL) OPTIME
TOPICAL | Status: DC | PRN
  Administered 2017-10-19: 1000 mL

## 2017-10-19 MED ORDER — SODIUM CHLORIDE 0.9% FLUSH
3.0000 mL | Freq: Two times a day (BID) | INTRAVENOUS | Status: DC
  Administered 2017-10-19 – 2017-10-23 (×6): 3 mL via INTRAVENOUS

## 2017-10-19 MED ORDER — LIDOCAINE HCL (CARDIAC) 20 MG/ML IV SOLN
INTRAVENOUS | Status: DC | PRN
  Administered 2017-10-19: 30 mg via INTRAVENOUS
  Administered 2017-10-19: 70 mg via INTRAVENOUS

## 2017-10-19 MED ORDER — CEFAZOLIN SODIUM-DEXTROSE 2-4 GM/100ML-% IV SOLN
2.0000 g | Freq: Three times a day (TID) | INTRAVENOUS | Status: AC
  Administered 2017-10-19 – 2017-10-20 (×2): 2 g via INTRAVENOUS
  Filled 2017-10-19 (×2): qty 100

## 2017-10-19 MED ORDER — ROCURONIUM BROMIDE 100 MG/10ML IV SOLN
INTRAVENOUS | Status: DC | PRN
  Administered 2017-10-19 (×2): 30 mg via INTRAVENOUS
  Administered 2017-10-19: 20 mg via INTRAVENOUS

## 2017-10-19 MED ORDER — DOCUSATE SODIUM 100 MG PO CAPS
100.0000 mg | ORAL_CAPSULE | Freq: Two times a day (BID) | ORAL | Status: DC
  Administered 2017-10-19 – 2017-10-23 (×8): 100 mg via ORAL
  Filled 2017-10-19 (×8): qty 1

## 2017-10-19 MED ORDER — ACETAMINOPHEN 650 MG RE SUPP: 650.0000 mg | RECTAL | Status: DC | PRN

## 2017-10-19 MED ORDER — SODIUM CHLORIDE 0.9 % IV SOLN
INTRAVENOUS | Status: DC | PRN
  Administered 2017-10-19: 11:00:00 via INTRAVENOUS

## 2017-10-19 MED ORDER — KCL IN DEXTROSE-NACL 20-5-0.45 MEQ/L-%-% IV SOLN
INTRAVENOUS | Status: DC
  Administered 2017-10-19 – 2017-10-21 (×4): via INTRAVENOUS
  Filled 2017-10-19 (×7): qty 1000

## 2017-10-19 MED ORDER — MENTHOL 3 MG MT LOZG: 1.0000 | LOZENGE | OROMUCOSAL | Status: DC | PRN

## 2017-10-19 MED ORDER — ACETAMINOPHEN 325 MG PO TABS: 650.0000 mg | ORAL_TABLET | ORAL | Status: DC | PRN

## 2017-10-19 MED ORDER — ALUM & MAG HYDROXIDE-SIMETH 200-200-20 MG/5ML PO SUSP: 30.0000 mL | Freq: Four times a day (QID) | ORAL | Status: DC | PRN

## 2017-10-19 MED ORDER — FENTANYL CITRATE (PF) 100 MCG/2ML IJ SOLN
INTRAMUSCULAR | Status: DC | PRN
  Administered 2017-10-19: 50 ug via INTRAVENOUS
  Administered 2017-10-19: 250 ug via INTRAVENOUS
  Administered 2017-10-19: 50 ug via INTRAVENOUS

## 2017-10-19 SURGICAL SUPPLY — 86 items
BASKET BONE COLLECTION (BASKET) ×6 IMPLANT
BLADE CLIPPER SURG (BLADE) ×3 IMPLANT
BONE CANC CHIPS 40CC CAN1/2 (Bone Implant) ×9 IMPLANT
BUR MATCHSTICK NEURO 3.0 LAGG (BURR) ×3 IMPLANT
BUR PRECISION FLUTE 5.0 (BURR) ×3 IMPLANT
CANISTER SUCT 3000ML PPV (MISCELLANEOUS) ×3 IMPLANT
CARTRIDGE OIL MAESTRO DRILL (MISCELLANEOUS) ×4 IMPLANT
CHIPS CANC BONE 40CC CAN1/2 (Bone Implant) ×6 IMPLANT
CONNECTOR RELINE 30MM OPEN OFF (Connector) ×6 IMPLANT
CONT SPEC 4OZ CLIKSEAL STRL BL (MISCELLANEOUS) ×3 IMPLANT
COVER BACK TABLE 60X90IN (DRAPES) ×3 IMPLANT
DECANTER SPIKE VIAL GLASS SM (MISCELLANEOUS) ×3 IMPLANT
DERMABOND ADVANCED (GAUZE/BANDAGES/DRESSINGS) ×1
DERMABOND ADVANCED .7 DNX12 (GAUZE/BANDAGES/DRESSINGS) ×2 IMPLANT
DIFFUSER DRILL AIR PNEUMATIC (MISCELLANEOUS) ×6 IMPLANT
DIGITIZER BENDINI (MISCELLANEOUS) ×3 IMPLANT
DRAPE C-ARM 42X72 X-RAY (DRAPES) IMPLANT
DRAPE C-ARMOR (DRAPES) IMPLANT
DRAPE LAPAROTOMY 100X72X124 (DRAPES) ×3 IMPLANT
DRAPE POUCH INSTRU U-SHP 10X18 (DRAPES) ×3 IMPLANT
DRAPE PROXIMA HALF (DRAPES) ×9 IMPLANT
DRAPE SCAN PATIENT (DRAPES) ×6 IMPLANT
DRAPE SURG 17X23 STRL (DRAPES) ×3 IMPLANT
DRSG OPSITE POSTOP 3X4 (GAUZE/BANDAGES/DRESSINGS) ×3 IMPLANT
DRSG OPSITE POSTOP 4X10 (GAUZE/BANDAGES/DRESSINGS) ×6 IMPLANT
DURAPREP 26ML APPLICATOR (WOUND CARE) ×3 IMPLANT
ELECT REM PT RETURN 9FT ADLT (ELECTROSURGICAL) ×3
ELECTRODE REM PT RTRN 9FT ADLT (ELECTROSURGICAL) ×2 IMPLANT
EVACUATOR 1/8 PVC DRAIN (DRAIN) ×3 IMPLANT
GAUZE SPONGE 4X4 12PLY STRL (GAUZE/BANDAGES/DRESSINGS) ×3 IMPLANT
GAUZE SPONGE 4X4 16PLY XRAY LF (GAUZE/BANDAGES/DRESSINGS) IMPLANT
GLOVE BIO SURGEON STRL SZ8 (GLOVE) ×9 IMPLANT
GLOVE BIOGEL PI IND STRL 8 (GLOVE) ×6 IMPLANT
GLOVE BIOGEL PI IND STRL 8.5 (GLOVE) ×6 IMPLANT
GLOVE BIOGEL PI INDICATOR 8 (GLOVE) ×3
GLOVE BIOGEL PI INDICATOR 8.5 (GLOVE) ×3
GLOVE ECLIPSE 8.0 STRL XLNG CF (GLOVE) ×9 IMPLANT
GLOVE ECLIPSE 9.0 STRL (GLOVE) ×3 IMPLANT
GLOVE EXAM NITRILE LRG STRL (GLOVE) IMPLANT
GLOVE EXAM NITRILE XL STR (GLOVE) IMPLANT
GLOVE EXAM NITRILE XS STR PU (GLOVE) IMPLANT
GOWN STRL REUS W/ TWL LRG LVL3 (GOWN DISPOSABLE) IMPLANT
GOWN STRL REUS W/ TWL XL LVL3 (GOWN DISPOSABLE) ×4 IMPLANT
GOWN STRL REUS W/TWL 2XL LVL3 (GOWN DISPOSABLE) ×6 IMPLANT
GOWN STRL REUS W/TWL LRG LVL3 (GOWN DISPOSABLE)
GOWN STRL REUS W/TWL XL LVL3 (GOWN DISPOSABLE) ×2
IMPL COROENT OBLQUE 8X10X25 (Orthopedic Implant) ×2 IMPLANT
IMPLANT COROENT OBLQUE 8X10X25 (Orthopedic Implant) ×3 IMPLANT
KIT BASIN OR (CUSTOM PROCEDURE TRAY) ×3 IMPLANT
KIT POSITION SURG JACKSON T1 (MISCELLANEOUS) ×3 IMPLANT
KIT ROOM TURNOVER OR (KITS) ×3 IMPLANT
MARKER SPHERE PSV REFLC 13MM (MARKER) ×18 IMPLANT
MILL MEDIUM DISP (BLADE) ×3 IMPLANT
NEEDLE HYPO 21X1.5 ECLIPSE (NEEDLE) ×3 IMPLANT
NEEDLE HYPO 25X1 1.5 SAFETY (NEEDLE) ×3 IMPLANT
NEEDLE SPNL 18GX3.5 QUINCKE PK (NEEDLE) IMPLANT
NS IRRIG 1000ML POUR BTL (IV SOLUTION) ×3 IMPLANT
OIL CARTRIDGE MAESTRO DRILL (MISCELLANEOUS) ×6
PACK LAMINECTOMY NEURO (CUSTOM PROCEDURE TRAY) ×3 IMPLANT
PAD ARMBOARD 7.5X6 YLW CONV (MISCELLANEOUS) ×9 IMPLANT
PATTIES SURGICAL .5 X.5 (GAUZE/BANDAGES/DRESSINGS) IMPLANT
PATTIES SURGICAL .5 X1 (DISPOSABLE) IMPLANT
PATTIES SURGICAL 1X1 (DISPOSABLE) IMPLANT
ROD RELINE 5.5X500MM STRAIGHT (Rod) ×6 IMPLANT
SCREW LOCK RELINE 5.5 TULIP (Screw) ×72 IMPLANT
SCREW LOCK RELINE CLOSED TULIP (Screw) ×6 IMPLANT
SCREW RELINE 6.5X35 POLYAXIAL (Screw) ×18 IMPLANT
SCREW RELINE ILIAC 8.5X70 2S (Screw) ×6 IMPLANT
SCREW RELINE-O POLY 5.5X45MM (Screw) ×12 IMPLANT
SCREW RELINE-O POLY 6.5X40 (Screw) ×9 IMPLANT
SCREW RELINE-O POLY 6.5X45 (Screw) ×15 IMPLANT
SCREW RELINE-O POLY 7.5X40 (Screw) ×12 IMPLANT
SPONGE LAP 18X18 X RAY DECT (DISPOSABLE) ×6 IMPLANT
SPONGE LAP 4X18 X RAY DECT (DISPOSABLE) ×3 IMPLANT
SPONGE SURGIFOAM ABS GEL 100 (HEMOSTASIS) ×3 IMPLANT
STAPLER SKIN PROX WIDE 3.9 (STAPLE) ×3 IMPLANT
SUT VIC AB 1 CT1 18XBRD ANBCTR (SUTURE) ×10 IMPLANT
SUT VIC AB 1 CT1 8-18 (SUTURE) ×5
SUT VIC AB 2-0 CT1 18 (SUTURE) ×12 IMPLANT
SUT VIC AB 3-0 SH 8-18 (SUTURE) ×12 IMPLANT
SYR 5ML LL (SYRINGE) IMPLANT
SYRINGE 20CC LL (MISCELLANEOUS) ×3 IMPLANT
TOWEL GREEN STERILE (TOWEL DISPOSABLE) ×3 IMPLANT
TOWEL GREEN STERILE FF (TOWEL DISPOSABLE) ×3 IMPLANT
TRAY FOLEY W/METER SILVER 16FR (SET/KITS/TRAYS/PACK) ×3 IMPLANT
WATER STERILE IRR 1000ML POUR (IV SOLUTION) ×3 IMPLANT

## 2017-10-19 NOTE — Op Note (Signed)
10/19/2017  2:28 PM  PATIENT:  Bobby [name]  62 y.o. male  PRE-OPERATIVE DIAGNOSIS:  Thoacolumbar Scoliosis due to degenerative disease of spine with stenosis, spondylolisthesis, herniated disc, radiculopathy, lumbago  POST-OPERATIVE DIAGNOSIS:  Thoacolumbar Scoliosis due to degenerative disease of spine with stenosis, spondylolisthesis, herniated disc, radiculopathy, lumbago  PROCEDURE:  Procedure(s) with comments: Thoracic Eight to Pelvis fixation and pedicle screws  Transforaminal Lumbar Interbody Fusion Lumbar Four-Five with possible osteotomy (N/A) - Thoracic Eight to Pelvis fixation and pedicle screws  Transforaminal Lumbar Interbody Fusion Lumbar Four-Five with Charline Bills osteotomy, posterolateral arthrodesis T 8 - S 1 APPLICATION OF INTRAOPERATIVE CT SCAN (N/A) - APPLICATION OF INTRAOPERATIVE CT SCAN  SURGEON:  Surgeon(s) and Role:    Erline Levine, MD - Primary    * Earnie Larsson, MD - Assisting  PHYSICIAN ASSISTANT:   ASSISTANTS: Poteat, RN   ANESTHESIA:   general  EBL:  800 mL   BLOOD ADMINISTERED:125 CC CELLSAVER and 2 units PRBCs  DRAINS: (Medium) Hemovact drain(s) in the paravertebral space with  Suction Open   LOCAL MEDICATIONS USED:  MARCAINE    and LIDOCAINE   SPECIMEN:  No Specimen  DISPOSITION OF SPECIMEN:  N/A  COUNTS:  YES  TOURNIQUET:  * No tourniquets in log *  DICTATION: Patient is 62 year old man who has undergone Stage I ALIF and XLIF yesterday who now presents for Stage II decompression and fusion T 8 to Ilium with TLIF at L 4 5 and osteotomies.  It was elected to take him to surgery for fusion from T 8 through ilium with Airo intraoperative CT navigation.  H has severe scoliosis with significant persistent sagittal imbalance despite anterior and lateral correction done yesterday.   Procedure: Patient was placed in a prone position on the [place] table attachment for the Airo after smooth and uncomplicated induction of general endotracheal  anesthesia. His lback was shaved and prepped and draped in usual sterile fashion with betadine scrub and DuraPrep. Area of incision was infiltrated with local lidocaine. Incision was made to the lumbodorsal fascia was incised and exposure was performed of the T  6 to the ilium bilaterally. After localizing X ray was performed, posterior elements and facet joints at L 45 were removed along with facet joints, laminae and spinous processes and bone was saved for grafting.  A TLIF was performed at L 45 on the right with an 8x 9 x 25 mm x 5 degree  Coroent Large oblique cage and 10 cc autograft.  The posterolateral region was extensively decorticated and pedicle screws were placed at T 8  through ilium bilaterally using intraoperative navigation with CT scanner.  At T 8- T 10 6.5 x 35 mm screws were placed,T 11 right 6.5 x 40, T 11 left 6.5 x 45, 12: 6.5 x 40bilaterally; L 1 bilaterally 5.5 x 45; L 2 5.5 x 45, L 2 - L 4 6.5 x 45,; L 5 and S 1: 7.5 x 40; Ilium 8.5 x 70 left and 8.5 x 70 right.  All screws were confirmed with CT scan.   Offset connectors were affixed to the Iliac screw heads.    A in-situ derotation was performed with rod benders on the right and Bendini rod bending system was utilized and rods were bent, affixed to screw heads and locked down with additional correction of coronal and sagittal curves.   Autograft was harvested from both ilia for use in fusion and the spinous processes and laminae and facetectomies along with marrow  rich blood along with 120 cc of allograft chips in the decorticated posterolateral region. The wound was irrigated. A medium Hemovac drain was placed. Long-acting Marcaine was injected into the paraspinous musculature.  Fascia was closed with 1 Vicryl sutures skin edges were reapproximated 2 and 3-0 Vicryl sutures. The wound was dressed with Dermabond and an occlusive dressing.  the patient was extubated in the operating room and taken to recovery in stable satisfactory  condition she tolerated the operation well counts were correct at the end of the case.  PLAN OF CARE: Admit to inpatient   PATIENT DISPOSITION:  PACU - hemodynamically stable.   Delay start of Pharmacological VTE agent (>24hrs) due to surgical blood loss or risk of bleeding: yes

## 2017-10-19 NOTE — Brief Op Note (Signed)
10/19/2017  2:28 PM  PATIENT:  Bobby [name]  62 y.o. male  PRE-OPERATIVE DIAGNOSIS:  Thoacolumbar Scoliosis due to degenerative disease of spine with stenosis, spondylolisthesis, herniated disc, radiculopathy, lumbago  POST-OPERATIVE DIAGNOSIS:  Thoacolumbar Scoliosis due to degenerative disease of spine with stenosis, spondylolisthesis, herniated disc, radiculopathy, lumbago  PROCEDURE:  Procedure(s) with comments: Thoracic Eight to Pelvis fixation and pedicle screws  Transforaminal Lumbar Interbody Fusion Lumbar Four-Five with possible osteotomy (N/A) - Thoracic Eight to Pelvis fixation and pedicle screws  Transforaminal Lumbar Interbody Fusion Lumbar Four-Five with Charline Bills osteotomy, posterolateral arthrodesis T 8 - S 1 APPLICATION OF INTRAOPERATIVE CT SCAN (N/A) - APPLICATION OF INTRAOPERATIVE CT SCAN  SURGEON:  Surgeon(s) and Role:    Erline Levine, MD - Primary    * Earnie Larsson, MD - Assisting  PHYSICIAN ASSISTANT:   ASSISTANTS: Poteat, RN   ANESTHESIA:   general  EBL:  800 mL   BLOOD ADMINISTERED:125 CC CELLSAVER and 2 units PRBCs  DRAINS: (Medium) Hemovact drain(s) in the paravertebral space with  Suction Open   LOCAL MEDICATIONS USED:  MARCAINE    and LIDOCAINE   SPECIMEN:  No Specimen  DISPOSITION OF SPECIMEN:  N/A  COUNTS:  YES  TOURNIQUET:  * No tourniquets in log *  DICTATION: Patient is 62 year old man who has undergone Stage I ALIF and XLIF yesterday who now presents for Stage II decompression and fusion T 8 to Ilium with TLIF at L 4 5 and osteotomies.  It was elected to take him to surgery for fusion from T 8 through ilium with Airo intraoperative CT navigation.  H has severe scoliosis with significant persistent sagittal imbalance despite anterior and lateral correction done yesterday.   Procedure: Patient was placed in a prone position on the [place] table attachment for the Airo after smooth and uncomplicated induction of general endotracheal  anesthesia. His lback was shaved and prepped and draped in usual sterile fashion with betadine scrub and DuraPrep. Area of incision was infiltrated with local lidocaine. Incision was made to the lumbodorsal fascia was incised and exposure was performed of the T  6 to the ilium bilaterally. After localizing X ray was performed, posterior elements and facet joints at L 45 were removed along with facet joints, laminae and spinous processes and bone was saved for grafting.  A TLIF was performed at L 45 on the right with an 8x 9 x 25 mm x 5 degree  Coroent Large oblique cage and 10 cc autograft.  The posterolateral region was extensively decorticated and pedicle screws were placed at T 8  through ilium bilaterally using intraoperative navigation with CT scanner.  At T 8- T 10 6.5 x 35 mm screws were placed,T 11 right 6.5 x 40, T 11 left 6.5 x 45, 12: 6.5 x 40bilaterally; L 1 bilaterally 5.5 x 45; L 2 5.5 x 45, L 2 - L 4 6.5 x 45,; L 5 and S 1: 7.5 x 40; Ilium 8.5 x 70 left and 8.5 x 70 right.  All screws were confirmed with CT scan.   Offset connectors were affixed to the Iliac screw heads.    A in-situ derotation was performed with rod benders on the right and Bendini rod bending system was utilized and rods were bent, affixed to screw heads and locked down with additional correction of coronal and sagittal curves.   Autograft was harvested from both ilia for use in fusion and the spinous processes and laminae and facetectomies along with marrow  rich blood along with 120 cc of allograft chips in the decorticated posterolateral region. The wound was irrigated. A medium Hemovac drain was placed. Long-acting Marcaine was injected into the paraspinous musculature.  Fascia was closed with 1 Vicryl sutures skin edges were reapproximated 2 and 3-0 Vicryl sutures. The wound was dressed with Dermabond and an occlusive dressing.  the patient was extubated in the operating room and taken to recovery in stable satisfactory  condition she tolerated the operation well counts were correct at the end of the case.  PLAN OF CARE: Admit to inpatient   PATIENT DISPOSITION:  PACU - hemodynamically stable.   Delay start of Pharmacological VTE agent (>24hrs) due to surgical blood loss or risk of bleeding: yes

## 2017-10-19 NOTE — Progress Notes (Signed)
Report given to receiving OR nurse. All questions are answered and patient is on his way to pre-op. Ancef is sent with patient, receiving nurse is aware. Patient is alert and orientated with no complaints.

## 2017-10-19 NOTE — Transfer of Care (Signed)
Immediate Anesthesia Transfer of Care Note  Patient: MARCELLOUS SNARSKI  Procedure(s) Performed: Thoracic Eight to Pelvis fixation and pedicle screws  Transforaminal Lumbar Interbody Fusion Lumbar Four-Five with possible osteotomy (N/A Back) APPLICATION OF INTRAOPERATIVE CT SCAN (N/A )  Patient Location: PACU  Anesthesia Type:General  Level of Consciousness: awake, alert , oriented and sedated  Airway & Oxygen Therapy: Patient Spontanous Breathing and Patient connected to face mask oxygen  Post-op Assessment: Report given to RN, Post -op Vital signs reviewed and stable and Patient moving all extremities  Post vital signs: Reviewed and stable  Last Vitals:  Vitals:   10/18/17 2323 10/19/17 0339  BP: 118/74 140/76  Pulse: 81 80  Resp: 15 16  Temp: 37.1 C 37.1 C  SpO2: 100% 99%    Last Pain:  Vitals:   10/19/17 0536  TempSrc:   PainSc: Asleep      Patients Stated Pain Goal: 4 (27/06/23 7628)  Complications: No apparent anesthesia complications

## 2017-10-19 NOTE — Progress Notes (Addendum)
Patient ID: Bobby [name], male   DOB: 07-11-1956, 62 y.o.   MRN: [id number] Awake, alert. MAEW. Reports back pain, as expected; denies leg pain/numbness. Good strength BLE. Doing well.

## 2017-10-19 NOTE — Anesthesia Preprocedure Evaluation (Signed)
Anesthesia Evaluation  Patient identified by MRN, date of birth, ID band Patient awake    Reviewed: Allergy & Precautions, NPO status , Patient's Chart, lab work & pertinent test results  History of Anesthesia Complications Negative for: history of anesthetic complications  Airway Mallampati: II  TM Distance: >3 FB Neck ROM: Full    Dental  (+) Dental Advisory Given   Pulmonary sleep apnea (pt denies, does not use CPAP) , former smoker,    breath sounds clear to auscultation       Cardiovascular hypertension, Pt. on medications (-) angina Rhythm:Regular Rate:Normal     Neuro/Psych Scoliosis: s/p anterior repair yesterday negative psych ROS   GI/Hepatic Neg liver ROS, GERD  Poorly Controlled,  Endo/Other  Morbid obesity  Renal/GU negative Renal ROS     Musculoskeletal  (+) Arthritis ,   Abdominal (+) + obese,   Peds  Hematology negative hematology ROS (+)   Anesthesia Other Findings   Reproductive/Obstetrics                             Anesthesia Physical Anesthesia Plan  ASA: III  Anesthesia Plan: General   Post-op Pain Management:    Induction: Intravenous  PONV Risk Score and Plan: 3 and Ondansetron, Dexamethasone, Midazolam and Scopolamine patch - Pre-op  Airway Management Planned: Oral ETT  Additional Equipment: Arterial line  Intra-op Plan:   Post-operative Plan: Extubation in OR  Informed Consent: I have reviewed the patients History and Physical, chart, labs and discussed the procedure including the risks, benefits and alternatives for the proposed anesthesia with the patient or authorized representative who has indicated his/her understanding and acceptance.   Dental advisory given  Plan Discussed with: CRNA and Surgeon  Anesthesia Plan Comments: (Plan routine monitors, existing A line, GETA )        Anesthesia Quick Evaluation

## 2017-10-19 NOTE — Anesthesia Procedure Notes (Signed)
Procedure Name: Intubation Date/Time: 10/19/2017 7:30 AM Performed by: Scheryl Darter, CRNA Pre-anesthesia Checklist: Patient identified, Emergency Drugs available, Suction available and Patient being monitored Patient Re-evaluated:Patient Re-evaluated prior to induction Oxygen Delivery Method: Circle System Utilized Preoxygenation: Pre-oxygenation with 100% oxygen Induction Type: IV induction Ventilation: Mask ventilation without difficulty Laryngoscope Size: Mac and 4 Grade View: Grade II Tube type: Oral Tube size: 7.5 mm Number of attempts: 1 Airway Equipment and Method: Stylet and Oral airway Placement Confirmation: ETT inserted through vocal cords under direct vision,  positive ETCO2 and breath sounds checked- equal and bilateral Secured at: 24 cm Tube secured with: Tape Dental Injury: Teeth and Oropharynx as per pre-operative assessment

## 2017-10-19 NOTE — Anesthesia Postprocedure Evaluation (Signed)
Anesthesia Post Note  Patient: Bobby [name]  Procedure(s) Performed: Thoracic Eight to Pelvis fixation and pedicle screws  Transforaminal Lumbar Interbody Fusion Lumbar Four-Five with possible osteotomy (N/A Back) APPLICATION OF INTRAOPERATIVE CT SCAN (N/A )     Patient location during evaluation: PACU Anesthesia Type: General Level of consciousness: awake and alert Pain management: pain level controlled Vital Signs Assessment: post-procedure vital signs reviewed and stable Respiratory status: spontaneous breathing, nonlabored ventilation, respiratory function stable and patient connected to nasal cannula oxygen Cardiovascular status: blood pressure returned to baseline and stable Postop Assessment: no apparent nausea or vomiting Anesthetic complications: no    Last Vitals:  Vitals:   10/19/17 1845 10/19/17 1900  BP: (!) 99/58 103/67  Pulse: (!) 102 93  Resp:    Temp:    SpO2: 98% 99%    Last Pain:  Vitals:   10/19/17 1740  TempSrc:   PainSc: 8                  Ladaysha Soutar COKER

## 2017-10-19 NOTE — Progress Notes (Signed)
Received report on patient by Butch Penny, RN. Patient went off the unit for surgery around 6:30am. Will assess and medicate patient when he returns to the unit.

## 2017-10-19 NOTE — Interval H&P Note (Signed)
History and Physical Interval Note:  10/19/2017 6:54 AM  Bobby [name]  has presented today for surgery, with the diagnosis of Scoliosis due to degenerative disease of spine  The various methods of treatment have been discussed with the patient and family. After consideration of risks, benefits and other options for treatment, the patient has consented to  Procedure(s) with comments: T9 to Pelvis fixation (N/A) - T9 to Pelvis fixation with L 45 PLIF and possible osteotomies APPLICATION OF INTRAOPERATIVE CT SCAN (N/A) as a surgical intervention .  The patient's history has been reviewed, patient examined, no change in status, stable for surgery.  I have reviewed the patient's chart and labs.  Questions were answered to the patient's satisfaction.     Trisha Ken D

## 2017-10-20 ENCOUNTER — Encounter (HOSPITAL_COMMUNITY): Payer: Self-pay | Admitting: Neurosurgery

## 2017-10-20 LAB — TYPE AND SCREEN
ABO/RH(D): A POS
ANTIBODY SCREEN: NEGATIVE
UNIT DIVISION: 0
Unit division: 0

## 2017-10-20 LAB — BPAM RBC
BLOOD PRODUCT EXPIRATION DATE: 201902032359
Blood Product Expiration Date: 201902042359
ISSUE DATE / TIME: 201901151027
ISSUE DATE / TIME: 201901151027
UNIT TYPE AND RH: 6200
Unit Type and Rh: 6200

## 2017-10-20 LAB — POCT I-STAT 7, (LYTES, BLD GAS, ICA,H+H)
ACID-BASE DEFICIT: 5 mmol/L — AB (ref 0.0–2.0)
Acid-Base Excess: 2 mmol/L (ref 0.0–2.0)
BICARBONATE: 27 mmol/L (ref 20.0–28.0)
Bicarbonate: 19.5 mmol/L — ABNORMAL LOW (ref 20.0–28.0)
CALCIUM ION: 1.15 mmol/L (ref 1.15–1.40)
Calcium, Ion: 1.23 mmol/L (ref 1.15–1.40)
HCT: 38 % — ABNORMAL LOW (ref 39.0–52.0)
HCT: 33 % — ABNORMAL LOW (ref 39.0–52.0)
HEMOGLOBIN: 11.2 g/dL — AB (ref 13.0–17.0)
Hemoglobin: 12.9 g/dL — ABNORMAL LOW (ref 13.0–17.0)
O2 SAT: 100 %
O2 Saturation: 99 %
PCO2 ART: 39.8 mmHg (ref 32.0–48.0)
PH ART: 7.403 (ref 7.350–7.450)
PO2 ART: 277 mmHg — AB (ref 83.0–108.0)
Potassium: 3.7 mmol/L (ref 3.5–5.1)
Potassium: 3.2 mmol/L — ABNORMAL LOW (ref 3.5–5.1)
SODIUM: 138 mmol/L (ref 135–145)
Sodium: 137 mmol/L (ref 135–145)
TCO2: 28 mmol/L (ref 22–32)
TCO2: 20 mmol/L — ABNORMAL LOW (ref 22–32)
pCO2 arterial: 30.9 mmHg — ABNORMAL LOW (ref 32.0–48.0)
pH, Arterial: 7.435 (ref 7.350–7.450)
pO2, Arterial: 137 mmHg — ABNORMAL HIGH (ref 83.0–108.0)

## 2017-10-20 LAB — CBC WITH DIFFERENTIAL/PLATELET
Basophils Absolute: 0 10*3/uL (ref 0.0–0.1)
Basophils Relative: 0 %
EOS ABS: 0 10*3/uL (ref 0.0–0.7)
EOS PCT: 0 %
HCT: 27.9 % — ABNORMAL LOW (ref 39.0–52.0)
Hemoglobin: 9.4 g/dL — ABNORMAL LOW (ref 13.0–17.0)
LYMPHS ABS: 0.9 10*3/uL (ref 0.7–4.0)
LYMPHS PCT: 9 %
MCH: 29.8 pg (ref 26.0–34.0)
MCHC: 33.7 g/dL (ref 30.0–36.0)
MCV: 88.6 fL (ref 78.0–100.0)
MONO ABS: 2.2 10*3/uL — AB (ref 0.1–1.0)
MONOS PCT: 22 %
Neutro Abs: 6.9 10*3/uL (ref 1.7–7.7)
Neutrophils Relative %: 69 %
PLATELETS: 111 10*3/uL — AB (ref 150–400)
RBC: 3.15 MIL/uL — AB (ref 4.22–5.81)
RDW: 15.5 % (ref 11.5–15.5)
WBC: 10 10*3/uL (ref 4.0–10.5)

## 2017-10-20 MED ORDER — BISACODYL 10 MG RE SUPP
10.0000 mg | Freq: Every day | RECTAL | Status: DC
  Filled 2017-10-20: qty 1

## 2017-10-20 MED ORDER — BISACODYL 10 MG RE SUPP: 10.0000 mg | Freq: Every day | RECTAL | Status: DC | PRN

## 2017-10-20 MED ORDER — SODIUM CHLORIDE 0.9 % IV BOLUS (SEPSIS)
500.0000 mL | Freq: Once | INTRAVENOUS | Status: AC
  Administered 2017-10-20: 500 mL via INTRAVENOUS

## 2017-10-20 MED ORDER — SODIUM CHLORIDE 0.9 % IV SOLN
25.0000 mg | Freq: Four times a day (QID) | INTRAVENOUS | Status: DC | PRN
  Administered 2017-10-21: 25 mg via INTRAVENOUS
  Filled 2017-10-20: qty 1

## 2017-10-20 MED FILL — Sodium Chloride IV Soln 0.9%: INTRAVENOUS | Qty: 1000 | Status: AC

## 2017-10-20 MED FILL — Heparin Sodium (Porcine) Inj 1000 Unit/ML: INTRAMUSCULAR | Qty: 30 | Status: AC

## 2017-10-20 NOTE — Progress Notes (Signed)
OT Cancellation Note  Patient Details Name: CAMPBELL KRAY MRN: [id number] DOB: November 02, 1955   Cancelled Treatment:    Reason Eval/Treat Not Completed: Other (comment)(no brace present; RN to call [employer]otech)  Binnie Kand M.S., OTR/L Pager: [phone]  10/20/2017, 9:44 AM

## 2017-10-20 NOTE — Addendum Note (Signed)
Addendum  created 10/20/17 0846 by Scheryl Darter, CRNA   Intraprocedure Staff edited

## 2017-10-20 NOTE — Evaluation (Signed)
Physical Therapy Evaluation Patient Details Name: Bobby [name] MRN: [id number] DOB: Apr 16, 1956 Today's Date: 10/20/2017   History of Present Illness  Pt is a 62 y.o. male s/p L5-S1 ALIF, L1-5 XLIF, T8-pelvis TLIF. PMHx: HTN.  Clinical Impression  Patient is s/p above surgery resulting in the deficits listed below (see PT Problem List). Pt lethargic this date with limited ability to follow commands, poor carryover of back precautions, and required mod/maxA for mobility. Patient will benefit from skilled PT to increase their independence and safety with mobility (while adhering to their precautions) to allow discharge to the venue listed below.     Follow Up Recommendations Home health PT;Supervision/Assistance - 24 hour (may need SNF pending progress)    Equipment Recommendations  Rolling walker with 5" wheels    Recommendations for Other Services       Precautions / Restrictions Precautions Precautions: Back;Fall Precaution Booklet Issued: Yes (comment) Precaution Comments: Educated pt on back precautions Required Braces or Orthoses: Spinal Brace Spinal Brace: Lumbar corset;Applied in sitting position Restrictions Weight Bearing Restrictions: No      Mobility  Bed Mobility Overal bed mobility: Needs Assistance Bed Mobility: Rolling;Sidelying to Sit Rolling: Max assist Sidelying to sit: Mod assist       General bed mobility comments: Max cues for initiation, sequencing, and safety with log roll technique. Use of bed pad to roll pt into sidelying, assist for LEs off bed and trunk elevation to sitting  Transfers Overall transfer level: Needs assistance Equipment used: 2 person hand held assist Transfers: Sit to/from [facility] Sit to Stand: Min assist;+2 physical assistance Stand pivot transfers: Mod assist;+2 physical assistance       General transfer comment: Max cues for sequencing and technique. Verbal cues to use abdominal muscles with sit to  stand from EOB. Cues for stepping to R side for pivot transfer. Bil knee buckling noted and pt with poor attention to functional task.  Ambulation/Gait             General Gait Details: pt unable to ambulate, pt with bilat knee buckling and unable to sequence step to chair. pt very unsteady  Financial trader [employer] (Stroke Patients Only)       Balance Overall balance assessment: Needs assistance Sitting-balance support: Feet supported;Bilateral upper extremity supported Sitting balance-Leahy Scale: Poor Sitting balance - Comments: min guard to mod assist for sitting balance with posterior lean thorughout Postural control: Posterior lean Standing balance support: Bilateral upper extremity supported Standing balance-Leahy Scale: Poor                               Pertinent Vitals/Pain Pain Assessment: 0-10 Pain Score: 8  Pain Location: back, incision Pain Descriptors / Indicators: Aching Pain Intervention(s): Monitored during session;Limited activity within patient's tolerance;Repositioned;Premedicated before session    Home Living Family/patient expects to be discharged to:: Private residence Living Arrangements: Spouse/significant other Available Help at Discharge: Family;Available 24 hours/day Type of Home: House Home Access: Stairs to enter   [employer] of Steps: 3 Home Layout: One level Home Equipment: None      Prior Function Level of Independence: Independent               Hand Dominance        Extremity/Trunk Assessment   Upper Extremity Assessment Upper Extremity Assessment: Generalized weakness  Lower Extremity Assessment Lower Extremity Assessment: Generalized weakness    Cervical / Trunk Assessment Cervical / Trunk Assessment: Other exceptions Cervical / Trunk Exceptions: s/p spine sx  Communication   Communication: No difficulties  Cognition Arousal/Alertness:  Awake/alert Behavior During Therapy: Flat affect Overall Cognitive Status: Impaired/Different from baseline Area of Impairment: Attention;Memory;Following commands;Safety/judgement;Awareness;Problem solving                   Current Attention Level: Sustained Memory: Decreased recall of precautions;Decreased short-term memory Following Commands: Follows one step commands inconsistently Safety/Judgement: Decreased awareness of safety Awareness: Intellectual Problem Solving: Slow processing;Decreased initiation;Difficulty sequencing;Requires verbal cues;Requires tactile cues General Comments: pt difficulty sequencing stepping pattern and demo'd no carryover of back precautions      General Comments General comments (skin integrity, edema, etc.): pt unable to follow instructions or maintain balance at EOB to assist with donning of brace    Exercises     Assessment/Plan    PT Assessment Patient needs continued PT services  PT Problem List Decreased strength;Decreased range of motion;Decreased activity tolerance;Decreased balance;Decreased mobility;Decreased coordination       PT Treatment Interventions DME instruction;Gait training;Stair training;Functional mobility training;Therapeutic activities;Therapeutic exercise;Balance training    PT Goals (Current goals can be found in the Care Plan section)  Acute Rehab PT Goals Patient Stated Goal: none stated PT Goal Formulation: With patient Time For Goal Achievement: 11/03/17 Potential to Achieve Goals: Good    Frequency Min 5X/week   Barriers to discharge        Co-evaluation PT/OT/SLP Co-Evaluation/Treatment: Yes Reason for Co-Treatment: Complexity of the patient's impairments (multi-system involvement);For patient/therapist safety PT goals addressed during session: Mobility/safety with mobility OT goals addressed during session: ADL's and self-care       AM-PAC PT "6 Clicks" Daily Activity  Outcome Measure  Difficulty turning over in bed (including adjusting bedclothes, sheets and blankets)?: Unable Difficulty moving from lying on back to sitting on the side of the bed? : Unable Difficulty sitting down on and standing up from a chair with arms (e.g., wheelchair, bedside commode, etc,.)?: Unable Help needed moving to and from a bed to chair (including a wheelchair)?: A Lot Help needed walking in hospital room?: A Lot Help needed climbing 3-5 steps with a railing? : Total 6 Click Score: 8    End of Session Equipment Utilized During Treatment: Gait belt;Back brace Activity Tolerance: Patient limited by lethargy Patient left: in chair;with call bell/phone within reach;with chair alarm set Nurse Communication: Mobility status(lethargy) PT Visit Diagnosis: Unsteadiness on feet (R26.81);Pain Pain - part of body: (back)    Time: 3500-9381 PT Time Calculation (min) (ACUTE ONLY): 23 min   Charges:   PT Evaluation $PT Eval Moderate Complexity: 1 Mod     PT G Codes:        Kittie Plater, PT, DPT Pager #: [phone] Office #: [phone]   Jakeline Dave M Rider Ermis 10/20/2017, 3:06 PM

## 2017-10-20 NOTE — Progress Notes (Signed)
Orthopedic Tech Progress Note Patient Details:  MANDRELL VANGILDER [birth year] [id number]  Patient ID: Hilaria Ota, male   DOB: Sep 10, 1956, 62 y.o.   MRN: [id number]   Hildred Priest 10/20/2017, 10:44 AM Called in bio-tech brace order; spoke with Bella Kennedy

## 2017-10-20 NOTE — Care Management Note (Signed)
Case Management Note  Patient Details  Name: Bobby [name] MRN: [id number] Date of Birth: 1956/07/14  Subjective/Objective:   Admitted for Transforaminal Lumbar Interbody Fusion            Action/Plan: In to speak with patient, prior to admission patient lived at home with spouse.  Copy in [place] [facility].  PCP is Glennon Mac.  Denies inability to afford medications or food.  Has never used an [facility] in the past; would like to review list if [facility] is needed.  Patient states him and wife do the cooking. Wife is available to take him to medical appointments.  Home DME: None.  NCM will continue to follow for discharge transition home.  Status of Service:  In process, will continue to follow  Montel Culver, BSN, RN Nurse Case Manager [phone] 10/20/2017, 11:51 AM

## 2017-10-20 NOTE — Evaluation (Signed)
Occupational Therapy Evaluation Patient Details Name: Bobby [name] MRN: [id number] DOB: 15-Feb-1956 Today's Date: 10/20/2017    History of Present Illness Pt is a 62 y.o. male s/p L5-S1 ALIF, L1-5 XLIF, T8-pelvis TLIF. PMHx: HTN.   Clinical Impression   Pt reports he was independent with ADL PTA. Currently pt requires max assist overall for ADL and mod assist +2 for stand pivot transfer. Pt with difficulty sequencing and initiating functional tasks; ?pain meds. Began back, safety, and ADL education with pt however he will need further education prior to d/c. Pt planning to d/c home with supervision from family. Recommending HHOT for follow up to maximize independence and safety with ADL and functional mobility. Pending progress, pt may benefit from post acute rehab prior to return home. Pt would benefit from continued skilled OT to address established goals.    Follow Up Recommendations  Home health OT;Supervision/Assistance - 24 hour(may need SNF pending progress)    Equipment Recommendations  3 in 1 bedside commode;Tub/shower bench    Recommendations for Other Services       Precautions / Restrictions Precautions Precautions: Back;Fall Precaution Booklet Issued: Yes (comment) Precaution Comments: Educated pt on back precautions Required Braces or Orthoses: Spinal Brace Spinal Brace: Lumbar corset;Applied in sitting position Restrictions Weight Bearing Restrictions: No      Mobility Bed Mobility Overal bed mobility: Needs Assistance Bed Mobility: Rolling;Sidelying to Sit Rolling: Max assist Sidelying to sit: Mod assist       General bed mobility comments: Max cues for initiation, sequencing, and safety with log roll technique. Use of bed pad to roll pt into sidelying, assist for LEs off bed and trunk elevation to sitting  Transfers Overall transfer level: Needs assistance Equipment used: 2 person hand held assist Transfers: Sit to/from [facility] Sit  to Stand: Min assist;+2 physical assistance Stand pivot transfers: Mod assist;+2 physical assistance       General transfer comment: Max cues for sequencing and technique. Verbal cues to use abdominal muscles with sit to stand from EOB. Cues for stepping to R side for pivot transfer. Bil knee buckling noted and pt with poor attention to functional task.    Balance Overall balance assessment: Needs assistance Sitting-balance support: Feet supported;Bilateral upper extremity supported Sitting balance-Leahy Scale: Poor Sitting balance - Comments: min guard to mod assist for sitting balance with posterior lean thorughout Postural control: Posterior lean Standing balance support: Bilateral upper extremity supported Standing balance-Leahy Scale: Poor                             ADL either performed or assessed with clinical judgement   ADL Overall ADL's : Needs assistance/impaired Eating/Feeding: Minimal assistance;Sitting   Grooming: Minimal assistance;Sitting   Upper Body Bathing: Moderate assistance;Sitting   Lower Body Bathing: Maximal assistance;Sit to/from stand   Upper Body Dressing : Maximal assistance;Sitting Upper Body Dressing Details (indicate cue type and reason): for brace management Lower Body Dressing: Maximal assistance;Sit to/from stand   Toilet Transfer: Moderate assistance;+2 for physical assistance;Stand-pivot Toilet Transfer Details (indicate cue type and reason): Simulated by transfer EOB to chair         Functional mobility during ADLs: Moderate assistance;+2 for physical assistance(for stand pivot only)       Vision         Perception     Praxis      Pertinent Vitals/Pain Pain Assessment: 0-10 Pain Score: 8  Pain Location: back, incision Pain Descriptors /  Indicators: Aching Pain Intervention(s): Monitored during session;Limited activity within patient's tolerance;Repositioned;Premedicated before session     Hand Dominance      Extremity/Trunk Assessment Upper Extremity Assessment Upper Extremity Assessment: Generalized weakness   Lower Extremity Assessment Lower Extremity Assessment: Defer to PT evaluation   Cervical / Trunk Assessment Cervical / Trunk Assessment: Other exceptions Cervical / Trunk Exceptions: s/p spine sx   Communication Communication Communication: No difficulties   Cognition Arousal/Alertness: Awake/alert Behavior During Therapy: Flat affect Overall Cognitive Status: Impaired/Different from baseline Area of Impairment: Attention;Memory;Following commands;Safety/judgement;Awareness;Problem solving                   Current Attention Level: Sustained Memory: Decreased recall of precautions;Decreased short-term memory Following Commands: Follows one step commands inconsistently Safety/Judgement: Decreased awareness of safety Awareness: Intellectual Problem Solving: Slow processing;Decreased initiation;Difficulty sequencing;Requires verbal cues;Requires tactile cues     General Comments       Exercises     Shoulder Instructions      Home Living Family/patient expects to be discharged to:: Private residence Living Arrangements: Spouse/significant other Available Help at Discharge: Family;Available 24 hours/day Type of Home: House Home Access: Stairs to enter [employer] of Steps: 3   Home Layout: One level     Bathroom Shower/Tub: Corporate investment banker: Standard     Home Equipment: None          Prior Functioning/Environment Level of Independence: Independent                 OT Problem List: Decreased strength;Decreased activity tolerance;Impaired balance (sitting and/or standing);Decreased safety awareness;Decreased cognition;Decreased knowledge of use of DME or AE;Decreased knowledge of precautions;Obesity;Pain      OT Treatment/Interventions: Self-care/ADL training;Therapeutic exercise;Energy conservation;DME and/or  AE instruction;Therapeutic activities;Cognitive remediation/compensation;Patient/family education;Balance training    OT Goals(Current goals can be found in the care plan section) Acute Rehab OT Goals Patient Stated Goal: none stated OT Goal Formulation: With patient Time For Goal Achievement: 11/03/17 Potential to Achieve Goals: Good ADL Goals Pt Will Perform Lower Body Bathing: with min assist;sit to/from stand(with or without AE) Pt Will Perform Lower Body Dressing: with min assist;sit to/from stand(with or without AE) Pt Will Transfer to Toilet: with min guard assist;ambulating;bedside commode Pt Will Perform Toileting - Clothing Manipulation and hygiene: with min guard assist;sit to/from stand Pt Will Perform Tub/Shower Transfer: Tub transfer;with min assist;ambulating;rolling walker(3in1 vs tub bench) Additional ADL Goal #1: Pt will independently verbally recall 3/3 back precautions and maintain throughout ADL. Additional ADL Goal #2: Pt will don/doff back brace with set up as precursor to ADL.  OT Frequency: Min 2X/week   Barriers to D/C:            Co-evaluation PT/OT/SLP Co-Evaluation/Treatment: Yes Reason for Co-Treatment: Complexity of the patient's impairments (multi-system involvement);Necessary to address cognition/behavior during functional activity;For patient/therapist safety;To address functional/ADL transfers   OT goals addressed during session: ADL's and self-care      AM-PAC PT "6 Clicks" Daily Activity     Outcome Measure Help from another person eating meals?: A Little Help from another person taking care of personal grooming?: A Little Help from another person toileting, which includes using toliet, bedpan, or urinal?: A Lot Help from another person bathing (including washing, rinsing, drying)?: A Lot Help from another person to put on and taking off regular upper body clothing?: A Lot Help from another person to put on and taking off regular lower body  clothing?: A Lot 6 Click Score: 14  End of Session Equipment Utilized During Treatment: Gait belt;Back brace Nurse Communication: Mobility status  Activity Tolerance: Patient tolerated treatment well Patient left: in chair;with call bell/phone within reach;with chair alarm set  OT Visit Diagnosis: Unsteadiness on feet (R26.81);Other abnormalities of gait and mobility (R26.89);Pain Pain - part of body: (back)                Time: 6808-8110 OT Time Calculation (min): 26 min Charges:  OT General Charges $OT Visit: 1 Visit OT Evaluation $OT Eval Moderate Complexity: 1 Mod G-Codes:     Kya Mayfield A. Ulice Brilliant, M.S., OTR/L Pager: [place] 10/20/2017, 2:44 PM

## 2017-10-20 NOTE — Progress Notes (Addendum)
Subjective: Patient reports "My back hurts...my legs are ok"  Objective: Vital signs in last 24 hours: Temp:  [97 F (36.1 C)-98.7 F (37.1 C)] 98.5 F (36.9 C) (01/16 0358) Pulse Rate:  [83-113] 98 (01/16 0551) Resp:  [0-24] 21 (01/15 1712) BP: (81-114)/(57-82) 102/68 (01/16 0551) SpO2:  [89 %-100 %] 89 % (01/16 0551) Arterial Line BP: (76-92)/(57-63) 91/58 (01/15 1630)  Intake/Output from previous day: 01/15 0701 - 01/16 0700 In: 6426 [P.O.:481; I.V.:4185; Blood:800; IV Piggyback:960] Out: 3200 [Urine:2000; Drains:400; Blood:800] Intake/Output this shift: No intake/output data recorded.  Awakens to voice. Reports some back pain, but requests no meds at present. Good strength BLE. Able to logroll with minimal assistance and little pain. Pain overnight, but soft BP's limited pain medication. Belly tight, but nontender. Pt denies discomfort. T-L incision, left side incisions and left abdomen incisions without erythema, swelling, or drainage beneath honeycomb and Dermabond drsgs. Hemovac patent.  Lab Results: Recent Labs    10/19/17 1013  HGB 8.8*  HCT 26.0*   BMET Recent Labs    10/19/17 1013  NA 137  K 4.1  GLUCOSE 118*    Studies/Results: Dg Lumbar Spine 2-3 Views  Result Date: 10/19/2017 CLINICAL DATA:  Localization and Assistance with Transforaminal Lumbar Four-Five Interbody Placement. EXAM: LUMBAR SPINE - 2-3 VIEW COMPARISON:  10/18/2017 FINDINGS: Initial image shows placement of probes from a posterior approach, the more superior superimposed over the L4 spinous process the more inferior superimposed over the posterior pedicle region of L5. Followup image shows placement of a surgical instrument into the L4-L5 disc interspace. Subsequent image shows placement of a radiolucent disc spacer at the L4-L5 level, in the central to posterior aspect of the disc interspace. Changes from the previous interbody fusions at L1-L2, L2-L3, L3-L4 and L5-S1 are stable. IMPRESSION:  Surgical localization imaging for L4-L5 interbody fusion. Electronically Signed   By: Lajean Manes M.D.   On: 10/19/2017 15:37   Dg Lumbar Spine Complete  Result Date: 10/18/2017 CLINICAL DATA:  L1 through 4 XLIF, L5-S1 ALIF. Reported fluoro time is 5 minutes, 28 seconds. EXAM: LUMBAR SPINE - COMPLETE 4+ VIEW; DG C-ARM 61-120 MIN COMPARISON:  Lateral views of the lumbar spine of Feb 19, 2016 FINDINGS: Five fluoro spot images are reviewed. The patient is undergone cage fusion device placement at L1-2, L2-3, and L3-4. The patient has undergone a fusion device placement at L5-S1. No immediate postprocedure complications are observed. IMPRESSION: No immediate postprocedure complication following spinal fusion procedure. Electronically Signed   By: David  Martinique M.D.   On: 10/18/2017 13:07   Dg C-arm 1-60 Min  Result Date: 10/18/2017 CLINICAL DATA:  L1 through 4 XLIF, L5-S1 ALIF. Reported fluoro time is 5 minutes, 28 seconds. EXAM: LUMBAR SPINE - COMPLETE 4+ VIEW; DG C-ARM 61-120 MIN COMPARISON:  Lateral views of the lumbar spine of Feb 19, 2016 FINDINGS: Five fluoro spot images are reviewed. The patient is undergone cage fusion device placement at L1-2, L2-3, and L3-4. The patient has undergone a fusion device placement at L5-S1. No immediate postprocedure complications are observed. IMPRESSION: No immediate postprocedure complication following spinal fusion procedure. Electronically Signed   By: David  Martinique M.D.   On: 10/18/2017 13:07   Dg C-arm 1-60 Min  Result Date: 10/18/2017 CLINICAL DATA:  L1 through 4 XLIF, L5-S1 ALIF. Reported fluoro time is 5 minutes, 28 seconds. EXAM: LUMBAR SPINE - COMPLETE 4+ VIEW; DG C-ARM 61-120 MIN COMPARISON:  Lateral views of the lumbar spine of Feb 19, 2016 FINDINGS: Five  fluoro spot images are reviewed. The patient is undergone cage fusion device placement at L1-2, L2-3, and L3-4. The patient has undergone a fusion device placement at L5-S1. No immediate postprocedure  complications are observed. IMPRESSION: No immediate postprocedure complication following spinal fusion procedure. Electronically Signed   By: David  Martinique M.D.   On: 10/18/2017 13:07   Dg C-arm 1-60 Min  Result Date: 10/18/2017 CLINICAL DATA:  L1 through 4 XLIF, L5-S1 ALIF. Reported fluoro time is 5 minutes, 28 seconds. EXAM: LUMBAR SPINE - COMPLETE 4+ VIEW; DG C-ARM 61-120 MIN COMPARISON:  Lateral views of the lumbar spine of Feb 19, 2016 FINDINGS: Five fluoro spot images are reviewed. The patient is undergone cage fusion device placement at L1-2, L2-3, and L3-4. The patient has undergone a fusion device placement at L5-S1. No immediate postprocedure complications are observed. IMPRESSION: No immediate postprocedure complication following spinal fusion procedure. Electronically Signed   By: David  Martinique M.D.   On: 10/18/2017 13:07   Dg Scoliosis Eval Complete Spine 2 Or 3 Views  Result Date: 10/18/2017 CLINICAL DATA:  Scoliosis EXAM: DG SCOLIOSIS EVAL COMPLETE SPINE 2-3V COMPARISON:  Radiographs 04/13/2017, 02/19/2016 FINDINGS: Dextroscoliosis of the midthoracic spine, measuring 34 degrees from superior endplate of T4 to inferior endplate of J18. Levoscoliosis of the thoracolumbar spine, measuring 37 degrees, measured from superior endplate of T9 to inferior endplate of L3. Surgical plate and screw fixation at C7 and T1. Interbody devices at L1-L2, L2-L3 and L3-L4. Anterior fixation at L5-S1 with interbody device also present. Sagittal view demonstrates cervical lordosis, mild thoracic kyphosis and slight straightening of the lumbar lordosis. IMPRESSION: Thoraco lumbar scoliosis as above with multilevel postoperative changes. Electronically Signed   By: Donavan Foil M.D.   On: 10/18/2017 23:50   Dg Or Local Abdomen  Result Date: 10/18/2017 CLINICAL DATA:  L5-S1 anterior fusion, instrument count EXAM: OR LOCAL ABDOMEN COMPARISON:  None. FINDINGS: Changes of anterior fusion noted at L5-S1. No visible  retained surgical instruments. IMPRESSION: No visible retained surgical instruments. These results were called to the operating room at the time of interpretation. Electronically Signed   By: Rolm Baptise M.D.   On: 10/18/2017 09:45    Assessment/Plan:   LOS: 2 days  Per DrStern 547ml NS bolus. Mobilize in brace. Pt will request Dulcolax suppository.    PoteatAaron Edelman 10/20/2017, 8:00 AM  Will check CBC today.  Mobilize in brace.

## 2017-10-21 DIAGNOSIS — M5416 Radiculopathy, lumbar region: Secondary | ICD-10-CM

## 2017-10-21 DIAGNOSIS — M4125 Other idiopathic scoliosis, thoracolumbar region: Secondary | ICD-10-CM

## 2017-10-21 NOTE — Progress Notes (Signed)
Occupational Therapy Treatment Patient Details Name: Bobby [name] MRN: [id number] DOB: [birth year] Today's Date: 10/21/2017    History of present illness Pt is a 62 y.o. male s/p L5-S1 ALIF, L1-5 XLIF, T8-pelvis TLIF. PMHx: HTN.   OT comments  Pt continues to require max assist for LB dressing and brace management. Pt able to perform sit to stand at EOB with min assist and side stepping toward [facility] for repositioning with mod assist. Pt requires max verbal cues for safety, sequencing, initiation, and attention to task. Pt does best with slow, direct one step commands. Pt able to recall 3/3 back precautions by end of session. Updated d/c plan to CIR for follow up to maximize independence and safety with ADL and functional mobility prior to return home. Will continue to follow acutely.   Follow Up Recommendations  CIR;Supervision/Assistance - 24 hour    Equipment Recommendations  3 in 1 bedside commode;Tub/shower bench    Recommendations for Other Services      Precautions / Restrictions Precautions Precautions: Back;Fall Precaution Booklet Issued: No Precaution Comments: Educated pt on 3/3 back precautions, by end of session he was able to recall 3/3. Required Braces or Orthoses: Spinal Brace Spinal Brace: Thoracolumbosacral orthotic;Applied in sitting position Restrictions Weight Bearing Restrictions: No       Mobility Bed Mobility Overal bed mobility: Needs Assistance Bed Mobility: Rolling;Sidelying to Sit;Sit to Sidelying Rolling: Min assist Sidelying to sit: Min assist    Sit to sidelying: Min assist General bed mobility comments: Max cues for sequencing log roll technique. HOB flat with use of bed rail.   Transfers Overall transfer level: Needs assistance Equipment used: Rolling walker (2 wheeled) Transfers: Sit to/from Stand Sit to Stand: Min assist        General transfer comment: Min assist to boost up from EOB. Cues for hand placement and sequencing. Pt able  to side step at EOB wtih mod assist to reposition in bed.    Balance Overall balance assessment: Needs assistance Sitting-balance support: Feet supported;Bilateral upper extremity supported Sitting balance-Leahy Scale: Poor Sitting balance - Comments: min-min guard assist for sitting balance with posterior lean Postural control: Posterior lean Standing balance support: Single extremity supported Standing balance-Leahy Scale: Poor Standing balance comment: mod assist for standing balance with single UE support                           ADL either performed or assessed with clinical judgement   ADL Overall ADL's : Needs assistance/impaired                 Upper Body Dressing : Maximal assistance;Sitting Upper Body Dressing Details (indicate cue type and reason): to don/doff brace and gown with max cues to maintain upright sitting posture Lower Body Dressing: Total assistance;Bed level Lower Body Dressing Details (indicate cue type and reason): to don socks               General ADL Comments: Min assist for sit to stand with mod assist for side stepping toward [facility] for repositioning. No knee buckling noted today. Max cues for hand placement, technique, sequencing, and safety.     Vision       Perception     Praxis      Cognition Arousal/Alertness: Awake/alert Behavior During Therapy: WFL for tasks assessed/performed Overall Cognitive Status: Impaired/Different from baseline Area of Impairment: Following commands;Safety/judgement;Awareness;Problem solving  Orientation Level: Disoriented to;Time Current Attention Level: Sustained Memory: Decreased recall of precautions;Decreased short-term memory Following Commands: Follows one step commands inconsistently Safety/Judgement: Decreased awareness of safety;Decreased awareness of deficits Awareness: Intellectual Problem Solving: Decreased initiation;Difficulty sequencing;Requires verbal  cues;Requires tactile cues General Comments: Requires slow and direct one step cues for participation in tasks. Able to recall 3/3 precautions by end of session.          Exercises     Shoulder Instructions       General Comments      Pertinent Vitals/ Pain       Pain Assessment: Faces Pain Score: 7  Faces Pain Scale: Hurts even more Pain Location: back, LLE Pain Descriptors / Indicators: Aching;Sore;Grimacing;Guarding Pain Intervention(s): Limited activity within patient's tolerance;Monitored during session  Home Living                                          Prior Functioning/Environment              Frequency  Min 2X/week        Progress Toward Goals  OT Goals(current goals can now be found in the care plan section)  Progress towards OT goals: Progressing toward goals(minimally)  Acute Rehab OT Goals Patient Stated Goal: get better OT Goal Formulation: With patient  Plan Discharge plan needs to be updated    Co-evaluation                 AM-PAC PT "6 Clicks" Daily Activity     Outcome Measure   Help from another person eating meals?: A Little Help from another person taking care of personal grooming?: A Little Help from another person toileting, which includes using toliet, bedpan, or urinal?: A Lot Help from another person bathing (including washing, rinsing, drying)?: A Lot Help from another person to put on and taking off regular upper body clothing?: A Lot Help from another person to put on and taking off regular lower body clothing?: A Lot 6 Click Score: 14    End of Session Equipment Utilized During Treatment: Rolling walker;Back brace  OT Visit Diagnosis: Unsteadiness on feet (R26.81);Other abnormalities of gait and mobility (R26.89);Pain Pain - Right/Left: Left Pain - part of body: Leg(back)   Activity Tolerance Patient tolerated treatment well   Patient Left in bed;with call bell/phone within reach;with bed  alarm set   Nurse Communication Mobility status        Time: 1050-1109 OT Time Calculation (min): 19 min  Charges: OT General Charges $OT Visit: 1 Visit OT Treatments $Self Care/Home Management : 8-22 mins  Armina Galloway A. Ulice Brilliant, M.S., OTR/L Pager: [place] 10/21/2017, 11:20 AM

## 2017-10-21 NOTE — Progress Notes (Signed)
Inpatient Rehabilitation  Per PT request, patient was screened by Mckyle Solanki for appropriateness for an Inpatient Acute Rehab consult.  At this time we are recommending an Inpatient Rehab consult.  Please order if you are agreeable.    Thien Berka, M.A., CCC/SLP Admission Coordinator  [facility]  Cell [phone]  

## 2017-10-21 NOTE — Progress Notes (Signed)
Dr Vertell Limber gave verbal order to remove drain and notify respiratory regarding patient's home CPAP.   Called respiratory and they agreed to come and see patient regarding home CPAP.  Patient agreed to call spouse and request CPAP to be brought to the hospital.

## 2017-10-21 NOTE — Consult Note (Signed)
Physical Medicine and Rehabilitation Consult   Reason for Consult: Functional deficits Referring Physician: Dr. Vertell Limber    HPI: Bobby [name] is a 62 y.o. male with history of HTN, OSA, back pain with numbness LUE and left arm due to scoliosis due to DDD. He was admitted on 10/18/17 for two part anterolateral lumbar fusion from L5-S1 by Dr. Vertell Limber. Post op with lethargy, confusion and difficulty following simple commands to complete ADLS. Has had incontinence of bowel and bladder per reports. CIR recommended due to functional decline.   He was independent and working full time PTA. Wife works nights.    Review of Systems  Constitutional: Negative for chills and fever.  HENT: Negative for hearing loss and tinnitus.   Eyes: Negative for blurred vision and double vision.  Respiratory: Negative for cough and sputum production.   Cardiovascular: Negative for chest pain and palpitations.  Gastrointestinal: Positive for nausea. Negative for abdominal pain and heartburn.       Hiccups  Musculoskeletal: Positive for back pain, myalgias and neck pain. Negative for joint pain.  Skin: Negative for itching and rash.  Neurological: Positive for sensory change (bilateral feet) and focal weakness (left side). Negative for dizziness and headaches.  Psychiatric/Behavioral: Negative for memory loss. The patient is not nervous/anxious.       Past Medical History:  Diagnosis Date  . Elevated liver enzymes   . GERD (gastroesophageal reflux disease)   . Heart murmur    as a child only; outgrew it  . History of kidney stones    last attack was in 2016  . Hypercholesterolemia   . Hypertension   . Osteoarthritis    spine  . Radiculopathy   . Scoliosis   . Sleep apnea    does not wear CPAP since weight loss  . Vitamin D deficiency   . Wears glasses     Past Surgical History:  Procedure Laterality Date  . ABDOMINAL EXPOSURE N/A 10/18/2017   Procedure: ABDOMINAL EXPOSURE;  Surgeon:  Rosetta Posner, MD;  Location: [facility];  Service: Vascular;  Laterality: N/A;  Part-1  . ANTERIOR FUSION CERVICAL SPINE  2002   . ANTERIOR LATERAL LUMBAR FUSION 4 LEVELS Left 10/18/2017   Procedure: Left Lumbar One-Two,Lumbar Two-Three,Lumbar Three-Four Anterolateral lumbar interbody fusion;  Surgeon: Erline Levine, MD;  Location: [facility];  Service: Neurosurgery;  Laterality: Left;  anterolateral approach Part-2  . ANTERIOR LUMBAR FUSION N/A 10/18/2017   Procedure: Lumbar Five-Sacral One Anterior lumbar interbody fusion with Dr. Curt Jews;  Surgeon: Erline Levine, MD;  Location: [facility];  Service: Neurosurgery;  Laterality: N/A;  Part-1 Abdominal approach  . APPLICATION OF INTRAOPERATIVE CT SCAN N/A 10/19/2017   Procedure: APPLICATION OF INTRAOPERATIVE CT SCAN;  Surgeon: Erline Levine, MD;  Location: [facility];  Service: Neurosurgery;  Laterality: N/A;  APPLICATION OF INTRAOPERATIVE CT SCAN  . BACK SURGERY    . COLONOSCOPY W/ BIOPSIES AND POLYPECTOMY    . LUMBAR LAMINECTOMY/DECOMPRESSION MICRODISCECTOMY Left 02/19/2016   Procedure: Laminectomy and Foraminotomy -Left Lumbar three-four, Lumbar four-five, Lumbar five-Sacral one ;  Surgeon: Ashok Pall, MD;  Location: [facility];  Service: Neurosurgery;  Laterality: Left;  . POSTERIOR LUMBAR FUSION 4 LEVEL N/A 10/19/2017   Procedure: Thoracic Eight to Pelvis fixation and pedicle screws  Transforaminal Lumbar Interbody Fusion Lumbar Four-Five with possible osteotomy;  Surgeon: Erline Levine, MD;  Location: [facility];  Service: Neurosurgery;  Laterality: N/A;  Thoracic Eight to Pelvis fixation and pedicle screws  Transforaminal Lumbar Interbody Fusion Lumbar Four-Five with possible osteotomy     Family History  Problem Relation Age of Onset  . Hypertension Mother   . Kidney disease Mother   . Hyperlipidemia Mother   . Colon cancer Father   . Pancreatitis Unknown   . Diabetes Other     Social History:  Married. Works as Quarry manager. He reports that he quit smoking  about 10 years ago. His smoking use included cigarettes and cigars. He has a 15.00 pack-year smoking history. he has never used smokeless tobacco. He reports that he drinks about 0.6 oz of alcohol per week. He reports that he does not use drugs.    Allergies: No Known Allergies    Medications Prior to Admission  Medication Sig Dispense Refill  . amLODipine (NORVASC) 2.5 MG tablet Take 1 tablet (2.5 mg total) by mouth daily. 90 tablet 3  . aspirin 81 MG tablet Take 81 mg by mouth daily.     Marland Kitchen atorvastatin (LIPITOR) 20 MG tablet TAKE 1 TABLET BY MOUTH  DAILY AT 6 PM. (Patient taking differently: TAKE 1 TABLET BY MOUTH  DAILY) 90 tablet 2  . hydrochlorothiazide (HYDRODIURIL) 12.5 MG tablet Take 1 tablet (12.5 mg total) by mouth daily. 90 tablet 3  . naproxen sodium (ALEVE) 220 MG tablet Take 220 mg by mouth daily as needed (pain).    Marland Kitchen tiZANidine (ZANAFLEX) 2 MG tablet TAKE 1 TABLET BY MOUTH EVERY 8 HOURS AS NEEDED FOR MUSCLE SPASMS  0  . traMADol (ULTRAM) 50 MG tablet Take 1 tablet (50 mg total) by mouth every 6 (six) hours as needed for moderate pain or severe pain. 30 tablet 0  . tiZANidine (ZANAFLEX) 4 MG tablet Take 1 tablet (4 mg total) by mouth every 6 (six) hours as needed for muscle spasms. (Patient not taking: Reported on 10/06/2017) 60 tablet 0    Home: Home Living Family/patient expects to be discharged to:: Private residence Living Arrangements: Spouse/significant other Available Help at Discharge: Family, Available 24 hours/day Type of Home: House Home Access: Stairs to enter Technical brewer of Steps: 3 Home Layout: One level Bathroom Shower/Tub: Tub/shower unit, Architectural technologist: Standard Home Equipment: None  Functional History: Prior Function Level of Independence: Independent Functional Status:  Mobility: Bed Mobility Overal bed mobility: Needs Assistance Bed Mobility: Rolling, Sidelying to Sit, Sit to Sidelying Rolling: Min assist Sidelying to sit:  Min assist Sit to supine: Max assist Sit to sidelying: Min assist General bed mobility comments: Max cues for sequencing log roll technique. HOB flat with use of bed rail.  Transfers Overall transfer level: Needs assistance Equipment used: Rolling walker (2 wheeled) Transfers: Sit to/from Stand Sit to Stand: Min assist Stand pivot transfers: Min assist, +2 physical assistance General transfer comment: Min assist to boost up from EOB. Cues for hand placement and sequencing. Pt able to side step at EOB wtih mod assist to reposition in bed. Ambulation/Gait Ambulation/Gait assistance: Mod assist, +2 physical assistance Ambulation Distance (Feet): 8 Feet Assistive device: Rolling walker (2 wheeled) Gait Pattern/deviations: Step-to pattern, Wide base of support, Trunk flexed General Gait Details: pt with poor positional awareness, max cues for posture and position in RW, chair to follow closely as pt with 2 periods of partial buckling. When pointing out to pt he was bucklign he said "no, that's fake buckling" but was unable to control it. Assist for balance, RW use and direction and safety.  Gait velocity interpretation: Below normal speed for age/gender  ADL: ADL Overall ADL's : Needs assistance/impaired Eating/Feeding: Minimal assistance, Sitting Grooming: Minimal assistance, Sitting Upper Body Bathing: Moderate assistance, Sitting Lower Body Bathing: Maximal assistance, Sit to/from stand Upper Body Dressing : Maximal assistance, Sitting Upper Body Dressing Details (indicate cue type and reason): to don/doff brace and gown with max cues to maintain upright sitting posture Lower Body Dressing: Total assistance, Bed level Lower Body Dressing Details (indicate cue type and reason): to don socks Toilet Transfer: Moderate assistance, +2 for physical assistance, Stand-pivot Toilet Transfer Details (indicate cue type and reason): Simulated by transfer EOB to chair Functional mobility during  ADLs: Moderate assistance, +2 for physical assistance(for stand pivot only) General ADL Comments: Min assist for sit to stand with mod assist for side stepping toward [facility] for repositioning. No knee buckling noted today. Max cues for hand placement, technique, sequencing, and safety.  Cognition: Cognition Overall Cognitive Status: Impaired/Different from baseline Orientation Level: Disoriented to time Cognition Arousal/Alertness: Awake/alert Behavior During Therapy: WFL for tasks assessed/performed Overall Cognitive Status: Impaired/Different from baseline Area of Impairment: Following commands, Safety/judgement, Awareness, Problem solving Orientation Level: Disoriented to, Time Current Attention Level: Sustained Memory: Decreased recall of precautions, Decreased short-term memory Following Commands: Follows one step commands inconsistently Safety/Judgement: Decreased awareness of safety, Decreased awareness of deficits Awareness: Intellectual Problem Solving: Decreased initiation, Difficulty sequencing, Requires verbal cues, Requires tactile cues General Comments: Requires slow and direct one step cues for participation in tasks. Able to recall 3/3 precautions by end of session.    Blood pressure (!) 148/87, pulse 70, temperature 98.5 F (36.9 C), temperature source Oral, resp. rate (!) 26, height 5\' 3"  (1.6 m), weight 85.9 kg (189 lb 6 oz), SpO2 98 %. Physical Exam  Nursing note and vitals reviewed. Constitutional: He is oriented to person, place, and time. He appears well-developed and well-nourished. No distress.  Persistent hiccups  HENT:  Head: Normocephalic and atraumatic.  Eyes: Conjunctivae and EOM are normal. Pupils are equal, round, and reactive to light.  Neck: Normal range of motion. Neck supple.  Respiratory: Effort normal and breath sounds normal. No stridor.  GI: Soft. Bowel sounds are normal. He exhibits distension. There is no tenderness.  Musculoskeletal: He  exhibits no edema or tenderness.  Neurological: He is alert and oriented to person, place, and time.  Speech clear. Able to follow simple one and two step commands.  Relatively fair insight and awareness.  Some problems and delays with processing.  Upper extremity motor grossly 4+ to 5 out of 5.  Lower extremity 3 out of 5 hip flexion and knee extension to 4 out of 5 bilateral ankle dorsiflexion and plantar flexion.  Sensory exam appears grossly intact to pain and light touch in both limbs  Skin: Skin is warm and dry. He is not diaphoretic. No erythema.  Psychiatric: He has a normal mood and affect. His behavior is normal. Thought content normal.    No results found for this or any previous visit (from the past 24 hour(s)). No results found.  Assessment/Plan: 1. Diagnosis: Debility and encephalopathy after 2 part lumbar fusion for lumbar scoliosis and radiculopathy. 2. Does the need for close, 24 hr/day medical supervision in concert with the patient's rehab needs make it unreasonable for this patient to be served in a less intensive setting? Yes 3. Co-Morbidities requiring supervision/potential complications: Pain management, hypertension, wound care 4. Due to bladder management, bowel management, safety, skin/wound care, disease management, medication administration, pain management and patient education, does the patient require 24 hr/day rehab  nursing? Yes 5. Does the patient require coordinated care of a physician, rehab nurse, PT (1-2 hrs/day, 5 days/week) and OT (1-2 hrs/day, 5 days/week) to address physical and functional deficits in the context of the above medical diagnosis(es)? Yes Addressing deficits in the following areas: balance, endurance, locomotion, strength, transferring, bowel/bladder control, bathing, dressing, feeding, grooming, toileting, cognition and psychosocial support 6. Can the patient actively participate in an intensive therapy program of at least 3 hrs of therapy per  day at least 5 days per week? Yes 7. The potential for patient to make measurable gains while on inpatient rehab is excellent 8. Anticipated functional outcomes upon discharge from inpatient rehab are modified independent  with PT, modified independent with OT, n/a with SLP. 9. Estimated rehab length of stay to reach the above functional goals is: 10-12 days 10. Anticipated D/C setting: Home 11. Anticipated post D/C treatments: HH therapy and Outpatient therapy 12. Overall Rehab/Functional Prognosis: excellent  RECOMMENDATIONS: This patient's condition is appropriate for continued rehabilitative care in the following setting: CIR Patient has agreed to participate in recommended program. Yes Note that insurance prior authorization may be required for reimbursement for recommended care.  Comment: Patient showing some cognitive improvement.  Nevertheless he needs to be modified independent when he returns home as his wife works. Rehab Admissions Coordinator to follow up.  Thanks,  Meredith Staggers, MD, Mellody Drown    Bary Leriche, PA-C 10/21/2017

## 2017-10-21 NOTE — Progress Notes (Signed)
PT assisted pt to chair. Pt was incontinent of urine before transfer to chair. Once in chair pt incontinent of stool. Dr. Vertell Limber paged and will continue to monitor at this time. Pt states" I have full sensation all over".

## 2017-10-21 NOTE — Progress Notes (Signed)
Physical Therapy Treatment Patient Details Name: Bobby [name] MRN: [id number] DOB: February 01, 1956 Today's Date: 10/21/2017    History of Present Illness Pt is a 62 y.o. male s/p L5-S1 ALIF, L1-5 XLIF, T8-pelvis TLIF. PMHx: HTN.    PT Comments    Pt is incontinent of urine upon arrival and does not have any idea how long he had been lying in a wet bed. Pt also incontinent of stool at end of PT session while sitting up in chair. Pt reports that PTA he had no issues with incontinence. Pt is unable to recall year, answering "80s, 90s... No, 2000. 2001" reporting his final answer to be 2002. When informed that it is 2019, pt seemed surprised. 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 Pt tolerated 8-ft of ambulation with modA+2 and RW. During ambulation, pt's knees intermittently gave way. When asked about his knees giving out, pt reports "oh, that was fake.", however just a few seconds later, pt's knees buckled again. Pt is overall confused. Pt's left dorsiflexors and hip flexors both scored 3/5 on MMT. Recommendation for follow-up has been updated. PT will follow acutely in order to promote safe mobility within hospital setting and increase level of independence with activity.   Follow Up Recommendations  CIR;Supervision/Assistance - 24 hour     Equipment Recommendations  Rolling walker with 5" wheels    Recommendations for Other Services       Precautions / Restrictions Precautions Precautions: Back;Fall Precaution Booklet Issued: No Precaution Comments: Educated pt on back precautions Required Braces or Orthoses: Spinal Brace Spinal Brace: Lumbar corset;Applied in sitting position Restrictions Weight Bearing Restrictions: No    Mobility  Bed Mobility Overal bed mobility: Needs Assistance Bed Mobility: Rolling;Sidelying to Sit;Sit to Supine Rolling: Min assist Sidelying to sit: Mod assist   Sit to supine: Max assist   General bed mobility comments: max cues for initiation  sequencing, use of rail and safety to roll, assist to elevate trunk and move legs off of bed. REturn to bed with max assist to elevate legs  Transfers Overall transfer level: Needs assistance   Transfers: Sit to/from Stand;Stand Pivot Transfers Sit to Stand: +2 safety/equipment;+2 physical assistance;Mod assist Stand pivot transfers: Min assist;+2 physical assistance       General transfer comment: Max cues for sequencing and technique, and hand placement. Pt very delayed response to commands. Assist to rise with knee blocked from elevated bed as well as chair with rails. Assist to direct and move RW for pivot  Ambulation/Gait Ambulation/Gait assistance: Mod assist;+2 physical assistance Ambulation Distance (Feet): 8 Feet Assistive device: Rolling walker (2 wheeled) Gait Pattern/deviations: Step-to pattern;Wide base of support;Trunk flexed   Gait velocity interpretation: Below normal speed for age/gender General Gait Details: pt with poor positional awareness, max cues for posture and position in RW, chair to follow closely as pt with 2 periods of partial buckling. When pointing out to pt he was bucklign he said "no, that's fake buckling" but was unable to control it. Assist for balance, RW use and direction and safety.    Stairs            Wheelchair Mobility    Modified Rankin (Stroke Patients Only)       Balance Overall balance assessment: Needs assistance Sitting-balance support: Feet supported;Bilateral upper extremity supported Sitting balance-Leahy Scale: Poor Sitting balance - Comments: min-mod assist for balance due to posterior lean Postural control: Posterior lean Standing balance support: Bilateral upper extremity supported Standing balance-Leahy Scale: Poor Standing  balance comment: bil UE support required                            Cognition Arousal/Alertness: Awake/alert Behavior During Therapy: Flat affect Overall Cognitive Status:  Impaired/Different from baseline Area of Impairment: Orientation;Memory;Attention;Problem solving;Following commands;Safety/judgement;Awareness                 Orientation Level: Disoriented to;Time Current Attention Level: Sustained Memory: Decreased recall of precautions;Decreased short-term memory Following Commands: Follows one step commands inconsistently;Follows one step commands with increased time Safety/Judgement: Decreased awareness of safety;Decreased awareness of deficits Awareness: Intellectual Problem Solving: Slow processing;Decreased initiation;Difficulty sequencing;Requires verbal cues;Requires tactile cues General Comments: pt unaware of precautions, stating year is 2001, unaware of day, significant posterior lean unable to correct with cues and assist, incontinent of B&B throughout session, very delayed processing and no safety awareness      Exercises      General Comments        Pertinent Vitals/Pain Pain Assessment: 0-10 Pain Score: 7  Pain Location: back, incision Pain Descriptors / Indicators: Aching Pain Intervention(s): Limited activity within patient's tolerance;Monitored during session    Home Living                      Prior Function            PT Goals (current goals can now be found in the care plan section) Progress towards PT goals: Progressing toward goals    Frequency    Min 5X/week      PT Plan Discharge plan needs to be updated    Co-evaluation              AM-PAC PT "6 Clicks" Daily Activity  Outcome Measure  Difficulty turning over in bed (including adjusting bedclothes, sheets and blankets)?: Unable Difficulty moving from lying on back to sitting on the side of the bed? : Unable Difficulty sitting down on and standing up from a chair with arms (e.g., wheelchair, bedside commode, etc,.)?: Unable Help needed moving to and from a bed to chair (including a wheelchair)?: A Lot Help needed walking in  hospital room?: A Lot Help needed climbing 3-5 steps with a railing? : Total 6 Click Score: 8    End of Session Equipment Utilized During Treatment: Gait belt;Back brace Activity Tolerance: Patient tolerated treatment well;Patient limited by fatigue Patient left: in bed;with nursing/sitter in room;with call bell/phone within reach Nurse Communication: Mobility status;Other (comment)(fecal and urine incontinence, cognition impaired) PT Visit Diagnosis: Unsteadiness on feet (R26.81);Other abnormalities of gait and mobility (R26.89) Pain - part of body: (back)     Time: 3557-3220 PT Time Calculation (min) (ACUTE ONLY): 43 min  Charges:  $Therapeutic Activity: 38-52 mins                    G Codes:       Judee Clara, SPT   Judee Clara 10/21/2017, 9:09 AM

## 2017-10-21 NOTE — Progress Notes (Signed)
Subjective: Patient reports doing well  Objective: Vital signs in last 24 hours: Temp:  [98 F (36.7 C)-98.8 F (37.1 C)] 98.5 F (36.9 C) (01/17 0436) Pulse Rate:  [98-110] 106 (01/17 0436) Resp:  [16] 16 (01/16 2017) BP: (103-144)/(64-93) 144/78 (01/17 0436) SpO2:  [91 %-96 %] 94 % (01/17 0436)  Intake/Output from previous day: 01/16 0730 - 01/17 0729 In: 2341.7 [P.O.:240; I.V.:2101.7] Out: 550 [Urine:150; Drains:400] Intake/Output this shift: Total I/O In: 2341.7 [P.O.:240; I.V.:2101.7] Out: 550 [Urine:150; Drains:400]  Physical Exam: Awake, alert, conversant.  Good strength in legs.  Dressing CDI. Drain output decreasing.  Sleep apnea at night.  I have asked patient to bring CPAP device from home.  Lab Results: Recent Labs    10/19/17 1013 10/20/17 0819  WBC  --  10.0  HGB 8.8* 9.4*  HCT 26.0* 27.9*  PLT  --  111*   BMET Recent Labs    10/18/17 1213 10/19/17 1013  NA 138 137  K 3.2* 4.1  GLUCOSE  --  118*    Studies/Results: Dg Lumbar Spine 2-3 Views  Result Date: 10/19/2017 CLINICAL DATA:  Localization and Assistance with Transforaminal Lumbar Four-Five Interbody Placement. EXAM: LUMBAR SPINE - 2-3 VIEW COMPARISON:  10/18/2017 FINDINGS: Initial image shows placement of probes from a posterior approach, the more superior superimposed over the L4 spinous process the more inferior superimposed over the posterior pedicle region of L5. Followup image shows placement of a surgical instrument into the L4-L5 disc interspace. Subsequent image shows placement of a radiolucent disc spacer at the L4-L5 level, in the central to posterior aspect of the disc interspace. Changes from the previous interbody fusions at L1-L2, L2-L3, L3-L4 and L5-S1 are stable. IMPRESSION: Surgical localization imaging for L4-L5 interbody fusion. Electronically Signed   By: Lajean Manes M.D.   On: 10/19/2017 15:37    Assessment/Plan: D/C drain.  Mobilize with PT.  CPAP at night for sleep  apnea.    LOS: 3 days    Peggyann Shoals, MD 10/21/2017, 6:54 AM

## 2017-10-22 MED ORDER — PANTOPRAZOLE SODIUM 40 MG PO TBEC
40.0000 mg | DELAYED_RELEASE_TABLET | Freq: Every day | ORAL | Status: DC
  Administered 2017-10-22: 40 mg via ORAL
  Filled 2017-10-22: qty 1

## 2017-10-22 NOTE — Social Work (Addendum)
CSW has completed FL2 as back up plan to CIR.  CSW following as CIR backup.   Alexander Mt, Rew Work [phone]

## 2017-10-22 NOTE — Progress Notes (Signed)
Physical Therapy Treatment Patient Details Name: Bobby [name] MRN: [id number] DOB: August 17, 1956 Today's Date: 10/22/2017    History of Present Illness Pt is a 62 y.o. male s/p L5-S1 ALIF, L1-5 XLIF, T8-pelvis TLIF. PMHx: HTN.    PT Comments    Pt making steady progress but still requires significant assistance for all mobility. Pt continues to have impaired cognition. Continue to feel that pt needs CIR prior to return home.   Follow Up Recommendations  CIR;Supervision/Assistance - 24 hour     Equipment Recommendations  Rolling walker with 5" wheels    Recommendations for Other Services       Precautions / Restrictions Precautions Precautions: Back;Fall Precaution Booklet Issued: No Required Braces or Orthoses: Spinal Brace Spinal Brace: Lumbar corset;Applied in sitting position Restrictions Weight Bearing Restrictions: No    Mobility  Bed Mobility Overal bed mobility: Needs Assistance Bed Mobility: Rolling;Sidelying to Sit Rolling: Min assist Sidelying to sit: Mod assist       General bed mobility comments: Verbal cues for all steps. Assist to elevate trunk into sitting  Transfers Overall transfer level: Needs assistance Equipment used: Rolling walker (2 wheeled) Transfers: Sit to/from Stand Sit to Stand: Min assist;+2 safety/equipment         General transfer comment: Verbal cues for hand placement. Assist to bring hips up.  Ambulation/Gait Ambulation/Gait assistance: Mod assist;+2 safety/equipment Ambulation Distance (Feet): 30 Feet Assistive device: Rolling walker (2 wheeled) Gait Pattern/deviations: Step-to pattern;Wide base of support;Trunk flexed;Decreased step length - right;Decreased step length - left Gait velocity: decr Gait velocity interpretation: Below normal speed for age/gender General Gait Details: Assist for support and balance as well as guiding walker. Frequent cues for posture and technique. Pt with bilateral knees with slight give but  no overt buckling.   Stairs            Wheelchair Mobility    Modified Rankin (Stroke Patients Only)       Balance Overall balance assessment: Needs assistance Sitting-balance support: Feet supported;Bilateral upper extremity supported Sitting balance-Leahy Scale: Poor Sitting balance - Comments: Min guard to min assist due to posterior lean. Pt could correct when cued but seemed unaware of the lean. Postural control: Posterior lean Standing balance support: Bilateral upper extremity supported Standing balance-Leahy Scale: Poor Standing balance comment: walker and min to mod assist for static standing                            Cognition Arousal/Alertness: Awake/alert Behavior During Therapy: Flat affect Overall Cognitive Status: Impaired/Different from baseline Area of Impairment: Memory;Attention;Problem solving;Following commands;Safety/judgement;Awareness                   Current Attention Level: Sustained Memory: Decreased recall of precautions;Decreased short-term memory Following Commands: Follows one step commands with increased time Safety/Judgement: Decreased awareness of safety;Decreased awareness of deficits Awareness: Intellectual Problem Solving: Slow processing;Decreased initiation;Difficulty sequencing;Requires verbal cues;Requires tactile cues        Exercises      General Comments        Pertinent Vitals/Pain Pain Assessment: Faces Faces Pain Scale: Hurts little more Pain Location: back, incision Pain Descriptors / Indicators: Aching Pain Intervention(s): Limited activity within patient's tolerance;Monitored during session;Repositioned    Home Living                      Prior Function            PT Goals (current  goals can now be found in the care plan section) Progress towards PT goals: Progressing toward goals    Frequency    Min 5X/week      PT Plan Current plan remains appropriate     Co-evaluation              AM-PAC PT "6 Clicks" Daily Activity  Outcome Measure  Difficulty turning over in bed (including adjusting bedclothes, sheets and blankets)?: Unable Difficulty moving from lying on back to sitting on the side of the bed? : Unable Difficulty sitting down on and standing up from a chair with arms (e.g., wheelchair, bedside commode, etc,.)?: Unable Help needed moving to and from a bed to chair (including a wheelchair)?: A Lot Help needed walking in hospital room?: A Lot Help needed climbing 3-5 steps with a railing? : Total 6 Click Score: 8    End of Session Equipment Utilized During Treatment: Gait belt;Back brace Activity Tolerance: Patient tolerated treatment well;Patient limited by fatigue Patient left: with call bell/phone within reach;in chair;with chair alarm set Nurse Communication: Mobility status PT Visit Diagnosis: Unsteadiness on feet (R26.81);Other abnormalities of gait and mobility (R26.89) Pain - part of body: (back)     Time: 3007-6226 PT Time Calculation (min) (ACUTE ONLY): 32 min  Charges:  $Gait Training: 23-37 mins                    G Codes:       [facility] PT [place] 10/22/2017, 3:36 PM

## 2017-10-22 NOTE — Progress Notes (Signed)
Inpatient Rehabilitation  I have initiated insurance authorization for an IP Rehab admission with requested admission on Saturday, 10/23/17.  Plan to follow up with patient later today.  Call if questions.  Carmelia Roller., CCC/SLP Admission Coordinator  [place]  Cell [phone]

## 2017-10-22 NOTE — Progress Notes (Addendum)
Subjective: Patient reports "I'm doing ok. My back (hurts) mostly...some in this (left)leg"  Objective: Vital signs in last 24 hours: Temp:  [98.5 F (36.9 C)-99.9 F (37.7 C)] 99.2 F (37.3 C) (01/18 0413) Pulse Rate:  [94-116] 99 (01/18 0415) BP: (93-153)/(47-76) 110/69 (01/18 0415) SpO2:  [95 %-100 %] 100 % (01/18 0415)  Intake/Output from previous day: 01/17 0701 - 01/18 0700 In: 2365 [P.O.:240; I.V.:2100; IV Piggyback:25] Out: 1683 [FGBMS:1115] Intake/Output this shift: No intake/output data recorded.  Awakens to voice. CPAP in use. Pt reports expected back pain and some left thigh pain. Strength is good BLE to resistance supine. Incisions back, left side, and left abdomen without erythema, swelling, or drainage beneath honeycomb and Dermabond drsgs. Belly tight, but pt denies discomfort, noting BM yesterday. (One episode of incontinence upon chair transfer - no issues since.) Scrotum with some bruising - not unexpected - pt unaware and noting no issues.     Lab Results: Recent Labs    10/19/17 1013 10/20/17 0819  WBC  --  10.0  HGB 8.8* 9.4*  HCT 26.0* 27.9*  PLT  --  111*   BMET Recent Labs    10/19/17 1013  NA 137  K 4.1  GLUCOSE 118*    Studies/Results: No results found.  Assessment/Plan: Improving slowly, requires max assist with ambulation  LOS: 4 days  Hopeful of CIR.   Verdis Prime 10/22/2017, 7:19 AM  Patient will likely benefit from Rehab stay.  OK to transfer to Rehab when bed is available.

## 2017-10-22 NOTE — Progress Notes (Signed)
Pt refused CPAP for the night. RT will continue to monitor.   

## 2017-10-22 NOTE — NC FL2 (Signed)
[place] MEDICAID FL2 LEVEL OF CARE SCREENING TOOL     IDENTIFICATION  Patient Name: Bobby [name] Birthdate: 1956/02/11 Sex: male Admission Date (Current Location): 10/18/2017  [facility] and [place] Number:  Herbalist and Address:  [facility], [place] [address] [place], [place] [address]      Provider Number: [id number]  Attending Physician Name and Address:  Erline Levine, MD  Relative Name and Phone Number:   Jerell Demery [phone]    Current Level of Care: Hospital Recommended Level of Care: [place] Prior Approval Number:    Date Approved/Denied:   PASRR Number:   [id number]  Discharge Plan: SNF    Current Diagnoses: Patient Active Problem List   Diagnosis Date Noted  . Scoliosis of thoracolumbar spine 10/18/2017  . Hypertension, essential 08/30/2017  . Neck pain 01/08/2017  . Osteoarthritis of spine with radiculopathy, lumbar region 02/19/2016  . Foot drop, left 11/29/2015  . Encounter for general adult medical examination with abnormal findings 11/29/2015    Orientation RESPIRATION BLADDER Height & Weight     Self, Situation, Place(disoriented to year)  Normal Incontinent Weight: 216 lb 0.8 oz (98 kg) Height:  5\' 3"  (160 cm)  BEHAVIORAL SYMPTOMS/MOOD NEUROLOGICAL BOWEL NUTRITION STATUS      Incontinent Diet  AMBULATORY STATUS COMMUNICATION OF NEEDS Skin   Extensive Assist Verbally Surgical wounds(honeycomb dressings on back; abdomen; flank)                       Personal Care Assistance Level of Assistance  Bathing, Feeding, Dressing Bathing Assistance: Maximum assistance Feeding assistance: Independent Dressing Assistance: Maximum assistance     Functional Limitations Info  Sight, Hearing, Speech Sight Info: Impaired Hearing Info: Adequate Speech Info: Adequate    SPECIAL CARE FACTORS FREQUENCY  PT (By licensed PT), OT (By licensed OT)     PT Frequency: 5x week OT Frequency: 5x  week            Contractures Contractures Info: Not present    Additional Factors Info  Code Status, Allergies Code Status Info: Full Code Allergies Info: No Known Allergies           Current Medications (10/22/2017):  This is the current hospital active medication list Current Facility-Administered Medications  Medication Dose Route Frequency Provider Last Rate Last Dose  . 0.9 %  sodium chloride infusion   Intravenous Once Scheryl Darter, CRNA      . 0.9 %  sodium chloride infusion  250 mL Intravenous Continuous Erline Levine, MD      . acetaminophen (TYLENOL) tablet 650 mg  650 mg Oral Q4H PRN Erline Levine, MD       Or  . acetaminophen (TYLENOL) suppository 650 mg  650 mg Rectal Q4H PRN Erline Levine, MD      . alum & mag hydroxide-simeth (MAALOX/MYLANTA) 200-200-20 MG/5ML suspension 30 mL  30 mL Oral Q6H PRN Erline Levine, MD      . amLODipine (NORVASC) tablet 2.5 mg  2.5 mg Oral Daily Erline Levine, MD   2.5 mg at 10/22/17 0944  . aspirin chewable tablet 81 mg  81 mg Oral Daily Erline Levine, MD   81 mg at 10/22/17 0943  . atorvastatin (LIPITOR) tablet 20 mg  20 mg Oral Daily Erline Levine, MD   20 mg at 10/22/17 0943  . bisacodyl (DULCOLAX) suppository 10 mg  10 mg Rectal Daily Erline Levine, MD      .  bisacodyl (DULCOLAX) suppository 10 mg  10 mg Rectal Daily PRN Erline Levine, MD      . chlorproMAZINE (THORAZINE) 25 mg in sodium chloride 0.9 % 25 mL IVPB  25 mg Intravenous Q6H PRN Erline Levine, MD   Stopped at 10/21/17 1649  . dextrose 5 % and 0.45 % NaCl with KCl 20 mEq/L infusion   Intravenous Continuous Erline Levine, MD 100 mL/hr at 10/21/17 1840    . docusate sodium (COLACE) capsule 100 mg  100 mg Oral BID Erline Levine, MD   100 mg at 10/22/17 0944  . gabapentin (NEURONTIN) capsule 300 mg  300 mg Oral TID Erline Levine, MD   300 mg at 10/22/17 0943  . hydrochlorothiazide (HYDRODIURIL) tablet 12.5 mg  12.5 mg Oral Daily Erline Levine, MD   12.5 mg at 10/22/17 0943  .  menthol-cetylpyridinium (CEPACOL) lozenge 3 mg  1 lozenge Oral PRN Erline Levine, MD       Or  . phenol [facility]) mouth spray 1 spray  1 spray Mouth/Throat PRN Erline Levine, MD      . methocarbamol (ROBAXIN) tablet 500 mg  500 mg Oral Q6H PRN Erline Levine, MD   500 mg at 10/21/17 2124   Or  . methocarbamol (ROBAXIN) 500 mg in dextrose 5 % 50 mL IVPB  500 mg Intravenous Q6H PRN Erline Levine, MD      . morphine 4 MG/ML injection 4 mg  4 mg Intravenous Q2H PRN Erline Levine, MD   4 mg at 10/20/17 2026  . ondansetron (ZOFRAN) tablet 4 mg  4 mg Oral Q6H PRN Erline Levine, MD   4 mg at 10/19/17 0043   Or  . ondansetron [facility]) injection 4 mg  4 mg Intravenous Q6H PRN Erline Levine, MD      . oxyCODONE (Oxy IR/ROXICODONE) immediate release tablet 10 mg  10 mg Oral Q3H PRN Erline Levine, MD   10 mg at 10/22/17 0942  . oxyCODONE (Oxy IR/ROXICODONE) immediate release tablet 5 mg  5 mg Oral Q3H PRN Erline Levine, MD      . pantoprazole (PROTONIX) EC tablet 40 mg  40 mg Oral QHS Erline Levine, MD      . phenol [facility]) mouth spray 1 spray  1 spray Mouth/Throat PRN Erline Levine, MD      . polyethylene glycol (MIRALAX / GLYCOLAX) packet 17 g  17 g Oral Daily PRN Erline Levine, MD      . sodium chloride flush (NS) 0.9 % injection 3 mL  3 mL Intravenous Q12H Erline Levine, MD   3 mL at 10/21/17 2125  . sodium chloride flush (NS) 0.9 % injection 3 mL  3 mL Intravenous PRN Erline Levine, MD      . sodium phosphate (FLEET) 7-19 GM/118ML enema 1 enema  1 enema Rectal Once PRN Erline Levine, MD      . tiZANidine (ZANAFLEX) tablet 4 mg  4 mg Oral Q6H PRN Erline Levine, MD      . zolpidem [facility]) tablet 5 mg  5 mg Oral QHS PRN,MR X 1 Erline Levine, MD   5 mg at 10/20/17 2241     Discharge Medications: Please see discharge summary for a list of discharge medications.  Relevant Imaging Results:  Relevant Lab Results:   Additional Information SS# [place], [place]

## 2017-10-22 NOTE — Progress Notes (Signed)
Patient has home CPAP unit in room. States the machine needs to be cleaned and his wife will here in the morning to clean it.  Patient refused hospital CPAP equipment at this time.

## 2017-10-22 NOTE — Progress Notes (Signed)
Inpatient Rehabilitation  Met with patient at bedside to discuss team's recommendation for IP Rehab.  Shared booklets, insurance verification letter, and answered questions.  Patient eager to regain his independence.  I have received insurance authorization from [facility] for an IP Rehab admission and have prepared paperwork for admission tomorrow 1/19 pending medical clearance by Dr. Vertell Limber and Dr. Naaman Plummer.  Call [phone] if questions.    Carmelia Roller., CCC/SLP Admission Coordinator  [place]  Cell [phone]

## 2017-10-22 NOTE — Care Management Note (Signed)
Case Management Note Previous note Per: Bobby [name], BSN, RN 10/20/2017, 11:51 AM  Patient Details  Name: Bobby [name] MRN: [id number] Date of Birth: [birth year]  Subjective/Objective:   Admitted for Transforaminal Lumbar Interbody Fusion            Action/Plan: In to speak with patient, prior to admission patient lived at home with spouse.  Copy in [name].  PCP is Bobby [name].  Denies inability to afford medications or food.  Has never used an [facility] Agency in the past; would like to review list if [facility] Agency is needed.  Patient states him and wife do the cooking. Wife is available to take him to medical appointments.  Home DME: None.  NCM will continue to follow for discharge transition home.  Status of Service:  In process, will continue to follow ____________________  Bobby [name], BSN, RN 10/22/2017: Expected Discharge Date:  10/23/2017               Expected Discharge Plan:  [place]  In-House Referral:  Clinical Social Work  Discharge planning Services  CM Consult  Status of Service:  In process, will continue to follow  Additional Comments: Plan dc to IP  Rehab tomorrow, per CSW arrangements.   Bobby Cardinal, RN 10/22/2017, 3:46 PM

## 2017-10-22 NOTE — Progress Notes (Signed)
Occupational Therapy Treatment Patient Details Name: Bobby [name] MRN: [id number] DOB: 23-Jan-1956 Today's Date: 10/22/2017    History of present illness Pt is a 62 y.o. male s/p L5-S1 ALIF, L1-5 XLIF, T8-pelvis TLIF. PMHx: HTN.   OT comments  Pt able to perform stand pivot today with min assist. Continues to require max assist for brace management. Pt able to recall 3/3 back precautions at start of session. D/c plan remains appropriate. Will continue to follow acutely.   Follow Up Recommendations  CIR;Supervision/Assistance - 24 hour    Equipment Recommendations  3 in 1 bedside commode;Tub/shower bench    Recommendations for Other Services      Precautions / Restrictions Precautions Precautions: Back;Fall Precaution Booklet Issued: No Precaution Comments: Pt able to recall 3/3 back precautions Required Braces or Orthoses: Spinal Brace Spinal Brace: Thoracolumbosacral orthotic;Applied in sitting position Restrictions Weight Bearing Restrictions: No       Mobility Bed Mobility Overal bed mobility: Needs Assistance Bed Mobility: Rolling;Sit to Sidelying Rolling: Min guard      Sit to sidelying: Min assist General bed mobility comments: Light min assist for LEs back to bed. Cues for log roll technique. HOB flat with use of bed rails.  Transfers Overall transfer level: Needs assistance Equipment used: Rolling walker (2 wheeled) Transfers: Sit to/from [facility] Sit to Stand: Min assist Stand pivot transfers: Min assist       General transfer comment: Min assist to boost up from chair with min assist for pivot to bed. Cues for hand placement and technique    Balance Overall balance assessment: Needs assistance Sitting-balance support: Feet supported;No upper extremity supported Sitting balance-Leahy Scale: Fair   Standing balance support: Bilateral upper extremity supported;During functional activity Standing balance-Leahy Scale: Poor                             ADL either performed or assessed with clinical judgement   ADL Overall ADL's : Needs assistance/impaired                 Upper Body Dressing : Maximal assistance;Sitting Upper Body Dressing Details (indicate cue type and reason): for brace and gown     Toilet Transfer: Minimal assistance;Stand-pivot;RW Toilet Transfer Details (indicate cue type and reason): Simulated by transfer chair to bed. Cues for hand placement and technique         Functional mobility during ADLs: Minimal assistance;Rolling walker(for stand pivot only )       Vision       Perception     Praxis      Cognition Arousal/Alertness: Awake/alert Behavior During Therapy: Flat affect Overall Cognitive Status: Impaired/Different from baseline Area of Impairment: Memory;Following commands;Safety/judgement;Problem solving                   Current Attention Level: Sustained Memory: Decreased short-term memory Following Commands: Follows one step commands inconsistently Safety/Judgement: Decreased awareness of safety;Decreased awareness of deficits Awareness: Intellectual Problem Solving: Slow processing;Decreased initiation;Difficulty sequencing;Requires verbal cues;Requires tactile cues          Exercises     Shoulder Instructions       General Comments      Pertinent Vitals/ Pain       Pain Assessment: 0-10 Pain Score: 7  Faces Pain Scale: Hurts little more Pain Location: back Pain Descriptors / Indicators: Aching;Sore Pain Intervention(s): Limited activity within patient's tolerance;Monitored during session;Repositioned;Patient requesting pain meds-RN notified  Home Living  Prior Functioning/Environment              Frequency  Min 2X/week        Progress Toward Goals  OT Goals(current goals can now be found in the care plan section)  Progress towards OT goals:  Progressing toward goals  Acute Rehab OT Goals Patient Stated Goal: get better OT Goal Formulation: With patient  Plan Discharge plan remains appropriate    Co-evaluation                 AM-PAC PT "6 Clicks" Daily Activity     Outcome Measure   Help from another person eating meals?: A Little Help from another person taking care of personal grooming?: A Little Help from another person toileting, which includes using toliet, bedpan, or urinal?: A Little Help from another person bathing (including washing, rinsing, drying)?: A Lot Help from another person to put on and taking off regular upper body clothing?: A Lot Help from another person to put on and taking off regular lower body clothing?: A Lot 6 Click Score: 15    End of Session Equipment Utilized During Treatment: Rolling walker;Back brace  OT Visit Diagnosis: Unsteadiness on feet (R26.81);Other abnormalities of gait and mobility (R26.89);Pain Pain - part of body: (back)   Activity Tolerance Patient tolerated treatment well   Patient Left in bed;with call bell/phone within reach;with bed alarm set   Nurse Communication Mobility status;Patient requests pain meds        Time: 1761-6073 OT Time Calculation (min): 23 min  Charges: OT General Charges $OT Visit: 1 Visit OT Treatments $Self Care/Home Management : 23-37 mins  Laurell Coalson A. Ulice Brilliant, M.S., OTR/L Pager: [phone] 10/22/2017, 4:58 PM

## 2017-10-22 NOTE — PMR Pre-admission (Signed)
PMR Admission Coordinator Pre-Admission Assessment  Patient: Bobby [name] is an 63 y.o., male MRN: [id number] DOB: 03/06/1956 Height: 5\' 3"  (160 cm) Weight: 98 kg (216 lb 0.8 oz)              Insurance Information HMO:     PPO:      PCP:      IPA:      80/20:      OTHER: [employer]  PRIMARY: [employer] Commercial       Policy#: [id number]      Subscriber: Self CM Name: Vevelyn Royals       Phone#: [phone]     Fax#: [phone] Pre-Cert#: [id number]      Employer:  Benefits:  Phone #: [phone]     Name: Verified online at [email or web address] Eff. Date: 10/05/17     Deduct: $500      Out of Pocket Max: $2500      Life Max: N/A CIR: 90%/10%      SNF: 90%/10% (120 day limit) Outpatient: 90%     Co-Pay: 10% (60 visits per PT, OT, SLP) Home Health: 90%      Co-Pay: 10% (100 visits) DME: 90%     Co-Pay: 10% Providers: In-network   SECONDARY: None      Policy#:       Subscriber:  CM Name:       Phone#:      Fax#:  Pre-Cert#:       Employer:  Benefits:  Phone #:      Name:  Eff. Date:      Deduct:       Out of Pocket Max:       Life Max:  CIR:       SNF:  Outpatient:      Co-Pay:  Home Health:       Co-Pay:  DME:      Co-Pay:   Medicaid Application Date:       Case Manager:  Disability Application Date:       Case Worker:   Emergency Contact Information Contact Information    Name Relation Home Work Mobile   Schaff,Suzanne Spouse [phone]       Current Medical History  Patient Admitting Diagnosis: Debility and encephalopathy after 2 part lumbar fusion for lumbar scoliosis and radiculopathy.   History of Present Illness: Bobby [name] a 62 y.o.malewith history of HTN, OSA, back pain with numbness LUE and left arm due to scoliosis due to DDD. He was admitted on 10/18/17 for two part anterolateral lumbar fusion from L5-S1 by Dr. Vertell Limber. Post op with lethargy, confusion and difficulty following simple commands to complete ADLS. He has had incontinence of bowel and bladder per reports. Pain  control improving. CIR recommended due to functional decline.        Past Medical History  Past Medical History:  Diagnosis Date  . Elevated liver enzymes   . GERD (gastroesophageal reflux disease)   . Heart murmur    as a child only; outgrew it  . History of kidney stones    last attack was in 2016  . Hypercholesterolemia   . Hypertension   . Osteoarthritis    spine  . Radiculopathy   . Scoliosis   . Sleep apnea    does not wear CPAP since weight loss  . Vitamin D deficiency   . Wears glasses     Family History  family history includes Colon cancer in his father; Diabetes in his  other; Hyperlipidemia in his mother; Hypertension in his mother; Kidney disease in his mother; Pancreatitis in his unknown relative.  Prior Rehab/Hospitalizations:  Has the patient had major surgery during 100 days prior to admission? No  Current Medications   Current Facility-Administered Medications:  .  0.9 %  sodium chloride infusion, , Intravenous, Once, Scheryl Darter, CRNA .  0.9 %  sodium chloride infusion, 250 mL, Intravenous, Continuous, Erline Levine, MD .  acetaminophen (TYLENOL) tablet 650 mg, 650 mg, Oral, Q4H PRN **OR** acetaminophen (TYLENOL) suppository 650 mg, 650 mg, Rectal, Q4H PRN, Erline Levine, MD .  alum & mag hydroxide-simeth (MAALOX/MYLANTA) 200-200-20 MG/5ML suspension 30 mL, 30 mL, Oral, Q6H PRN, Erline Levine, MD .  [facility]) tablet 2.5 mg, 2.5 mg, Oral, Daily, Erline Levine, MD, 2.5 mg at 10/22/17 0944 .  aspirin chewable tablet 81 mg, 81 mg, Oral, Daily, Erline Levine, MD, 81 mg at 10/22/17 0943 .  atorvastatin (LIPITOR) tablet 20 mg, 20 mg, Oral, Daily, Erline Levine, MD, 20 mg at 10/22/17 0943 .  bisacodyl (DULCOLAX) suppository 10 mg, 10 mg, Rectal, Daily, Erline Levine, MD .  bisacodyl (DULCOLAX) suppository 10 mg, 10 mg, Rectal, Daily PRN, Erline Levine, MD .  chlorproMAZINE (THORAZINE) 25 mg in sodium chloride 0.9 % 25 mL IVPB, 25 mg, Intravenous,  Q6H PRN, Erline Levine, MD, Stopped at 10/21/17 1649 .  dextrose 5 % and 0.45 % NaCl with KCl 20 mEq/L infusion, , Intravenous, Continuous, Erline Levine, MD, Last Rate: 100 mL/hr at 10/21/17 1840 .  docusate sodium (COLACE) capsule 100 mg, 100 mg, Oral, BID, Erline Levine, MD, 100 mg at 10/22/17 0944 .  gabapentin (NEURONTIN) capsule 300 mg, 300 mg, Oral, TID, Erline Levine, MD, 300 mg at 10/22/17 1615 .  hydrochlorothiazide (HYDRODIURIL) tablet 12.5 mg, 12.5 mg, Oral, Daily, Erline Levine, MD, 12.5 mg at 10/22/17 0943 .  menthol-cetylpyridinium (CEPACOL) lozenge 3 mg, 1 lozenge, Oral, PRN **OR** phenol (CHLORASEPTIC) mouth spray 1 spray, 1 spray, Mouth/Throat, PRN, Erline Levine, MD .  methocarbamol (ROBAXIN) tablet 500 mg, 500 mg, Oral, Q6H PRN, 500 mg at 10/21/17 2124 **OR** methocarbamol (ROBAXIN) 500 mg in dextrose 5 % 50 mL IVPB, 500 mg, Intravenous, Q6H PRN, Erline Levine, MD .  morphine 4 MG/ML injection 4 mg, 4 mg, Intravenous, Q2H PRN, Erline Levine, MD, 4 mg at 10/20/17 2026 .  ondansetron (ZOFRAN) tablet 4 mg, 4 mg, Oral, Q6H PRN, 4 mg at 10/19/17 0043 **OR** ondansetron (ZOFRAN) injection 4 mg, 4 mg, Intravenous, Q6H PRN, Erline Levine, MD .  oxyCODONE (Oxy IR/ROXICODONE) immediate release tablet 10 mg, 10 mg, Oral, Q3H PRN, Erline Levine, MD, 10 mg at 10/22/17 1615 .  oxyCODONE (Oxy IR/ROXICODONE) immediate release tablet 5 mg, 5 mg, Oral, Q3H PRN, Erline Levine, MD .  pantoprazole (PROTONIX) EC tablet 40 mg, 40 mg, Oral, QHS, Erline Levine, MD .  [DISCONTINUED] menthol-cetylpyridinium (CEPACOL) lozenge 3 mg, 1 lozenge, Oral, PRN **OR** phenol (CHLORASEPTIC) mouth spray 1 spray, 1 spray, Mouth/Throat, PRN, Erline Levine, MD .  polyethylene glycol (MIRALAX / GLYCOLAX) packet 17 g, 17 g, Oral, Daily PRN, Erline Levine, MD .  sodium chloride flush (NS) 0.9 % injection 3 mL, 3 mL, Intravenous, Q12H, Erline Levine, MD, 3 mL at 10/21/17 2125 .  sodium chloride flush (NS) 0.9 % injection 3 mL,  3 mL, Intravenous, PRN, Erline Levine, MD .  sodium phosphate (FLEET) 7-19 GM/118ML enema 1 enema, 1 enema, Rectal, Once PRN, Erline Levine, MD .  tiZANidine (ZANAFLEX) tablet 4 mg,  4 mg, Oral, Q6H PRN, Erline Levine, MD .  zolpidem [facility]) tablet 5 mg, 5 mg, Oral, QHS PRN,MR X 1, Erline Levine, MD, 5 mg at 10/20/17 2241  Patients Current Diet: Diet regular Room service appropriate? Yes; Fluid consistency: Thin  Precautions / Restrictions Precautions Precautions: Back, Fall Precaution Booklet Issued: No Precaution Comments: Pt able to recall 3/3 back precautions Spinal Brace: Thoracolumbosacral orthotic, Applied in sitting position Restrictions Weight Bearing Restrictions: No   Has the patient had 2 or more falls or a fall with injury in the past year?No  Prior Activity Level Community (5-7x/wk): Prior to admission patient was working working full time for [employer] and fully independent.    Home Assistive Devices / Equipment Home Assistive Devices/Equipment: Eyeglasses Home Equipment: None  Prior Device Use: Indicate devices/aids used by the patient prior to current illness, exacerbation or injury? None of the above  Prior Functional Level Prior Function Level of Independence: Independent  Self Care: Did the patient need help bathing, dressing, using the toilet or eating  Independent  Indoor Mobility: Did the patient need assistance with walking from room to room (with or without device)? Independent  Stairs: Did the patient need assistance with internal or external stairs (with or without device)? Independent  Functional Cognition: Did the patient need help planning regular tasks such as shopping or remembering to take medications? Independent  Current Functional Level Cognition  Overall Cognitive Status: Impaired/Different from baseline Current Attention Level: Sustained Orientation Level: Disoriented to time Following Commands: Follows one step commands  inconsistently Safety/Judgement: Decreased awareness of safety, Decreased awareness of deficits General Comments: Requires slow and direct one step cues for participation in tasks. Able to recall 3/3 precautions by end of session.      Extremity Assessment (includes Sensation/Coordination)  Upper Extremity Assessment: Generalized weakness  Lower Extremity Assessment: Generalized weakness    ADLs  Overall ADL's : Needs assistance/impaired Eating/Feeding: Minimal assistance, Sitting Grooming: Minimal assistance, Sitting Upper Body Bathing: Moderate assistance, Sitting Lower Body Bathing: Maximal assistance, Sit to/from stand Upper Body Dressing : Maximal assistance, Sitting Upper Body Dressing Details (indicate cue type and reason): for brace and gown Lower Body Dressing: Total assistance, Bed level Lower Body Dressing Details (indicate cue type and reason): to don socks Toilet Transfer: Minimal assistance, Stand-pivot, RW Toilet Transfer Details (indicate cue type and reason): Simulated by transfer chair to bed. Cues for hand placement and technique Functional mobility during ADLs: Minimal assistance, Rolling walker(for stand pivot only ) General ADL Comments: Min assist for sit to stand with mod assist for side stepping toward [facility] for repositioning. No knee buckling noted today. Max cues for hand placement, technique, sequencing, and safety.    Mobility  Overal bed mobility: Needs Assistance Bed Mobility: Rolling, Sit to Sidelying Rolling: Min guard Sidelying to sit: Mod assist Sit to supine: Max assist Sit to sidelying: Min assist General bed mobility comments: Light min assist for LEs back to bed. Cues for log roll technique. HOB flat with use of bed rails.    Transfers  Overall transfer level: Needs assistance Equipment used: Rolling walker (2 wheeled) Transfers: Sit to/from Stand, [employer] Transfers Sit to Stand: Min assist Stand pivot transfers: Min assist General  transfer comment: Min assist to boost up from chair with min assist for pivot to bed. Cues for hand placement and technique    Ambulation / Gait / Stairs / Wheelchair Mobility  Ambulation/Gait Ambulation/Gait assistance: Mod assist, +2 safety/equipment Ambulation Distance (Feet): 30 Feet Assistive device:  Rolling walker (2 wheeled) Gait Pattern/deviations: Step-to pattern, Wide base of support, Trunk flexed, Decreased step length - right, Decreased step length - left General Gait Details: Assist for support and balance as well as guiding walker. Frequent cues for posture and technique. Pt with bilateral knees with slight give but no overt buckling. Gait velocity: decr Gait velocity interpretation: Below normal speed for age/gender    Posture / Balance Dynamic Sitting Balance Sitting balance - Comments: Min guard to min assist due to posterior lean. Pt could correct when cued but seemed unaware of the lean. Balance Overall balance assessment: Needs assistance Sitting-balance support: Feet supported, No upper extremity supported Sitting balance-Leahy Scale: Fair Sitting balance - Comments: Min guard to min assist due to posterior lean. Pt could correct when cued but seemed unaware of the lean. Postural control: Posterior lean Standing balance support: Bilateral upper extremity supported, During functional activity Standing balance-Leahy Scale: Poor Standing balance comment: walker and min to mod assist for static standing    Special needs/care consideration BiPAP/CPAP: Yes, CPAP CPM: No Continuous Drip IV: No Dialysis: No         Life Vest: No Oxygen: No Special Bed: No Trach Size: No Wound Vac (area): No       Skin: Dry                              Location: Surgical incision - back Bowel mgmt: Incontinent, last BM 10/21/17 Bladder mgmt: Continent most recently  Diabetic mgmt: No     Previous Home Environment Living Arrangements: Spouse/significant other Available Help at  Discharge: Family, Available 24 hours/day Type of Home: House Home Layout: One level Home Access: Stairs to enter Technical brewer of Steps: 3 Bathroom Shower/Tub: Tub/shower unit, Architectural technologist: Standard Home Care Services: No  Discharge Living Setting Plans for Discharge Living Setting: Patient's home, Lives with (comment)(Spouse ) Type of Home at Discharge: House Discharge Home Layout: One level Discharge Home Access: Stairs to enter Entrance Stairs-Rails: Can reach both Entrance Stairs-Number of Steps: 3 Discharge Bathroom Shower/Tub: Tub/shower unit, Curtain Discharge Bathroom Toilet: Standard Discharge Bathroom Accessibility: Yes How Accessible: Accessible via walker Does the patient have any problems obtaining your medications?: No  Social/Family/Support Systems Patient Roles: Spouse Contact Information: Spouse: Urbano Milhouse  Anticipated Caregiver: Mod I goals since wife works nights  Anticipated Ambulance person Information: [phone] Ability/Limitations of Caregiver: she works and patient need to be Mod I  Caregiver Availability: Intermittent Discharge Plan Discussed with Primary Caregiver: (Discussed with patient) Is Caregiver In Agreement with Plan?: (Patient in agreement with plan ) Does Caregiver/Family have Issues with Lodging/Transportation while Pt is in Rehab?: No  Goals/Additional Needs Patient/Family Goal for Rehab: OT/PT: Mod I Expected length of stay: 10-12 days  Cultural Considerations: None Dietary Needs: Regular textures and thin liquids  Equipment Needs: TBD Special Service Needs: None Pt/Family Agrees to Admission and willing to participate: Yes Program Orientation Provided & Reviewed with Pt/Caregiver Including Roles  & Responsibilities: Yes Additional Information Needs: Discussed transition to oral pains meds with IP Rehab admission  Information Needs to be Provided By: Team FYI   Decrease burden of Care through IP rehab  admission: No  Possible need for SNF placement upon discharge: No  Patient Condition: This patient's condition remains as documented in the consult dated 10/22/17 at 12:31pm, in which the Rehabilitation Physician determined and documented that the patient's condition is appropriate for intensive rehabilitative care in an  inpatient rehabilitation facility. Will admit to inpatient rehab tomorrow.  Preadmission Screen Completed By:  Gunnar Fusi, 10/22/2017 7:20 PM ______________________________________________________________________   Discussed status with Dr. Naaman Plummer on 10/22/17 at 25 and received telephone approval for admission tomorrow.  Admission Coordinator:  Gunnar Fusi, time 1700/Date 10/22/17

## 2017-10-22 NOTE — H&P (Signed)
Physical Medicine and Rehabilitation Admission H&P    CC: functional deficits due to lumbar scoliosis   HPI:  Bobby [name] is a 62 y.o. male with history of HTN, OSA, back pain with numbness LUE and left arm due to scoliosis due to DDD. He was admitted on 10/18/17 for two part anterolateral lumbar fusion from L5-S1 by Dr. Vertell Limber. Post op with lethargy, confusion and difficulty following simple commands to complete ADLS. He has had incontinence of bowel and bladder per reports. Pain control improving. CIR recommended due to functional decline.    Review of Systems  Constitutional: Negative for chills and fever.  HENT: Negative for hearing loss and tinnitus.   Eyes: Negative for blurred vision and double vision.  Respiratory: Negative for cough and shortness of breath.   Cardiovascular: Negative for chest pain and palpitations.  Gastrointestinal: Negative for constipation, heartburn and nausea.  Genitourinary: Negative for dysuria and urgency.  Musculoskeletal: Positive for back pain and myalgias.  Neurological: Positive for sensory change and focal weakness. Negative for dizziness and headaches.  Psychiatric/Behavioral: Negative for memory loss. The patient does not have insomnia.       Past Medical History:  Diagnosis Date  . Elevated liver enzymes   . GERD (gastroesophageal reflux disease)   . Heart murmur    as a child only; outgrew it  . History of kidney stones    last attack was in 2016  . Hypercholesterolemia   . Hypertension   . Osteoarthritis    spine  . Radiculopathy   . Scoliosis   . Sleep apnea    does not wear CPAP since weight loss  . Vitamin D deficiency   . Wears glasses     Past Surgical History:  Procedure Laterality Date  . ABDOMINAL EXPOSURE N/A 10/18/2017   Procedure: ABDOMINAL EXPOSURE;  Surgeon: Rosetta Posner, MD;  Location: [facility];  Service: Vascular;  Laterality: N/A;  Part-1  . ANTERIOR FUSION CERVICAL SPINE  2002   . ANTERIOR LATERAL  LUMBAR FUSION 4 LEVELS Left 10/18/2017   Procedure: Left Lumbar One-Two,Lumbar Two-Three,Lumbar Three-Four Anterolateral lumbar interbody fusion;  Surgeon: Erline Levine, MD;  Location: [facility];  Service: Neurosurgery;  Laterality: Left;  anterolateral approach Part-2  . ANTERIOR LUMBAR FUSION N/A 10/18/2017   Procedure: Lumbar Five-Sacral One Anterior lumbar interbody fusion with Dr. Curt Jews;  Surgeon: Erline Levine, MD;  Location: [facility];  Service: Neurosurgery;  Laterality: N/A;  Part-1 Abdominal approach  . APPLICATION OF INTRAOPERATIVE CT SCAN N/A 10/19/2017   Procedure: APPLICATION OF INTRAOPERATIVE CT SCAN;  Surgeon: Erline Levine, MD;  Location: [place] [facility];  Service: Neurosurgery;  Laterality: N/A;  APPLICATION OF INTRAOPERATIVE CT SCAN  . BACK SURGERY    . COLONOSCOPY W/ BIOPSIES AND POLYPECTOMY    . LUMBAR LAMINECTOMY/DECOMPRESSION MICRODISCECTOMY Left 02/19/2016   Procedure: Laminectomy and Foraminotomy -Left Lumbar three-four, Lumbar four-five, Lumbar five-Sacral one ;  Surgeon: Ashok Pall, MD;  Location: [facility];  Service: Neurosurgery;  Laterality: Left;  . POSTERIOR LUMBAR FUSION 4 LEVEL N/A 10/19/2017   Procedure: Thoracic Eight to Pelvis fixation and pedicle screws  Transforaminal Lumbar Interbody Fusion Lumbar Four-Five with possible osteotomy;  Surgeon: Erline Levine, MD;  Location: [facility];  Service: Neurosurgery;  Laterality: N/A;  Thoracic Eight to Pelvis fixation and pedicle screws  Transforaminal Lumbar Interbody Fusion Lumbar Four-Five with possible osteotomy     Family History  Problem Relation Age of Onset  . Hypertension Mother   . Kidney  disease Mother   . Hyperlipidemia Mother   . Colon cancer Father   . Pancreatitis Unknown   . Diabetes Other     Social History:   Married. Works as Quarry manager. He reports that he quit smoking about 10 years ago. His smoking use included cigarettes and cigars. He has a 15.00 pack-year smoking history. he has never used smokeless  tobacco. He reports that he drinks about 0.6 oz of alcohol per week. He reports that he does not use drugs.   Allergies: No Known Allergies    Medications Prior to Admission  Medication Sig Dispense Refill  . amLODipine (NORVASC) 2.5 MG tablet Take 1 tablet (2.5 mg total) by mouth daily. 90 tablet 3  . aspirin 81 MG tablet Take 81 mg by mouth daily.     Marland Kitchen atorvastatin (LIPITOR) 20 MG tablet TAKE 1 TABLET BY MOUTH  DAILY AT 6 PM. (Patient taking differently: TAKE 1 TABLET BY MOUTH  DAILY) 90 tablet 2  . hydrochlorothiazide (HYDRODIURIL) 12.5 MG tablet Take 1 tablet (12.5 mg total) by mouth daily. 90 tablet 3  . naproxen sodium (ALEVE) 220 MG tablet Take 220 mg by mouth daily as needed (pain).    Marland Kitchen tiZANidine (ZANAFLEX) 2 MG tablet TAKE 1 TABLET BY MOUTH EVERY 8 HOURS AS NEEDED FOR MUSCLE SPASMS  0  . traMADol (ULTRAM) 50 MG tablet Take 1 tablet (50 mg total) by mouth every 6 (six) hours as needed for moderate pain or severe pain. 30 tablet 0  . tiZANidine (ZANAFLEX) 4 MG tablet Take 1 tablet (4 mg total) by mouth every 6 (six) hours as needed for muscle spasms. (Patient not taking: Reported on 10/06/2017) 60 tablet 0    Drug Regimen Review  Drug regimen was reviewed and remains appropriate with no significant issues identified  Home: Home Living Family/patient expects to be discharged to:: Private residence Living Arrangements: Spouse/significant other Available Help at Discharge: Family, Available 24 hours/day Type of Home: House Home Access: Stairs to enter Technical brewer of Steps: 3 Home Layout: One level Bathroom Shower/Tub: Tub/shower unit, Architectural technologist: Standard Home Equipment: None   Functional History: Prior Function Level of Independence: Independent  Functional Status:  Mobility: Bed Mobility Overal bed mobility: Needs Assistance Bed Mobility: Rolling, Sidelying to Sit, Sit to Sidelying Rolling: Min assist Sidelying to sit: Min assist Sit to  supine: Max assist Sit to sidelying: Min assist General bed mobility comments: Max cues for sequencing log roll technique. HOB flat with use of bed rail.  Transfers Overall transfer level: Needs assistance Equipment used: Rolling walker (2 wheeled) Transfers: Sit to/from Stand Sit to Stand: Min assist Stand pivot transfers: Min assist, +2 physical assistance General transfer comment: Min assist to boost up from EOB. Cues for hand placement and sequencing. Pt able to side step at EOB wtih mod assist to reposition in bed. Ambulation/Gait Ambulation/Gait assistance: Mod assist, +2 physical assistance Ambulation Distance (Feet): 8 Feet Assistive device: Rolling walker (2 wheeled) Gait Pattern/deviations: Step-to pattern, Wide base of support, Trunk flexed General Gait Details: pt with poor positional awareness, max cues for posture and position in RW, chair to follow closely as pt with 2 periods of partial buckling. When pointing out to pt he was bucklign he said "no, that's fake buckling" but was unable to control it. Assist for balance, RW use and direction and safety.  Gait velocity interpretation: Below normal speed for age/gender    ADL: ADL Overall ADL's : Needs assistance/impaired Eating/Feeding: Minimal assistance,  Sitting Grooming: Minimal assistance, Sitting Upper Body Bathing: Moderate assistance, Sitting Lower Body Bathing: Maximal assistance, Sit to/from stand Upper Body Dressing : Maximal assistance, Sitting Upper Body Dressing Details (indicate cue type and reason): to don/doff brace and gown with max cues to maintain upright sitting posture Lower Body Dressing: Total assistance, Bed level Lower Body Dressing Details (indicate cue type and reason): to don socks Toilet Transfer: Moderate assistance, +2 for physical assistance, Stand-pivot Toilet Transfer Details (indicate cue type and reason): Simulated by transfer EOB to chair Functional mobility during ADLs: Moderate  assistance, +2 for physical assistance(for stand pivot only) General ADL Comments: Min assist for sit to stand with mod assist for side stepping toward [facility] for repositioning. No knee buckling noted today. Max cues for hand placement, technique, sequencing, and safety.  Cognition: Cognition Overall Cognitive Status: Impaired/Different from baseline Orientation Level: Disoriented to time Cognition Arousal/Alertness: Awake/alert Behavior During Therapy: WFL for tasks assessed/performed Overall Cognitive Status: Impaired/Different from baseline Area of Impairment: Following commands, Safety/judgement, Awareness, Problem solving Orientation Level: Disoriented to, Time Current Attention Level: Sustained Memory: Decreased recall of precautions, Decreased short-term memory Following Commands: Follows one step commands inconsistently Safety/Judgement: Decreased awareness of safety, Decreased awareness of deficits Awareness: Intellectual Problem Solving: Decreased initiation, Difficulty sequencing, Requires verbal cues, Requires tactile cues General Comments: Requires slow and direct one step cues for participation in tasks. Able to recall 3/3 precautions by end of session.     Blood pressure 132/67, pulse 96, temperature 98.2 F (36.8 C), temperature source Oral, resp. rate 16, height 5\' 3"  (1.6 m), weight 98 kg (216 lb 0.8 oz), SpO2 96 %. Physical Exam  Nursing note and vitals reviewed. Constitutional: He is oriented to person, place, and time. He appears well-developed and well-nourished. No distress.  Found patient with 3 pills lying on the collar of his gown while lying in bed  HENT:  Head: Normocephalic and atraumatic.  Eyes: Conjunctivae and EOM are normal. Pupils are equal, round, and reactive to light.  Neck: Normal range of motion. Neck supple.  Cardiovascular: Normal rate and regular rhythm. Exam reveals no friction rub.  No murmur heard. Respiratory: Effort normal and breath  sounds normal. No stridor. No respiratory distress. He has no wheezes.  GI: Soft. Bowel sounds are normal. He exhibits no distension. There is no tenderness.  Musculoskeletal: He exhibits no edema or tenderness.  Neurological: He is alert and oriented to person, place, and time.  Patient oriented to self and place as well as reason he is here.  Can tell me the month.  Decreased insight and awareness overall however.  Delays in processing.  Upper extremity strength grossly 4+ out of 5 proximal and distal.  Lower extremity 2 out of 5 hip flexion knee flexion/extension and 4 out of 5 ankle dorsiflexion plantarflexion with pain component in the lower limbs.  No gross sensory findings noted in the limbs and he can sense both light touch and pain.  Skin: Skin is warm and dry. He is not diaphoretic.  Left flank and hip incisions with honeycomb dressing--dry serous drainage noted. Upper mid back with dry dressing.   Psychiatric: He has a normal mood and affect. His behavior is normal. Judgment and thought content normal.    No results found for this or any previous visit (from the past 48 hour(s)). No results found.     Medical Problem List and Plan: 1.  Functional mobility deficits secondary to lumbar scoliosis with radiculopathy status post lumbar decompression and fusion  with postoperative encephalopathy  -admit to inpatient rehab today 2.  DVT Prophylaxis/Anticoagulation: Mechanical: Sequential compression devices, below knee Bilateral lower extremities 3. Pain Management: Oxycodone prn effective. Continue Neurontin for neuropathy.  4. Mood: LCSW to follow for evaluation and support.  5. Neuropsych: This patient is capable of making decisions on his own behalf. 6. Skin/Wound Care: monitor incisions for healing.  7. Fluids/Electrolytes/Nutrition: Monitor I/O. Offer supplements as needed if intake is poor. Check lytes in am.  8. IFO:YDXAJOI BP bid--continue Norvasc and HCTZ daily 9. OSA:  Encourage consistent use of CPAP.  10. ABLA: Monitor H/H serially. Check CBC in am.       Post Admission Physician Evaluation: 1. Functional deficits secondary  to lumbar scoliosis/radiculopathy status post decompression and fusion with encephalopathy postop. 2. Patient is admitted to receive collaborative, interdisciplinary care between the physiatrist, rehab nursing staff, and therapy team. 3. Patient's level of medical complexity and substantial therapy needs in context of that medical necessity cannot be provided at a lesser intensity of care such as a SNF. 4. Patient has experienced substantial functional loss from his/her baseline which was documented above under the "Functional History" and "Functional Status" headings.  Judging by the patient's diagnosis, physical exam, and functional history, the patient has potential for functional progress which will result in measurable gains while on inpatient rehab.  These gains will be of substantial and practical use upon discharge  in facilitating mobility and self-care at the household level. 5. Physiatrist will provide 24 hour management of medical needs as well as oversight of the therapy plan/treatment and provide guidance as appropriate regarding the interaction of the two. 6. The Preadmission Screening has been reviewed and patient status is unchanged unless otherwise stated above. 7. 24 hour rehab nursing will assist with bladder management, bowel management, safety, skin/wound care, disease management, medication administration, pain management and patient education  and help integrate therapy concepts, techniques,education, etc. 8. PT will assess and treat for/with: Lower extremity strength, range of motion, stamina, balance, functional mobility, safety, adaptive techniques and equipment, NMR, postoperative precautions, donning and doffing of back brace.   Goals are: Modified independent. 9. OT will assess and treat for/with: ADL's,  functional mobility, safety, upper extremity strength, adaptive techniques and equipment, neuromuscular reeducation, donning and doffing of back brace, postoperative precautions.   Goals are: Modified independent. Therapy may proceed with showering this patient. 10. SLP will assess and treat for/with: n/a.  Goals are: n/a.  Will follow patient for now but do not see me for speech therapy intervention as encephalopathy seems to be slowly resolving.  We will engage speech therapy if needed. 11. Case Management and Social Worker will assess and treat for psychological issues and discharge planning. 12. Team conference will be held weekly to assess progress toward goals and to determine barriers to discharge. 13. Patient will receive at least 3 hours of therapy per day at least 5 days per week. 14. ELOS: 10-12 days       15. Prognosis:  excellent     Meredith Staggers, MD, [place] [facility] 10/23/2017  Bary Leriche, PA-C 10/22/2017

## 2017-10-23 ENCOUNTER — Other Ambulatory Visit: Payer: Self-pay | Admitting: Family Medicine

## 2017-10-23 ENCOUNTER — Encounter (HOSPITAL_COMMUNITY): Payer: Self-pay

## 2017-10-23 ENCOUNTER — Inpatient Hospital Stay (HOSPITAL_COMMUNITY)
Admission: RE | Admit: 2017-10-23 | Discharge: 2017-10-29 | DRG: 560 | Disposition: A | Discharge status: Home Health | Payer: 59 | Source: Intra-hospital | Attending: Physical Medicine & Rehabilitation | Admitting: Physical Medicine & Rehabilitation

## 2017-10-23 ENCOUNTER — Other Ambulatory Visit: Payer: Self-pay

## 2017-10-23 DIAGNOSIS — K5900 Constipation, unspecified: Secondary | ICD-10-CM | POA: Diagnosis present

## 2017-10-23 DIAGNOSIS — M4156 Other secondary scoliosis, lumbar region: Secondary | ICD-10-CM | POA: Diagnosis not present

## 2017-10-23 DIAGNOSIS — Z8249 Family history of ischemic heart disease and other diseases of the circulatory system: Secondary | ICD-10-CM

## 2017-10-23 DIAGNOSIS — K5903 Drug induced constipation: Secondary | ICD-10-CM | POA: Diagnosis not present

## 2017-10-23 DIAGNOSIS — E46 Unspecified protein-calorie malnutrition: Secondary | ICD-10-CM

## 2017-10-23 DIAGNOSIS — G629 Polyneuropathy, unspecified: Secondary | ICD-10-CM | POA: Diagnosis present

## 2017-10-23 DIAGNOSIS — G4733 Obstructive sleep apnea (adult) (pediatric): Secondary | ICD-10-CM | POA: Diagnosis present

## 2017-10-23 DIAGNOSIS — D62 Acute posthemorrhagic anemia: Secondary | ICD-10-CM | POA: Diagnosis present

## 2017-10-23 DIAGNOSIS — Z7982 Long term (current) use of aspirin: Secondary | ICD-10-CM

## 2017-10-23 DIAGNOSIS — G9340 Encephalopathy, unspecified: Secondary | ICD-10-CM

## 2017-10-23 DIAGNOSIS — Z87442 Personal history of urinary calculi: Secondary | ICD-10-CM

## 2017-10-23 DIAGNOSIS — Z981 Arthrodesis status: Secondary | ICD-10-CM | POA: Diagnosis not present

## 2017-10-23 DIAGNOSIS — G8918 Other acute postprocedural pain: Secondary | ICD-10-CM | POA: Diagnosis not present

## 2017-10-23 DIAGNOSIS — Z79899 Other long term (current) drug therapy: Secondary | ICD-10-CM

## 2017-10-23 DIAGNOSIS — I10 Essential (primary) hypertension: Secondary | ICD-10-CM | POA: Diagnosis present

## 2017-10-23 DIAGNOSIS — Z87891 Personal history of nicotine dependence: Secondary | ICD-10-CM | POA: Diagnosis not present

## 2017-10-23 DIAGNOSIS — E8809 Other disorders of plasma-protein metabolism, not elsewhere classified: Secondary | ICD-10-CM | POA: Diagnosis not present

## 2017-10-23 DIAGNOSIS — E7800 Pure hypercholesterolemia, unspecified: Secondary | ICD-10-CM | POA: Diagnosis present

## 2017-10-23 DIAGNOSIS — N5089 Other specified disorders of the male genital organs: Secondary | ICD-10-CM

## 2017-10-23 DIAGNOSIS — R609 Edema, unspecified: Secondary | ICD-10-CM | POA: Diagnosis not present

## 2017-10-23 DIAGNOSIS — Z7409 Other reduced mobility: Secondary | ICD-10-CM | POA: Diagnosis present

## 2017-10-23 DIAGNOSIS — Z4789 Encounter for other orthopedic aftercare: Principal | ICD-10-CM

## 2017-10-23 MED ORDER — CYCLOBENZAPRINE HCL 5 MG PO TABS
5.0000 mg | ORAL_TABLET | Freq: Three times a day (TID) | ORAL | Status: DC | PRN
  Administered 2017-10-23 – 2017-10-28 (×9): 5 mg via ORAL
  Filled 2017-10-23 (×9): qty 1

## 2017-10-23 MED ORDER — ONDANSETRON HCL 4 MG/2ML IJ SOLN: 4.0000 mg | Freq: Four times a day (QID) | INTRAMUSCULAR | Status: DC | PRN

## 2017-10-23 MED ORDER — BISACODYL 10 MG RE SUPP: 10.0000 mg | Freq: Every day | RECTAL | Status: DC | PRN

## 2017-10-23 MED ORDER — PANTOPRAZOLE SODIUM 40 MG PO TBEC
40.0000 mg | DELAYED_RELEASE_TABLET | Freq: Every day | ORAL | Status: DC
  Administered 2017-10-23 – 2017-10-28 (×6): 40 mg via ORAL
  Filled 2017-10-23 (×6): qty 1

## 2017-10-23 MED ORDER — GUAIFENESIN-DM 100-10 MG/5ML PO SYRP: 5.0000 mL | ORAL_SOLUTION | Freq: Four times a day (QID) | ORAL | Status: DC | PRN

## 2017-10-23 MED ORDER — ONDANSETRON HCL 4 MG PO TABS: 4.0000 mg | ORAL_TABLET | Freq: Four times a day (QID) | ORAL | Status: DC | PRN

## 2017-10-23 MED ORDER — POLYETHYLENE GLYCOL 3350 17 G PO PACK
17.0000 g | PACK | Freq: Every day | ORAL | Status: DC | PRN
  Administered 2017-10-23 – 2017-10-24 (×2): 17 g via ORAL
  Filled 2017-10-23 (×2): qty 1

## 2017-10-23 MED ORDER — TRAZODONE HCL 50 MG PO TABS
25.0000 mg | ORAL_TABLET | Freq: Every evening | ORAL | Status: DC | PRN
  Administered 2017-10-23 – 2017-10-28 (×4): 50 mg via ORAL
  Filled 2017-10-23 (×6): qty 1

## 2017-10-23 MED ORDER — MENTHOL 3 MG MT LOZG: 1.0000 | LOZENGE | OROMUCOSAL | Status: DC | PRN

## 2017-10-23 MED ORDER — CHLORPROMAZINE HCL 25 MG/ML IJ SOLN
25.0000 mg | Freq: Three times a day (TID) | INTRAMUSCULAR | Status: DC | PRN
  Filled 2017-10-23: qty 1

## 2017-10-23 MED ORDER — OXYCODONE HCL 5 MG PO TABS
10.0000 mg | ORAL_TABLET | ORAL | Status: DC | PRN
  Administered 2017-10-23 – 2017-10-26 (×12): 10 mg via ORAL
  Filled 2017-10-23 (×12): qty 2

## 2017-10-23 MED ORDER — SENNOSIDES-DOCUSATE SODIUM 8.6-50 MG PO TABS
2.0000 | ORAL_TABLET | Freq: Every day | ORAL | Status: DC
  Administered 2017-10-23 – 2017-10-28 (×6): 2 via ORAL
  Filled 2017-10-23 (×6): qty 2

## 2017-10-23 MED ORDER — ASPIRIN 81 MG PO CHEW
81.0000 mg | CHEWABLE_TABLET | Freq: Every day | ORAL | Status: DC
  Administered 2017-10-24 – 2017-10-29 (×6): 81 mg via ORAL
  Filled 2017-10-23 (×6): qty 1

## 2017-10-23 MED ORDER — HYDROCHLOROTHIAZIDE 25 MG PO TABS
12.5000 mg | ORAL_TABLET | Freq: Every day | ORAL | Status: DC
  Administered 2017-10-24 – 2017-10-29 (×6): 12.5 mg via ORAL
  Filled 2017-10-23 (×6): qty 1

## 2017-10-23 MED ORDER — OXYCODONE HCL 5 MG PO TABS: 5.0000 mg | ORAL_TABLET | ORAL | Status: DC | PRN

## 2017-10-23 MED ORDER — BACLOFEN 5 MG HALF TABLET: 5.0000 mg | ORAL_TABLET | Freq: Three times a day (TID) | ORAL | Status: DC | PRN

## 2017-10-23 MED ORDER — DIPHENHYDRAMINE HCL 12.5 MG/5ML PO ELIX: 12.5000 mg | ORAL_SOLUTION | Freq: Four times a day (QID) | ORAL | Status: DC | PRN

## 2017-10-23 MED ORDER — AMLODIPINE BESYLATE 2.5 MG PO TABS
2.5000 mg | ORAL_TABLET | Freq: Every day | ORAL | Status: DC
  Administered 2017-10-24 – 2017-10-29 (×6): 2.5 mg via ORAL
  Filled 2017-10-23 (×6): qty 1

## 2017-10-23 MED ORDER — PROCHLORPERAZINE MALEATE 5 MG PO TABS
5.0000 mg | ORAL_TABLET | Freq: Four times a day (QID) | ORAL | Status: DC | PRN
  Administered 2017-10-23: 10 mg via ORAL
  Administered 2017-10-25: 5 mg via ORAL
  Filled 2017-10-23: qty 1
  Filled 2017-10-23: qty 2

## 2017-10-23 MED ORDER — GABAPENTIN 300 MG PO CAPS
300.0000 mg | ORAL_CAPSULE | Freq: Three times a day (TID) | ORAL | Status: DC
  Administered 2017-10-23 – 2017-10-29 (×18): 300 mg via ORAL
  Filled 2017-10-23 (×18): qty 1

## 2017-10-23 MED ORDER — PROCHLORPERAZINE EDISYLATE 5 MG/ML IJ SOLN: 5.0000 mg | Freq: Four times a day (QID) | INTRAMUSCULAR | Status: DC | PRN

## 2017-10-23 MED ORDER — ACETAMINOPHEN 325 MG PO TABS
325.0000 mg | ORAL_TABLET | ORAL | Status: DC | PRN
  Administered 2017-10-27: 650 mg via ORAL
  Filled 2017-10-23: qty 2

## 2017-10-23 MED ORDER — FLEET ENEMA 7-19 GM/118ML RE ENEM: 1.0000 | ENEMA | Freq: Once | RECTAL | Status: DC | PRN

## 2017-10-23 MED ORDER — ALUM & MAG HYDROXIDE-SIMETH 200-200-20 MG/5ML PO SUSP: 30.0000 mL | ORAL | Status: DC | PRN

## 2017-10-23 MED ORDER — ATORVASTATIN CALCIUM 20 MG PO TABS
20.0000 mg | ORAL_TABLET | Freq: Every day | ORAL | Status: DC
  Administered 2017-10-24 – 2017-10-29 (×6): 20 mg via ORAL
  Filled 2017-10-23 (×6): qty 1

## 2017-10-23 MED ORDER — PHENOL 1.4 % MT LIQD: 1.0000 | OROMUCOSAL | Status: DC | PRN

## 2017-10-23 MED ORDER — PROCHLORPERAZINE 25 MG RE SUPP: 12.5000 mg | Freq: Four times a day (QID) | RECTAL | Status: DC | PRN

## 2017-10-23 NOTE — H&P (Signed)
Physical Medicine and Rehabilitation Admission H&P    CC: functional deficits due to lumbar scoliosis   HPI:  Bobby [name] a 62 y.o.malewith history of HTN, OSA, back pain with numbness LUE and left arm due to scoliosis due to DDD. He was admitted on 10/18/17 for two part anterolateral lumbar fusion from L5-S1 by Dr. Vertell Limber. Post op with lethargy, confusion and difficulty following simple commands to complete ADLS. He has had incontinence of bowel and bladder per reports. Pain control improving. CIR recommended due to functional decline.   Review of Systems  Constitutional: Negative for chills and fever.  HENT: Negative for hearing loss and tinnitus.   Eyes: Negative for blurred vision and double vision.  Respiratory: Negative for cough and shortness of breath.   Cardiovascular: Negative for chest pain and palpitations.  Gastrointestinal: Negative for constipation, heartburn and nausea.  Genitourinary: Negative for dysuria and urgency.  Musculoskeletal: Positive for back pain and myalgias.  Neurological: Positive for sensory change and focal weakness. Negative for dizziness and headaches.  Psychiatric/Behavioral: Negative for memory loss. The patient does not have insomnia.           Past Medical History:  Diagnosis Date  . Elevated liver enzymes   . GERD (gastroesophageal reflux disease)   . Heart murmur    as a child only; outgrew it  . History of kidney stones    last attack was in 2016  . Hypercholesterolemia   . Hypertension   . Osteoarthritis    spine  . Radiculopathy   . Scoliosis   . Sleep apnea    does not wear CPAP since weight loss  . Vitamin D deficiency   . Wears glasses          Past Surgical History:  Procedure Laterality Date  . ABDOMINAL EXPOSURE N/A 10/18/2017   Procedure: ABDOMINAL EXPOSURE;  Surgeon: Rosetta Posner, MD;  Location: [facility];  Service: Vascular;  Laterality: N/A;  Part-1  . ANTERIOR FUSION  CERVICAL SPINE  2002   . ANTERIOR LATERAL LUMBAR FUSION 4 LEVELS Left 10/18/2017   Procedure: Left Lumbar One-Two,Lumbar Two-Three,Lumbar Three-Four Anterolateral lumbar interbody fusion;  Surgeon: Erline Levine, MD;  Location: [facility];  Service: Neurosurgery;  Laterality: Left;  anterolateral approach Part-2  . ANTERIOR LUMBAR FUSION N/A 10/18/2017   Procedure: Lumbar Five-Sacral One Anterior lumbar interbody fusion with Dr. Curt Jews;  Surgeon: Erline Levine, MD;  Location: [facility];  Service: Neurosurgery;  Laterality: N/A;  Part-1 Abdominal approach  . APPLICATION OF INTRAOPERATIVE CT SCAN N/A 10/19/2017   Procedure: APPLICATION OF INTRAOPERATIVE CT SCAN;  Surgeon: Erline Levine, MD;  Location: [facility];  Service: Neurosurgery;  Laterality: N/A;  APPLICATION OF INTRAOPERATIVE CT SCAN  . BACK SURGERY    . COLONOSCOPY W/ BIOPSIES AND POLYPECTOMY    . LUMBAR LAMINECTOMY/DECOMPRESSION MICRODISCECTOMY Left 02/19/2016   Procedure: Laminectomy and Foraminotomy -Left Lumbar three-four, Lumbar four-five, Lumbar five-Sacral one ;  Surgeon: Ashok Pall, MD;  Location: [facility];  Service: Neurosurgery;  Laterality: Left;  . POSTERIOR LUMBAR FUSION 4 LEVEL N/A 10/19/2017   Procedure: Thoracic Eight to Pelvis fixation and pedicle screws  Transforaminal Lumbar Interbody Fusion Lumbar Four-Five with possible osteotomy;  Surgeon: Erline Levine, MD;  Location: [facility];  Service: Neurosurgery;  Laterality: N/A;  Thoracic Eight to Pelvis fixation and pedicle screws  Transforaminal Lumbar Interbody Fusion Lumbar Four-Five with possible osteotomy          Family History  Problem Relation Age  of Onset  . Hypertension Mother   . Kidney disease Mother   . Hyperlipidemia Mother   . Colon cancer Father   . Pancreatitis Unknown   . Diabetes Other     Social History:  Married. Works as Quarry manager. Hereports that he quit smoking about 10 years ago. His smoking use included cigarettes and cigars. He has a  15.00 pack-year smoking history. he has never used smokeless tobacco. He reports that he drinks about 0.6 oz of alcohol per week. He reports that he does not use drugs.  Allergies: No Known Allergies          Medications Prior to Admission  Medication Sig Dispense Refill  . amLODipine (NORVASC) 2.5 MG tablet Take 1 tablet (2.5 mg total) by mouth daily. 90 tablet 3  . aspirin 81 MG tablet Take 81 mg by mouth daily.     Marland Kitchen atorvastatin (LIPITOR) 20 MG tablet TAKE 1 TABLET BY MOUTH  DAILY AT 6 PM. (Patient taking differently: TAKE 1 TABLET BY MOUTH  DAILY) 90 tablet 2  . hydrochlorothiazide (HYDRODIURIL) 12.5 MG tablet Take 1 tablet (12.5 mg total) by mouth daily. 90 tablet 3  . naproxen sodium (ALEVE) 220 MG tablet Take 220 mg by mouth daily as needed (pain).    Marland Kitchen tiZANidine (ZANAFLEX) 2 MG tablet TAKE 1 TABLET BY MOUTH EVERY 8 HOURS AS NEEDED FOR MUSCLE SPASMS  0  . traMADol (ULTRAM) 50 MG tablet Take 1 tablet (50 mg total) by mouth every 6 (six) hours as needed for moderate pain or severe pain. 30 tablet 0  . tiZANidine (ZANAFLEX) 4 MG tablet Take 1 tablet (4 mg total) by mouth every 6 (six) hours as needed for muscle spasms. (Patient not taking: Reported on 10/06/2017) 60 tablet 0    Drug Regimen Review  Drug regimen was reviewed and remains appropriate with no significant issues identified  Home: Home Living Family/patient expects to be discharged to:: Private residence Living Arrangements: Spouse/significant other Available Help at Discharge: Family, Available 24 hours/day Type of Home: House Home Access: Stairs to enter Technical brewer of Steps: 3 Home Layout: One level Bathroom Shower/Tub: Tub/shower unit, Architectural technologist: Standard Home Equipment: None   Functional History: Prior Function Level of Independence: Independent  Functional Status:  Mobility: Bed Mobility Overal bed mobility: Needs Assistance Bed Mobility: Rolling, Sidelying to Sit,  Sit to Sidelying Rolling: Min assist Sidelying to sit: Min assist Sit to supine: Max assist Sit to sidelying: Min assist General bed mobility comments: Max cues for sequencing log roll technique. HOB flat with use of bed rail.  Transfers Overall transfer level: Needs assistance Equipment used: Rolling walker (2 wheeled) Transfers: Sit to/from Stand Sit to Stand: Min assist Stand pivot transfers: Min assist, +2 physical assistance General transfer comment: Min assist to boost up from EOB. Cues for hand placement and sequencing. Pt able to side step at EOB wtih mod assist to reposition in bed. Ambulation/Gait Ambulation/Gait assistance: Mod assist, +2 physical assistance Ambulation Distance (Feet): 8 Feet Assistive device: Rolling walker (2 wheeled) Gait Pattern/deviations: Step-to pattern, Wide base of support, Trunk flexed General Gait Details: pt with poor positional awareness, max cues for posture and position in RW, chair to follow closely as pt with 2 periods of partial buckling. When pointing out to pt he was bucklign he said "no, that's fake buckling" but was unable to control it. Assist for balance, RW use and direction and safety.  Gait velocity interpretation: Below normal speed for age/gender  ADL: ADL Overall ADL's : Needs assistance/impaired Eating/Feeding: Minimal assistance, Sitting Grooming: Minimal assistance, Sitting Upper Body Bathing: Moderate assistance, Sitting Lower Body Bathing: Maximal assistance, Sit to/from stand Upper Body Dressing : Maximal assistance, Sitting Upper Body Dressing Details (indicate cue type and reason): to don/doff brace and gown with max cues to maintain upright sitting posture Lower Body Dressing: Total assistance, Bed level Lower Body Dressing Details (indicate cue type and reason): to don socks Toilet Transfer: Moderate assistance, +2 for physical assistance, Stand-pivot Toilet Transfer Details (indicate cue type and reason): Simulated  by transfer EOB to chair Functional mobility during ADLs: Moderate assistance, +2 for physical assistance(for stand pivot only) General ADL Comments: Min assist for sit to stand with mod assist for side stepping toward [facility] for repositioning. No knee buckling noted today. Max cues for hand placement, technique, sequencing, and safety.  Cognition: Cognition Overall Cognitive Status: Impaired/Different from baseline Orientation Level: Disoriented to time Cognition Arousal/Alertness: Awake/alert Behavior During Therapy: WFL for tasks assessed/performed Overall Cognitive Status: Impaired/Different from baseline Area of Impairment: Following commands, Safety/judgement, Awareness, Problem solving Orientation Level: Disoriented to, Time Current Attention Level: Sustained Memory: Decreased recall of precautions, Decreased short-term memory Following Commands: Follows one step commands inconsistently Safety/Judgement: Decreased awareness of safety, Decreased awareness of deficits Awareness: Intellectual Problem Solving: Decreased initiation, Difficulty sequencing, Requires verbal cues, Requires tactile cues General Comments: Requires slow and direct one step cues for participation in tasks. Able to recall 3/3 precautions by end of session.     Blood pressure 132/67, pulse 96, temperature 98.2 F (36.8 C), temperature source Oral, resp. rate 16, height 5\' 3"  (1.6 m), weight 98 kg (216 lb 0.8 oz), SpO2 96 %. Physical Exam  Nursing note and vitals reviewed. Constitutional: He is oriented to person, place, and time. He appears well-developed and well-nourished. No distress.  Found patient with 3 pills lying on the collar of his gown while lying in bed  HENT:  Head: Normocephalic and atraumatic.  Eyes: Conjunctivae and EOM are normal. Pupils are equal, round, and reactive to light.  Neck: Normal range of motion. Neck supple.  Cardiovascular: Normal rate and regular rhythm. Exam reveals no  friction rub.  No murmur heard. Respiratory: Effort normal and breath sounds normal. No stridor. No respiratory distress. He has no wheezes.  GI: Soft. Bowel sounds are normal. He exhibits no distension. There is no tenderness.  Musculoskeletal: He exhibits no edema or tenderness.  Neurological: He is alert and oriented to person, place, and time.  Patient oriented to self and place as well as reason he is here.  Can tell me the month.  Decreased insight and awareness overall however.  Delays in processing.  Upper extremity strength grossly 4+ out of 5 proximal and distal.  Lower extremity 2 out of 5 hip flexion knee flexion/extension and 4 out of 5 ankle dorsiflexion plantarflexion with pain component in the lower limbs.  No gross sensory findings noted in the limbs and he can sense both light touch and pain.  Skin: Skin is warm and dry. He is not diaphoretic.  Left flank and hip incisions with honeycomb dressing--dry serous drainage noted. Upper mid back with dry dressing.   Psychiatric: He has a normal mood and affect. His behavior is normal. Judgment and thought content normal.    LabResultsLast48Hours  No results found for this or any previous visit (from the past 48 hour(s)).   ImagingResults(Last48hours)  No results found.       Medical Problem List and  Plan: 1.  Functional mobility deficits secondary to lumbar scoliosis with radiculopathy status post lumbar decompression and fusion with postoperative encephalopathy             -admit to inpatient rehab today 2.  DVT Prophylaxis/Anticoagulation: Mechanical: Sequential compression devices, below knee Bilateral lower extremities 3. Pain Management: Oxycodone prn effective. Continue Neurontin for neuropathy.  4. Mood: LCSW to follow for evaluation and support.  5. Neuropsych: This patient is capable of making decisions on his own behalf. 6. Skin/Wound Care: monitor incisions for healing.  7.  Fluids/Electrolytes/Nutrition: Monitor I/O. Offer supplements as needed if intake is poor. Check lytes in am.  8. DPO:EUMPNTI BP bid--continue Norvasc and HCTZ daily 9. OSA: Encourage consistent use of CPAP.  10. ABLA: Monitor H/H serially. Check CBC in am.       Post Admission Physician Evaluation: 1. Functional deficits secondary  to lumbar scoliosis/radiculopathy status post decompression and fusion with encephalopathy postop. 2. Patient is admitted to receive collaborative, interdisciplinary care between the physiatrist, rehab nursing staff, and therapy team. 3. Patient's level of medical complexity and substantial therapy needs in context of that medical necessity cannot be provided at a lesser intensity of care such as a SNF. 4. Patient has experienced substantial functional loss from his/her baseline which was documented above under the "Functional History" and "Functional Status" headings.  Judging by the patient's diagnosis, physical exam, and functional history, the patient has potential for functional progress which will result in measurable gains while on inpatient rehab.  These gains will be of substantial and practical use upon discharge  in facilitating mobility and self-care at the household level. 5. Physiatrist will provide 24 hour management of medical needs as well as oversight of the therapy plan/treatment and provide guidance as appropriate regarding the interaction of the two. 6. The Preadmission Screening has been reviewed and patient status is unchanged unless otherwise stated above. 7. 24 hour rehab nursing will assist with bladder management, bowel management, safety, skin/wound care, disease management, medication administration, pain management and patient education  and help integrate therapy concepts, techniques,education, etc. 8. PT will assess and treat for/with: Lower extremity strength, range of motion, stamina, balance, functional mobility, safety, adaptive  techniques and equipment, NMR, postoperative precautions, donning and doffing of back brace.   Goals are: Modified independent. 9. OT will assess and treat for/with: ADL's, functional mobility, safety, upper extremity strength, adaptive techniques and equipment, neuromuscular reeducation, donning and doffing of back brace, postoperative precautions.   Goals are: Modified independent. Therapy may proceed with showering this patient. 10. SLP will assess and treat for/with: n/a.  Goals are: n/a.  Will follow patient for now but do not see me for speech therapy intervention as encephalopathy seems to be slowly resolving.  We will engage speech therapy if needed. 11. Case Management and Social Worker will assess and treat for psychological issues and discharge planning. 12. Team conference will be held weekly to assess progress toward goals and to determine barriers to discharge. 13. Patient will receive at least 3 hours of therapy per day at least 5 days per week. 14. ELOS: 10-12 days       15. Prognosis:  excellent     Meredith Staggers, MD, [facility] 10/23/2017  Bary Leriche, PA-C 10/22/2017

## 2017-10-23 NOTE — Progress Notes (Signed)
RT went into pt's room to see if he wanted to wear his home CPAP unit, pt already had place it on. RT will continue to monitor.

## 2017-10-23 NOTE — Progress Notes (Signed)
Physical Medicine and Rehabilitation Consult   Reason for Consult: Functional deficits Referring Physician: Dr. Vertell Limber    HPI: Bobby [name] is a 62 y.o. male with history of HTN, OSA, back pain with numbness LUE and left arm due to scoliosis due to DDD. He was admitted on 10/18/17 for two part anterolateral lumbar fusion from L5-S1 by Dr. Vertell Limber. Post op with lethargy, confusion and difficulty following simple commands to complete ADLS. Has had incontinence of bowel and bladder per reports. CIR recommended due to functional decline.   He was independent and working full time PTA. Wife works nights.    Review of Systems  Constitutional: Negative for chills and fever.  HENT: Negative for hearing loss and tinnitus.   Eyes: Negative for blurred vision and double vision.  Respiratory: Negative for cough and sputum production.   Cardiovascular: Negative for chest pain and palpitations.  Gastrointestinal: Positive for nausea. Negative for abdominal pain and heartburn.       Hiccups  Musculoskeletal: Positive for back pain, myalgias and neck pain. Negative for joint pain.  Skin: Negative for itching and rash.  Neurological: Positive for sensory change (bilateral feet) and focal weakness (left side). Negative for dizziness and headaches.  Psychiatric/Behavioral: Negative for memory loss. The patient is not nervous/anxious.           Past Medical History:  Diagnosis Date  . Elevated liver enzymes   . GERD (gastroesophageal reflux disease)   . Heart murmur    as a child only; outgrew it  . History of kidney stones    last attack was in 2016  . Hypercholesterolemia   . Hypertension   . Osteoarthritis    spine  . Radiculopathy   . Scoliosis   . Sleep apnea    does not wear CPAP since weight loss  . Vitamin D deficiency   . Wears glasses          Past Surgical History:  Procedure Laterality Date  . ABDOMINAL EXPOSURE N/A 10/18/2017   Procedure:  ABDOMINAL EXPOSURE;  Surgeon: Rosetta Posner, MD;  Location: [place];  Service: Vascular;  Laterality: N/A;  Part-1  . ANTERIOR FUSION CERVICAL SPINE  2002   . ANTERIOR LATERAL LUMBAR FUSION 4 LEVELS Left 10/18/2017   Procedure: Left Lumbar One-Two,Lumbar Two-Three,Lumbar Three-Four Anterolateral lumbar interbody fusion;  Surgeon: Erline Levine, MD;  Location: [place];  Service: Neurosurgery;  Laterality: Left;  anterolateral approach Part-2  . ANTERIOR LUMBAR FUSION N/A 10/18/2017   Procedure: Lumbar Five-Sacral One Anterior lumbar interbody fusion with Dr. Curt Jews;  Surgeon: Erline Levine, MD;  Location: [place];  Service: Neurosurgery;  Laterality: N/A;  Part-1 Abdominal approach  . APPLICATION OF INTRAOPERATIVE CT SCAN N/A 10/19/2017   Procedure: APPLICATION OF INTRAOPERATIVE CT SCAN;  Surgeon: Erline Levine, MD;  Location: [place];  Service: Neurosurgery;  Laterality: N/A;  APPLICATION OF INTRAOPERATIVE CT SCAN  . BACK SURGERY    . COLONOSCOPY W/ BIOPSIES AND POLYPECTOMY    . LUMBAR LAMINECTOMY/DECOMPRESSION MICRODISCECTOMY Left 02/19/2016   Procedure: Laminectomy and Foraminotomy -Left Lumbar three-four, Lumbar four-five, Lumbar five-Sacral one ;  Surgeon: Ashok Pall, MD;  Location: [place] [facility];  Service: Neurosurgery;  Laterality: Left;  . POSTERIOR LUMBAR FUSION 4 LEVEL N/A 10/19/2017   Procedure: Thoracic Eight to Pelvis fixation and pedicle screws  Transforaminal Lumbar Interbody Fusion Lumbar Four-Five with possible osteotomy;  Surgeon: Erline Levine, MD;  Location: [place];  Service: Neurosurgery;  Laterality: N/A;  Thoracic Eight to Pelvis  fixation and pedicle screws  Transforaminal Lumbar Interbody Fusion Lumbar Four-Five with possible osteotomy          Family History  Problem Relation Age of Onset  . Hypertension Mother   . Kidney disease Mother   . Hyperlipidemia Mother   . Colon cancer Father   . Pancreatitis Unknown   . Diabetes Other     Social History:   Married. Works as Quarry manager. He reports that he quit smoking about 10 years ago. His smoking use included cigarettes and cigars. He has a 15.00 pack-year smoking history. he has never used smokeless tobacco. He reports that he drinks about 0.6 oz of alcohol per week. He reports that he does not use drugs.    Allergies: No Known Allergies          Medications Prior to Admission  Medication Sig Dispense Refill  . amLODipine (NORVASC) 2.5 MG tablet Take 1 tablet (2.5 mg total) by mouth daily. 90 tablet 3  . aspirin 81 MG tablet Take 81 mg by mouth daily.     Marland Kitchen atorvastatin (LIPITOR) 20 MG tablet TAKE 1 TABLET BY MOUTH  DAILY AT 6 PM. (Patient taking differently: TAKE 1 TABLET BY MOUTH  DAILY) 90 tablet 2  . hydrochlorothiazide (HYDRODIURIL) 12.5 MG tablet Take 1 tablet (12.5 mg total) by mouth daily. 90 tablet 3  . naproxen sodium (ALEVE) 220 MG tablet Take 220 mg by mouth daily as needed (pain).    Marland Kitchen tiZANidine (ZANAFLEX) 2 MG tablet TAKE 1 TABLET BY MOUTH EVERY 8 HOURS AS NEEDED FOR MUSCLE SPASMS  0  . traMADol (ULTRAM) 50 MG tablet Take 1 tablet (50 mg total) by mouth every 6 (six) hours as needed for moderate pain or severe pain. 30 tablet 0  . tiZANidine (ZANAFLEX) 4 MG tablet Take 1 tablet (4 mg total) by mouth every 6 (six) hours as needed for muscle spasms. (Patient not taking: Reported on 10/06/2017) 60 tablet 0    Home: Home Living Family/patient expects to be discharged to:: Private residence Living Arrangements: Spouse/significant other Available Help at Discharge: Family, Available 24 hours/day Type of Home: House Home Access: Stairs to enter Technical brewer of Steps: 3 Home Layout: One level Bathroom Shower/Tub: Tub/shower unit, Architectural technologist: Standard Home Equipment: None  Functional History: Prior Function Level of Independence: Independent Functional Status:  Mobility: Bed Mobility Overal bed mobility: Needs Assistance Bed Mobility:  Rolling, Sidelying to Sit, Sit to Sidelying Rolling: Min assist Sidelying to sit: Min assist Sit to supine: Max assist Sit to sidelying: Min assist General bed mobility comments: Max cues for sequencing log roll technique. HOB flat with use of bed rail.  Transfers Overall transfer level: Needs assistance Equipment used: Rolling walker (2 wheeled) Transfers: Sit to/from Stand Sit to Stand: Min assist Stand pivot transfers: Min assist, +2 physical assistance General transfer comment: Min assist to boost up from EOB. Cues for hand placement and sequencing. Pt able to side step at EOB wtih mod assist to reposition in bed. Ambulation/Gait Ambulation/Gait assistance: Mod assist, +2 physical assistance Ambulation Distance (Feet): 8 Feet Assistive device: Rolling walker (2 wheeled) Gait Pattern/deviations: Step-to pattern, Wide base of support, Trunk flexed General Gait Details: pt with poor positional awareness, max cues for posture and position in RW, chair to follow closely as pt with 2 periods of partial buckling. When pointing out to pt he was bucklign he said "no, that's fake buckling" but was unable to control it. Assist for balance, RW use  and direction and safety.  Gait velocity interpretation: Below normal speed for age/gender  ADL: ADL Overall ADL's : Needs assistance/impaired Eating/Feeding: Minimal assistance, Sitting Grooming: Minimal assistance, Sitting Upper Body Bathing: Moderate assistance, Sitting Lower Body Bathing: Maximal assistance, Sit to/from stand Upper Body Dressing : Maximal assistance, Sitting Upper Body Dressing Details (indicate cue type and reason): to don/doff brace and gown with max cues to maintain upright sitting posture Lower Body Dressing: Total assistance, Bed level Lower Body Dressing Details (indicate cue type and reason): to don socks Toilet Transfer: Moderate assistance, +2 for physical assistance, Stand-pivot Toilet Transfer Details (indicate cue  type and reason): Simulated by transfer EOB to chair Functional mobility during ADLs: Moderate assistance, +2 for physical assistance(for stand pivot only) General ADL Comments: Min assist for sit to stand with mod assist for side stepping toward [facility] for repositioning. No knee buckling noted today. Max cues for hand placement, technique, sequencing, and safety.  Cognition: Cognition Overall Cognitive Status: Impaired/Different from baseline Orientation Level: Disoriented to time Cognition Arousal/Alertness: Awake/alert Behavior During Therapy: WFL for tasks assessed/performed Overall Cognitive Status: Impaired/Different from baseline Area of Impairment: Following commands, Safety/judgement, Awareness, Problem solving Orientation Level: Disoriented to, Time Current Attention Level: Sustained Memory: Decreased recall of precautions, Decreased short-term memory Following Commands: Follows one step commands inconsistently Safety/Judgement: Decreased awareness of safety, Decreased awareness of deficits Awareness: Intellectual Problem Solving: Decreased initiation, Difficulty sequencing, Requires verbal cues, Requires tactile cues General Comments: Requires slow and direct one step cues for participation in tasks. Able to recall 3/3 precautions by end of session.    Blood pressure (!) 148/87, pulse 70, temperature 98.5 F (36.9 C), temperature source Oral, resp. rate (!) 26, height 5\' 3"  (1.6 m), weight 85.9 kg (189 lb 6 oz), SpO2 98 %. Physical Exam  Nursing note and vitals reviewed. Constitutional: He is oriented to person, place, and time. He appears well-developed and well-nourished. No distress.  Persistent hiccups  HENT:  Head: Normocephalic and atraumatic.  Eyes: Conjunctivae and EOM are normal. Pupils are equal, round, and reactive to light.  Neck: Normal range of motion. Neck supple.  Respiratory: Effort normal and breath sounds normal. No stridor.  GI: Soft. Bowel sounds are  normal. He exhibits distension. There is no tenderness.  Musculoskeletal: He exhibits no edema or tenderness.  Neurological: He is alert and oriented to person, place, and time.  Speech clear. Able to follow simple one and two step commands.  Relatively fair insight and awareness.  Some problems and delays with processing.  Upper extremity motor grossly 4+ to 5 out of 5.  Lower extremity 3 out of 5 hip flexion and knee extension to 4 out of 5 bilateral ankle dorsiflexion and plantar flexion.  Sensory exam appears grossly intact to pain and light touch in both limbs  Skin: Skin is warm and dry. He is not diaphoretic. No erythema.  Psychiatric: He has a normal mood and affect. His behavior is normal. Thought content normal.  Assessment/Plan: 1. Diagnosis: Debility and encephalopathy after 2 part lumbar fusion for lumbar scoliosis and radiculopathy. 2. Does the need for close, 24 hr/day medical supervision in concert with the patient's rehab needs make it unreasonable for this patient to be served in a less intensive setting? Yes 3. Co-Morbidities requiring supervision/potential complications: Pain management, hypertension, wound care 4. Due to bladder management, bowel management, safety, skin/wound care, disease management, medication administration, pain management and patient education, does the patient require 24 hr/day rehab nursing? Yes 5. Does the patient  require coordinated care of a physician, rehab nurse, PT (1-2 hrs/day, 5 days/week) and OT (1-2 hrs/day, 5 days/week) to address physical and functional deficits in the context of the above medical diagnosis(es)? Yes Addressing deficits in the following areas: balance, endurance, locomotion, strength, transferring, bowel/bladder control, bathing, dressing, feeding, grooming, toileting, cognition and psychosocial support 6. Can the patient actively participate in an intensive therapy program of at least 3 hrs of therapy per day at least 5 days per  week? Yes 7. The potential for patient to make measurable gains while on inpatient rehab is excellent 8. Anticipated functional outcomes upon discharge from inpatient rehab are modified independent  with PT, modified independent with OT, n/a with SLP. 9. Estimated rehab length of stay to reach the above functional goals is: 10-12 days 10. Anticipated D/C setting: Home 11. Anticipated post D/C treatments: HH therapy and Outpatient therapy 12. Overall Rehab/Functional Prognosis: excellent  RECOMMENDATIONS: This patient's condition is appropriate for continued rehabilitative care in the following setting: CIR Patient has agreed to participate in recommended program. Yes Note that insurance prior authorization may be required for reimbursement for recommended care.  Comment: Patient showing some cognitive improvement.  Nevertheless he needs to be modified independent when he returns home as his wife works. Rehab Admissions Coordinator to follow up.  Thanks,  Meredith Staggers, MD, Mellody Drown    Bary Leriche, PA-C 10/21/2017          Revision History                        Routing History

## 2017-10-23 NOTE — Care Management Note (Signed)
Case Management Note  Patient Details  Name: QUNICY HIGINBOTHAM MRN: [id number] Date of Birth: 1956-05-25  Subjective/Objective:                 DC to CIR   Action/Plan:   Expected Discharge Date:  10/23/17               Expected Discharge Plan:  IP Rehab Facility  In-House Referral:  Clinical Social Work  Discharge planning Services  CM Consult  Post Acute Care Choice:    Choice offered to:     DME Arranged:    DME Agency:     HH Arranged:    [place] Agency:     Status of Service:  Completed, signed off  If discussed at [employer], dates discussed:    Additional Comments:  Carles Collet, RN 10/23/2017, 10:44 AM

## 2017-10-23 NOTE — Progress Notes (Signed)
Patient to discharge to CIR.  Report given to receiving nurse.  Information given to patient re:  Change in routine for rehabilitation.  Patient medicated for pain with good effects.  Will transfer to CIF within the hour.

## 2017-10-23 NOTE — Discharge Summary (Signed)
Physician Discharge Summary  Patient ID: Bobby [name] MRN: [id number] DOB/AGE: 1956-02-20 62 y.o.  Admit date: 10/18/2017 Discharge date: 10/23/2017  Admission Diagnoses: Thoracolumbar scoliosis with instability and radiculopathy  Discharge Diagnoses: Thoracolumbar scoliosis with instability and radiculopathy, spinal deformity Active Problems:   Scoliosis of thoracolumbar spine   Discharged Condition: fair  Hospital Course: Patient was admitted to undergo surgical decompression and stabilization from T8 to the pelvis for thoracolumbar scoliosis of a degenerative nature. Tolerated surgery well. However because the patient lives alone we'll need inpatient rehabilitation. He is to be transferred to rehabilitation today  Consults: rehabilitation medicine  Significant Diagnostic Studies: None  Treatments: surgery: Posterior stabilization T8 to the pelvis with decompression of thoracolumbar scoliosis, deformity correction  Discharge Exam: Blood pressure 133/77, pulse 86, temperature 98.8 F (37.1 C), temperature source Oral, resp. rate 18, height 5\' 3"  (1.6 m), weight 98 kg (216 lb 0.8 oz), SpO2 96 %. Station and gait are limited with the use of significant external help. Incision is clean and dry  Disposition: Transfer to inpatient rehabilitation  Discharge Instructions    Diet - low sodium heart healthy   Complete by:  As directed    Increase activity slowly   Complete by:  As directed      Allergies as of 10/23/2017   No Known Allergies     Medication List    TAKE these medications   amLODipine 2.5 MG tablet Commonly known as:  NORVASC Take 1 tablet (2.5 mg total) by mouth daily.   aspirin 81 MG tablet Take 81 mg by mouth daily.   atorvastatin 20 MG tablet Commonly known as:  LIPITOR TAKE 1 TABLET BY MOUTH  DAILY AT 6 PM. What changed:  See the new instructions.   hydrochlorothiazide 12.5 MG tablet Commonly known as:  HYDRODIURIL Take 1 tablet (12.5 mg total)  by mouth daily.   naproxen sodium 220 MG tablet Commonly known as:  ALEVE Take 220 mg by mouth daily as needed (pain).   tiZANidine 4 MG tablet Commonly known as:  ZANAFLEX Take 1 tablet (4 mg total) by mouth every 6 (six) hours as needed for muscle spasms.   tiZANidine 2 MG tablet Commonly known as:  ZANAFLEX TAKE 1 TABLET BY MOUTH EVERY 8 HOURS AS NEEDED FOR MUSCLE SPASMS   traMADol 50 MG tablet Commonly known as:  ULTRAM Take 1 tablet (50 mg total) by mouth every 6 (six) hours as needed for moderate pain or severe pain.        SignedEarleen Newport 10/23/2017, 10:08 AM

## 2017-10-23 NOTE — Progress Notes (Signed)
RT setup and placed pt on home CPAP machine.  CPAP in good condition no frayed wires noted.  CPAP plugged into red outlet, sterile water was added and machine placed within reach of pt.

## 2017-10-23 NOTE — Progress Notes (Signed)
PMR Admission Coordinator Pre-Admission Assessment  Patient: Bobby [name] is an 62 y.o., male MRN: [id number] DOB: 1956/04/01 Height: 5\' 3"  (160 cm) Weight: 98 kg (216 lb 0.8 oz)                                                                                                                                                  Insurance Information HMO:     PPO:      PCP:      IPA:      80/20:      OTHER: [employer]             PRIMARY: [employer] Commercial       Policy#: [id number]      Subscriber: Self CM Name: Vevelyn Royals       Phone#: [phone]     Fax#: [phone] Pre-Cert#: [id number]      Employer:  Benefits:  Phone #: [phone]     Name: Verified online at [email or web address] Eff. Date: 10/05/17     Deduct: $500      Out of Pocket Max: $2500      Life Max: N/A CIR: 90%/10%      SNF: 90%/10% (120 day limit) Outpatient: 90%     Co-Pay: 10% (60 visits per PT, OT, SLP) Home Health: 90%      Co-Pay: 10% (100 visits) DME: 90%     Co-Pay: 10% Providers: In-network   SECONDARY: None      Policy#:       Subscriber:  CM Name:       Phone#:      Fax#:  Pre-Cert#:       Employer:  Benefits:  Phone #:      Name:  Eff. Date:      Deduct:       Out of Pocket Max:       Life Max:  CIR:       SNF:  Outpatient:      Co-Pay:  Home Health:       Co-Pay:  DME:      Co-Pay:   Medicaid Application Date:       Case Manager:  Disability Application Date:       Case Worker:   Emergency Contact Information        Contact Information    Name Relation Home Work Mobile   Knaus,Suzanne Spouse [phone]       Current Medical History  Patient Admitting Diagnosis: Debility and encephalopathy after 2 part lumbar fusion for lumbar scoliosis and radiculopathy.   History of Present Illness: Bobby [name] a 62 y.o.malewith history of HTN, OSA, back pain with numbness LUE and left arm due to scoliosis due to DDD. He was admitted on 10/18/17 for two part anterolateral lumbar fusion from L5-S1 by Dr.  Vertell Limber. Post op with lethargy,  confusion and difficulty following simple commands to complete ADLS.He hashad incontinence of bowel and bladder per reports.Pain control improving.CIR recommended due to functional decline.    Past Medical History      Past Medical History:  Diagnosis Date  . Elevated liver enzymes   . GERD (gastroesophageal reflux disease)   . Heart murmur    as a child only; outgrew it  . History of kidney stones    last attack was in 2016  . Hypercholesterolemia   . Hypertension   . Osteoarthritis    spine  . Radiculopathy   . Scoliosis   . Sleep apnea    does not wear CPAP since weight loss  . Vitamin D deficiency   . Wears glasses     Family History  family history includes Colon cancer in his father; Diabetes in his other; Hyperlipidemia in his mother; Hypertension in his mother; Kidney disease in his mother; Pancreatitis in his unknown relative.  Prior Rehab/Hospitalizations:  Has the patient had major surgery during 100 days prior to admission? No  Current Medications   Current Facility-Administered Medications:  .  0.9 %  sodium chloride infusion, , Intravenous, Once, Scheryl Darter, CRNA .  0.9 %  sodium chloride infusion, 250 mL, Intravenous, Continuous, Erline Levine, MD .  acetaminophen (TYLENOL) tablet 650 mg, 650 mg, Oral, Q4H PRN **OR** acetaminophen (TYLENOL) suppository 650 mg, 650 mg, Rectal, Q4H PRN, Erline Levine, MD .  alum & mag hydroxide-simeth (MAALOX/MYLANTA) 200-200-20 MG/5ML suspension 30 mL, 30 mL, Oral, Q6H PRN, Erline Levine, MD .  amLODipine [facility]) tablet 2.5 mg, 2.5 mg, Oral, Daily, Erline Levine, MD, 2.5 mg at 10/22/17 0944 .  aspirin chewable tablet 81 mg, 81 mg, Oral, Daily, Erline Levine, MD, 81 mg at 10/22/17 0943 .  atorvastatin (LIPITOR) tablet 20 mg, 20 mg, Oral, Daily, Erline Levine, MD, 20 mg at 10/22/17 0943 .  bisacodyl (DULCOLAX) suppository 10 mg, 10 mg, Rectal, Daily, Erline Levine,  MD .  bisacodyl (DULCOLAX) suppository 10 mg, 10 mg, Rectal, Daily PRN, Erline Levine, MD .  chlorproMAZINE (THORAZINE) 25 mg in sodium chloride 0.9 % 25 mL IVPB, 25 mg, Intravenous, Q6H PRN, Erline Levine, MD, Stopped at 10/21/17 1649 .  dextrose 5 % and 0.45 % NaCl with KCl 20 mEq/L infusion, , Intravenous, Continuous, Erline Levine, MD, Last Rate: 100 mL/hr at 10/21/17 1840 .  docusate sodium (COLACE) capsule 100 mg, 100 mg, Oral, BID, Erline Levine, MD, 100 mg at 10/22/17 0944 .  gabapentin (NEURONTIN) capsule 300 mg, 300 mg, Oral, TID, Erline Levine, MD, 300 mg at 10/22/17 1615 .  hydrochlorothiazide (HYDRODIURIL) tablet 12.5 mg, 12.5 mg, Oral, Daily, Erline Levine, MD, 12.5 mg at 10/22/17 0943 .  menthol-cetylpyridinium (CEPACOL) lozenge 3 mg, 1 lozenge, Oral, PRN **OR** phenol (CHLORASEPTIC) mouth spray 1 spray, 1 spray, Mouth/Throat, PRN, Erline Levine, MD .  methocarbamol (ROBAXIN) tablet 500 mg, 500 mg, Oral, Q6H PRN, 500 mg at 10/21/17 2124 **OR** methocarbamol (ROBAXIN) 500 mg in dextrose 5 % 50 mL IVPB, 500 mg, Intravenous, Q6H PRN, Erline Levine, MD .  morphine 4 MG/ML injection 4 mg, 4 mg, Intravenous, Q2H PRN, Erline Levine, MD, 4 mg at 10/20/17 2026 .  ondansetron (ZOFRAN) tablet 4 mg, 4 mg, Oral, Q6H PRN, 4 mg at 10/19/17 0043 **OR** ondansetron (ZOFRAN) injection 4 mg, 4 mg, Intravenous, Q6H PRN, Erline Levine, MD .  oxyCODONE (Oxy IR/ROXICODONE) immediate release tablet 10 mg, 10 mg, Oral, Q3H PRN, Erline Levine, MD, 10 mg at  10/22/17 1615 .  oxyCODONE (Oxy IR/ROXICODONE) immediate release tablet 5 mg, 5 mg, Oral, Q3H PRN, Erline Levine, MD .  pantoprazole (PROTONIX) EC tablet 40 mg, 40 mg, Oral, QHS, Erline Levine, MD .  [DISCONTINUED] menthol-cetylpyridinium (CEPACOL) lozenge 3 mg, 1 lozenge, Oral, PRN **OR** phenol (CHLORASEPTIC) mouth spray 1 spray, 1 spray, Mouth/Throat, PRN, Erline Levine, MD .  polyethylene glycol (MIRALAX / GLYCOLAX) packet 17 g, 17 g, Oral, Daily PRN,  Erline Levine, MD .  sodium chloride flush (NS) 0.9 % injection 3 mL, 3 mL, Intravenous, Q12H, Erline Levine, MD, 3 mL at 10/21/17 2125 .  sodium chloride flush (NS) 0.9 % injection 3 mL, 3 mL, Intravenous, PRN, Erline Levine, MD .  sodium phosphate (FLEET) 7-19 GM/118ML enema 1 enema, 1 enema, Rectal, Once PRN, Erline Levine, MD .  tiZANidine (ZANAFLEX) tablet 4 mg, 4 mg, Oral, Q6H PRN, Erline Levine, MD .  zolpidem [facility]) tablet 5 mg, 5 mg, Oral, QHS PRN,MR X 1, Erline Levine, MD, 5 mg at 10/20/17 2241  Patients Current Diet: Diet regular Room service appropriate? Yes; Fluid consistency: Thin  Precautions / Restrictions Precautions Precautions: Back, Fall Precaution Booklet Issued: No Precaution Comments: Pt able to recall 3/3 back precautions Spinal Brace: Thoracolumbosacral orthotic, Applied in sitting position Restrictions Weight Bearing Restrictions: No   Has the patient had 2 or more falls or a fall with injury in the past year?No  Prior Activity Level Community (5-7x/wk): Prior to admission patient was working working full time for [employer] and fully independent.    Home Assistive Devices / Equipment Home Assistive Devices/Equipment: Eyeglasses Home Equipment: None  Prior Device Use: Indicate devices/aids used by the patient prior to current illness, exacerbation or injury? None of the above  Prior Functional Level Prior Function Level of Independence: Independent  Self Care: Did the patient need help bathing, dressing, using the toilet or eating  Independent  Indoor Mobility: Did the patient need assistance with walking from room to room (with or without device)? Independent  Stairs: Did the patient need assistance with internal or external stairs (with or without device)? Independent  Functional Cognition: Did the patient need help planning regular tasks such as shopping or remembering to take medications? Independent  Current Functional  Level Cognition  Overall Cognitive Status: Impaired/Different from baseline Current Attention Level: Sustained Orientation Level: Disoriented to time Following Commands: Follows one step commands inconsistently Safety/Judgement: Decreased awareness of safety, Decreased awareness of deficits General Comments: Requires slow and direct one step cues for participation in tasks. Able to recall 3/3 precautions by end of session.      Extremity Assessment (includes Sensation/Coordination)  Upper Extremity Assessment: Generalized weakness  Lower Extremity Assessment: Generalized weakness    ADLs  Overall ADL's : Needs assistance/impaired Eating/Feeding: Minimal assistance, Sitting Grooming: Minimal assistance, Sitting Upper Body Bathing: Moderate assistance, Sitting Lower Body Bathing: Maximal assistance, Sit to/from stand Upper Body Dressing : Maximal assistance, Sitting Upper Body Dressing Details (indicate cue type and reason): for brace and gown Lower Body Dressing: Total assistance, Bed level Lower Body Dressing Details (indicate cue type and reason): to don socks Toilet Transfer: Minimal assistance, Stand-pivot, RW Toilet Transfer Details (indicate cue type and reason): Simulated by transfer chair to bed. Cues for hand placement and technique Functional mobility during ADLs: Minimal assistance, Rolling walker(for stand pivot only ) General ADL Comments: Min assist for sit to stand with mod assist for side stepping toward [facility] for repositioning. No knee buckling noted today. Max cues for hand  placement, technique, sequencing, and safety.    Mobility  Overal bed mobility: Needs Assistance Bed Mobility: Rolling, Sit to Sidelying Rolling: Min guard Sidelying to sit: Mod assist Sit to supine: Max assist Sit to sidelying: Min assist General bed mobility comments: Light min assist for LEs back to bed. Cues for log roll technique. HOB flat with use of bed rails.    Transfers   Overall transfer level: Needs assistance Equipment used: Rolling walker (2 wheeled) Transfers: Sit to/from Stand, [employer] Transfers Sit to Stand: Min assist Stand pivot transfers: Min assist General transfer comment: Min assist to boost up from chair with min assist for pivot to bed. Cues for hand placement and technique    Ambulation / Gait / Stairs / Wheelchair Mobility  Ambulation/Gait Ambulation/Gait assistance: Mod assist, +2 safety/equipment Ambulation Distance (Feet): 30 Feet Assistive device: Rolling walker (2 wheeled) Gait Pattern/deviations: Step-to pattern, Wide base of support, Trunk flexed, Decreased step length - right, Decreased step length - left General Gait Details: Assist for support and balance as well as guiding walker. Frequent cues for posture and technique. Pt with bilateral knees with slight give but no overt buckling. Gait velocity: decr Gait velocity interpretation: Below normal speed for age/gender    Posture / Balance Dynamic Sitting Balance Sitting balance - Comments: Min guard to min assist due to posterior lean. Pt could correct when cued but seemed unaware of the lean. Balance Overall balance assessment: Needs assistance Sitting-balance support: Feet supported, No upper extremity supported Sitting balance-Leahy Scale: Fair Sitting balance - Comments: Min guard to min assist due to posterior lean. Pt could correct when cued but seemed unaware of the lean. Postural control: Posterior lean Standing balance support: Bilateral upper extremity supported, During functional activity Standing balance-Leahy Scale: Poor Standing balance comment: walker and min to mod assist for static standing    Special needs/care consideration BiPAP/CPAP: Yes, CPAP CPM: No Continuous Drip IV: No Dialysis: No         Life Vest: No Oxygen: No Special Bed: No Trach Size: No Wound Vac (area): No       Skin: Dry                              Location: Surgical incision  - back Bowel mgmt: Incontinent, last BM 10/21/17 Bladder mgmt: Continent most recently  Diabetic mgmt: No     Previous Home Environment Living Arrangements: Spouse/significant other Available Help at Discharge: Family, Available 24 hours/day Type of Home: House Home Layout: One level Home Access: Stairs to enter Technical brewer of Steps: 3 Bathroom Shower/Tub: Tub/shower unit, Architectural technologist: Standard Home Care Services: No  Discharge Living Setting Plans for Discharge Living Setting: Patient's home, Lives with (comment)(Spouse ) Type of Home at Discharge: House Discharge Home Layout: One level Discharge Home Access: Stairs to enter Entrance Stairs-Rails: Can reach both Entrance Stairs-Number of Steps: 3 Discharge Bathroom Shower/Tub: Tub/shower unit, Curtain Discharge Bathroom Toilet: Standard Discharge Bathroom Accessibility: Yes How Accessible: Accessible via walker Does the patient have any problems obtaining your medications?: No  Social/Family/Support Systems Patient Roles: Spouse Contact Information: Spouse: Rapheal Masso  Anticipated Caregiver: Mod I goals since wife works nights  Anticipated Ambulance person Information: [phone] Ability/Limitations of Caregiver: she works and patient need to be Mod I  Caregiver Availability: Intermittent Discharge Plan Discussed with Primary Caregiver: (Discussed with patient) Is Caregiver In Agreement with Plan?: (Patient in agreement with plan ) Does Caregiver/Family  have Issues with Lodging/Transportation while Pt is in Rehab?: No  Goals/Additional Needs Patient/Family Goal for Rehab: OT/PT: Mod I Expected length of stay: 10-12 days  Cultural Considerations: None Dietary Needs: Regular textures and thin liquids  Equipment Needs: TBD Special Service Needs: None Pt/Family Agrees to Admission and willing to participate: Yes Program Orientation Provided & Reviewed with Pt/Caregiver Including Roles   & Responsibilities: Yes Additional Information Needs: Discussed transition to oral pains meds with IP Rehab admission  Information Needs to be Provided By: Team FYI   Decrease burden of Care through IP rehab admission: No  Possible need for SNF placement upon discharge: No  Patient Condition: This patient's condition remains as documented in the consult dated 10/22/17 at 12:31pm, in which the Rehabilitation Physician determined and documented that the patient's condition is appropriate for intensive rehabilitative care in an inpatient rehabilitation facility. Will admit to inpatient rehab tomorrow.  Preadmission Screen Completed By:  Gunnar Fusi, 10/22/2017 7:20 PM ______________________________________________________________________   Discussed status with Dr. Naaman Plummer on 10/22/17 at 78 and received telephone approval for admission tomorrow.  Admission Coordinator:  Gunnar Fusi, time 1700/Date 10/22/17             Cosigned by: Meredith Staggers, MD at 10/23/2017 10:16 AM  Revision History

## 2017-10-23 NOTE — Progress Notes (Signed)
Patient arrived with RN and NT, spouse. Oriented to unit, verbalized agreement to call for assistance in the room.

## 2017-10-24 ENCOUNTER — Inpatient Hospital Stay (HOSPITAL_COMMUNITY): Payer: 59 | Admitting: Occupational Therapy

## 2017-10-24 ENCOUNTER — Inpatient Hospital Stay (HOSPITAL_COMMUNITY): Payer: 59

## 2017-10-24 DIAGNOSIS — N5089 Other specified disorders of the male genital organs: Secondary | ICD-10-CM

## 2017-10-24 DIAGNOSIS — E8809 Other disorders of plasma-protein metabolism, not elsewhere classified: Secondary | ICD-10-CM

## 2017-10-24 DIAGNOSIS — D62 Acute posthemorrhagic anemia: Secondary | ICD-10-CM

## 2017-10-24 LAB — CBC WITH DIFFERENTIAL/PLATELET
BASOS ABS: 0 10*3/uL (ref 0.0–0.1)
Basophils Relative: 0 %
EOS ABS: 0.3 10*3/uL (ref 0.0–0.7)
EOS PCT: 3 %
HCT: 25.8 % — ABNORMAL LOW (ref 39.0–52.0)
Hemoglobin: 8.3 g/dL — ABNORMAL LOW (ref 13.0–17.0)
LYMPHS PCT: 11 %
Lymphs Abs: 1.2 10*3/uL (ref 0.7–4.0)
MCH: 29 pg (ref 26.0–34.0)
MCHC: 32.2 g/dL (ref 30.0–36.0)
MCV: 90.2 fL (ref 78.0–100.0)
MONO ABS: 1.7 10*3/uL — AB (ref 0.1–1.0)
Monocytes Relative: 15 %
Neutro Abs: 7.7 10*3/uL (ref 1.7–7.7)
Neutrophils Relative %: 71 %
PLATELETS: 202 10*3/uL (ref 150–400)
RBC: 2.86 MIL/uL — AB (ref 4.22–5.81)
RDW: 14.7 % (ref 11.5–15.5)
WBC: 10.9 10*3/uL — AB (ref 4.0–10.5)

## 2017-10-24 LAB — COMPREHENSIVE METABOLIC PANEL
ALT: 90 U/L — ABNORMAL HIGH (ref 17–63)
AST: 118 U/L — AB (ref 15–41)
Albumin: 2.3 g/dL — ABNORMAL LOW (ref 3.5–5.0)
Alkaline Phosphatase: 158 U/L — ABNORMAL HIGH (ref 38–126)
Anion gap: 9 (ref 5–15)
BUN: 13 mg/dL (ref 6–20)
CHLORIDE: 103 mmol/L (ref 101–111)
CO2: 26 mmol/L (ref 22–32)
Calcium: 7.8 mg/dL — ABNORMAL LOW (ref 8.9–10.3)
Creatinine, Ser: 0.98 mg/dL (ref 0.61–1.24)
Glucose, Bld: 123 mg/dL — ABNORMAL HIGH (ref 65–99)
POTASSIUM: 3.8 mmol/L (ref 3.5–5.1)
SODIUM: 138 mmol/L (ref 135–145)
Total Bilirubin: 1 mg/dL (ref 0.3–1.2)
Total Protein: 5.1 g/dL — ABNORMAL LOW (ref 6.5–8.1)

## 2017-10-24 MED ORDER — PREMIER PROTEIN SHAKE
2.0000 [oz_av] | Freq: Two times a day (BID) | ORAL | Status: DC
  Administered 2017-10-24 – 2017-10-28 (×8): 2 [oz_av] via ORAL
  Filled 2017-10-24 (×14): qty 325.31

## 2017-10-24 MED ORDER — FERROUS SULFATE 325 (65 FE) MG PO TABS
325.0000 mg | ORAL_TABLET | Freq: Two times a day (BID) | ORAL | Status: DC
  Administered 2017-10-24 – 2017-10-29 (×11): 325 mg via ORAL
  Filled 2017-10-24 (×11): qty 1

## 2017-10-24 NOTE — Plan of Care (Signed)
LTGs established 10/24/17

## 2017-10-24 NOTE — Evaluation (Signed)
Occupational Therapy Assessment and Plan  Patient Details  Name: Bobby [name] MRN: [id number] Date of Birth: 05-10-1956  OT Diagnosis: abnormal posture, acute pain, cognitive deficits, muscle weakness (generalized), pain in joint and pain in thoracic spine Rehab Potential: Rehab Potential (ACUTE ONLY): Excellent ELOS: 8-10     Today's Date: 10/24/2017 OT Individual Time: 1601-0932 and 3557-3220 OT Individual Treatment Time Calculation: 60 min and 60 min  Problem List:  Patient Active Problem List   Diagnosis Date Noted  . Scoliosis of lumbar region due to degenerative disease of spine in adult 10/23/2017  . Scoliosis of thoracolumbar spine 10/18/2017  . Hypertension, essential 08/30/2017  . Neck pain 01/08/2017  . Osteoarthritis of spine with radiculopathy, lumbar region 02/19/2016  . Foot drop, left 11/29/2015  . Encounter for general adult medical examination with abnormal findings 11/29/2015    Past Medical History:  Past Medical History:  Diagnosis Date  . Elevated liver enzymes   . GERD (gastroesophageal reflux disease)   . Heart murmur    as a child only; outgrew it  . History of kidney stones    last attack was in 2016  . Hypercholesterolemia   . Hypertension   . Osteoarthritis    spine  . Radiculopathy   . Scoliosis   . Sleep apnea    does not wear CPAP since weight loss  . Vitamin D deficiency   . Wears glasses    Past Surgical History:  Past Surgical History:  Procedure Laterality Date  . ABDOMINAL EXPOSURE N/A 10/18/2017   Procedure: ABDOMINAL EXPOSURE;  Surgeon: Rosetta Posner, MD;  Location: [place];  Service: Vascular;  Laterality: N/A;  Part-1  . ANTERIOR FUSION CERVICAL SPINE  2002   . ANTERIOR LATERAL LUMBAR FUSION 4 LEVELS Left 10/18/2017   Procedure: Left Lumbar One-Two,Lumbar Two-Three,Lumbar Three-Four Anterolateral lumbar interbody fusion;  Surgeon: Erline Levine, MD;  Location: [place];  Service: Neurosurgery;  Laterality: Left;  anterolateral  approach Part-2  . ANTERIOR LUMBAR FUSION N/A 10/18/2017   Procedure: Lumbar Five-Sacral One Anterior lumbar interbody fusion with Dr. Curt Jews;  Surgeon: Erline Levine, MD;  Location: [place];  Service: Neurosurgery;  Laterality: N/A;  Part-1 Abdominal approach  . APPLICATION OF INTRAOPERATIVE CT SCAN N/A 10/19/2017   Procedure: APPLICATION OF INTRAOPERATIVE CT SCAN;  Surgeon: Erline Levine, MD;  Location: [place];  Service: Neurosurgery;  Laterality: N/A;  APPLICATION OF INTRAOPERATIVE CT SCAN  . BACK SURGERY    . COLONOSCOPY W/ BIOPSIES AND POLYPECTOMY    . LUMBAR LAMINECTOMY/DECOMPRESSION MICRODISCECTOMY Left 02/19/2016   Procedure: Laminectomy and Foraminotomy -Left Lumbar three-four, Lumbar four-five, Lumbar five-Sacral one ;  Surgeon: Ashok Pall, MD;  Location: [place] [facility];  Service: Neurosurgery;  Laterality: Left;  . POSTERIOR LUMBAR FUSION 4 LEVEL N/A 10/19/2017   Procedure: Thoracic Eight to Pelvis fixation and pedicle screws  Transforaminal Lumbar Interbody Fusion Lumbar Four-Five with possible osteotomy;  Surgeon: Erline Levine, MD;  Location: [place];  Service: Neurosurgery;  Laterality: N/A;  Thoracic Eight to Pelvis fixation and pedicle screws  Transforaminal Lumbar Interbody Fusion Lumbar Four-Five with possible osteotomy     Assessment & Plan Clinical Impression: Bobby [name] a 62 y.o.malewith history of HTN, OSA, back pain with numbness LUE and left arm due to scoliosis due to DDD. He was admitted on 10/18/17 for two part anterolateral lumbar fusion from L5-S1 by Dr. Vertell Limber. Post op with lethargy, confusion and difficulty following simple commands to complete ADLS.He hashad incontinence of bowel and  bladder per reports.Pain control improving.CIR recommended due to functional decline.   Patient currently requires min-max with basic self-care skills secondary to muscle weakness, decreased cardiorespiratoy endurance, decreased memory and decreased standing balance and  difficulty maintaining precautions.  Prior to hospitalization, patient could complete BADLs with independent .  Patient will benefit from skilled intervention to increase independence with basic self-care skills prior to discharge home with spouse Vinnie Level.  Anticipate patient will require 24 hour supervision and follow up home health.  OT - End of Session Endurance Deficit: Yes OT Assessment Rehab Potential (ACUTE ONLY): Excellent OT Barriers to Discharge: Medical stability OT Patient demonstrates impairments in the following area(s): Balance;Pain;Cognition;Safety;Endurance;Skin Integrity;Motor OT Basic ADL's Functional Problem(s): Grooming;Bathing;Dressing;Toileting OT Advanced ADL's Functional Problem(s): Simple Meal Preparation OT Transfers Functional Problem(s): Toilet;Tub/Shower OT Additional Impairment(s): None OT Plan OT Intensity: Minimum of 1-2 x/day, 45 to 90 minutes OT Frequency: 5 out of 7 days OT Duration/Estimated Length of Stay: 8-10 OT Treatment/Interventions: Balance/vestibular training;Discharge planning;Functional electrical stimulation;Pain management;Self Care/advanced ADL retraining;Therapeutic Activities;UE/LE Coordination activities;Visual/perceptual remediation/compensation;Therapeutic Exercise;Skin care/wound managment;Patient/family education;Functional mobility training;Disease mangement/prevention;Cognitive remediation/compensation;Community reintegration;Neuromuscular re-education;DME/adaptive equipment instruction;Psychosocial support;Splinting/orthotics;UE/LE Strength taining/ROM;Wheelchair propulsion/positioning OT Self Feeding Anticipated Outcome(s): No goal OT Basic Self-Care Anticipated Outcome(s): Supervision/setup-Mod I  OT Toileting Anticipated Outcome(s): Mod I OT Bathroom Transfers Anticipated Outcome(s): Supervision/setup-Mod I OT Recommendation Recommendations for Other Services: Speech consult Patient destination: Home Follow Up Recommendations:  Home health OT Equipment Recommended: Tub/shower bench;3 in 1 bedside comode   Skilled Therapeutic Intervention Skilled OT session completed with focus on initial evaluation, education on OT role/POC, and establishment of patient-centered goals.   Pt greeted mid-transfer to recliner without staff assist. TLSO was donned. Educated pt on need to have staff assist with transfers with verbalized understanding. He was agreeable to shower. Stand pivot<w/c<TTB completed with Min A, using RW as needed. While pt was seated in shower, back brace was doffed and incision site covered. Pt bathing at sit<stand level with overall Mod A for reaching LEs/buttocks. Min cues required for mgt of hand held shower and avoidance of twisting motions. He then proceeded to dress w/c level at sink, however opted to remove paper clothing afterwards due to tightness of fit. Pt assisting OT with hospital gown snaps and ties, required extra time for [facility] but able to meet FM demands. Oral care completed afterwards with setup while seated. Max A/cues for orthosis mgt required throughout session. Also practiced stand pivot toilet transfers with Min A, discussed his need for hygiene assist from RN staff due to limited UE IR and inability to twist at this time. Pt verbalized understanding (as did Conservation officer, historic buildings when educated by OT after). Pt then ambulated from toilet back to recliner with Min A. He was repositioned for comfort and left with all needs within reach.   Able to recall 3/3 back precautions during tx   2nd Session 1: 1 tx (60 min) Pt greeted in recliner, TLSO was donned upside down. Extensive education provided on donning TLSO correctly while adhering to back precautions. Pt requiring max instructional cues initially with several demonstrations, fading to requiring mod instructional/questioning cues. He benefits from additional practice. Afterwards began reacher and sock aide training, with pt having hands on practice during tx and  able to thread bilateral LEs into paper pants and don bilateral gripper socks with supervision and min cues for avoidance of forward bending. Advised him to notify spouse to bring in loose baggy clothing for increased ease of AE use. Also provided him with reacher/walker bags and also LH  sponge for ADL sessions this week. Pt requiring multiple seated rest breaks throughout due to fatigue, but very motivated to use AE and demonstrate carryover of OT education. At end of tx pt was repositioned in recliner for comfort and left with all needs within reach.   OT Evaluation Precautions/Restrictions  Precautions Precautions: Back;Fall Precaution Booklet Issued: No Precaution Comments: Pt able to recall 3/3 back precautions Required Braces or Orthoses: Spinal Brace Spinal Brace: Thoracolumbosacral orthotic;Applied in sitting position Restrictions Weight Bearing Restrictions: No General Chart Reviewed: Yes Family/Caregiver Present: No Pain: C/o 9/10 back pain during session. RN made aware. Discussed therapeutic use of music for pain mgt at rest in conjunction with pain medication    Home Living/Prior [place] expects to be discharged to:: Private residence Living Arrangements: Spouse/significant other Available Help at Discharge: Family, Available 24 hours/day Type of Home: House Home Access: Stairs to enter [employer] of Steps: 3 Entrance Stairs-Rails: Right, Left, Can reach both Home Layout: One level Bathroom Shower/Tub: Tub/shower unit, Architectural technologist: Standard Bathroom Accessibility: Yes Additional Comments: Pt verbalizing need for TTB + BSC to elevate toilet height   Lives With: Spouse IADL History Homemaking Responsibilities: No(IADLs primary responsibility of spouse) Occupation: Full time employment Type of Occupation: Quarry manager at [employer]: Taking care of dogs + gardening Prior Function Level of Independence:  Independent with basic ADLs, Independent with gait  Able to Take Stairs?: Yes Driving: Yes Vocation: Full time employment Vocation Requirements: lab tech Leisure: Hobbies-yes (Comment) Comments: yard work ADL ADL ADL Comments: Please see functional navigator for ADL status Vision Baseline Vision/History: Wears glasses Wears Glasses: At all times Patient Visual Report: No change from baseline Vision Assessment?: No apparent visual deficits Additional Comments: Able to scan for necessary items on sink + read walk clock, room signs Perception  Perception: Within Functional Limits Praxis Praxis: Intact Cognition Overall Cognitive Status: Impaired/Different from baseline Arousal/Alertness: Awake/alert Orientation Level: Person;Place;Situation Person: Oriented Place: Oriented Situation: Oriented Year: 2019 Month: January Day of Week: Correct Memory: Impaired Memory Impairment: Decreased recall of new information Immediate Memory Recall: Sock;Blue;Bed Memory Recall: Sock;Blue;Bed Memory Recall Sock: Without Cue Memory Recall Blue: Without Cue Memory Recall Bed: Without Cue Attention: Sustained Sustained Attention: Appears intact Problem Solving: Appears intact Safety/Judgment: Appears intact Sensation Sensation Light Touch: Appears Intact(bilateral UEs, per pt report) Stereognosis: Not tested Hot/Cold: Appears Intact Proprioception: Appears Intact Coordination Gross Motor Movements are Fluid and Coordinated: Yes Fine Motor Movements are Fluid and Coordinated: Yes Motor  Motor Motor: Within Functional Limits Motor - Skilled Clinical Observations: generalized weakness + decreased cardiorespiratory endurance Mobility  Transfers Transfers: Sit to Stand;Stand to Sit Sit to Stand: 4: Min assist;From toilet Sit to Stand Details: Verbal cues for precautions/safety;Verbal cues for safe use of DME/AE;Verbal cues for technique Stand to Sit: 4: Min assist;To toilet Stand to  Sit Details (indicate cue type and reason): Verbal cues for precautions/safety;Verbal cues for safe use of DME/AE;Verbal cues for technique  Trunk/Postural Assessment  Cervical Assessment Cervical Assessment: Within Functional Limits Thoracic Assessment Thoracic Assessment: Exceptions to WFL(N/A due to back precautions) Lumbar Assessment Lumbar Assessment: Exceptions to WFL(N/A due to back precautions) Postural Control Postural Control: Deficits With Evaluation: Comments: posterior lean while sitting unsupported in w/c during dressing tasks  Balance Balance Balance Assessed: Yes Dynamic Sitting Balance Dynamic Sitting - Level of Assistance: 5: Stand by assistance Sitting balance - Comments: Bathing UB on TTB Dynamic Standing Balance Dynamic Standing - Level of Assistance: 4: Min  assist(Frontal pericare completion while standing in shower) Extremity/Trunk Assessment RUE Assessment RUE Assessment: Exceptions to WFL(internal rotation deficits, impacting independence with perihygiene) LUE Assessment LUE Assessment: Exceptions to WFL(Internal rotation deficits, impacing independence with perihygiene)   See Function Navigator for Current Functional Status.   Refer to Care Plan for Long Term Goals  Recommendations for other services: Other: SLP   Discharge Criteria: Patient will be discharged from OT if patient refuses treatment 3 consecutive times without medical reason, if treatment goals not met, if there is a change in medical status, if patient makes no progress towards goals or if patient is discharged from hospital.  The above assessment, treatment plan, treatment alternatives and goals were discussed and mutually agreed upon: by patient  Skeet Simmer 10/24/2017, 11:41 AM

## 2017-10-24 NOTE — Progress Notes (Signed)
[place] PHYSICAL MEDICINE & REHABILITATION     PROGRESS NOTE    Subjective/Complaints: Overall did quite well last night.  Denies any changes in his pain.  Was able to sleep.  Does have some scrotal swelling  ROS: pt denies nausea, vomiting, diarrhea, cough, shortness of breath or chest pain   Objective: Vital Signs: Blood pressure 135/72, pulse 95, temperature 98.6 F (37 C), temperature source Oral, resp. rate 20, height 5\' 3"  (1.6 m), weight 106.2 kg (234 lb 2.1 oz), SpO2 94 %. No results found. Recent Labs    10/24/17 0542  WBC 10.9*  HGB 8.3*  HCT 25.8*  PLT 202   Recent Labs    10/24/17 0542  NA 138  K 3.8  CL 103  GLUCOSE 123*  BUN 13  CREATININE 0.98  CALCIUM 7.8*   CBG (last 3)  No results for input(s): GLUCAP in the last 72 hours.  Wt Readings from Last 3 Encounters:  10/23/17 106.2 kg (234 lb 2.1 oz)  10/18/17 98 kg (216 lb 0.8 oz)  10/11/17 98 kg (216 lb 1.6 oz)    Physical Exam:  Constitutional: He isoriented to person, place, and time. He appearswell-developedand well-nourished.No distress. Found patient with 3 pills lying on the collar of his gown while lying in bed HENT:  Head:Normocephalicand atraumatic.  Eyes:Conjunctivaeand EOMare normal. Pupils are equal, round, and reactive to light.  Neck:Normal range of motion.Neck supple.  Cardiovascular:Normal rateand regular rhythm. Exam revealsno friction rub. No murmurheard. Respiratory:Effort normaland breath sounds normal. Nostridor. Norespiratory distress. He hasno wheezes.  IE:PPIR.Bowel sounds are normal. He exhibitsno distension. There isno tenderness.  Musculoskeletal: He exhibits noedemaor tenderness.  Neurological: He isalertand oriented to person, place, and time. Patient oriented to self and place as well as reason he is here. Can tell me the month. Decreased insight and awareness overall however. Delays in processing. Upper extremity strength  grossly 4+ out of 5 proximal and distal. Lower extremity 2 out of 5 hip flexion knee flexion/extension and 4 out of 5 ankle dorsiflexion plantarflexion with pain component in the lower limbs. No gross sensory findings noted in the limbs and he can sense both light touch and pain. GU: Scrotum the size of a softball+ with some associated bruising noted.  Generally nontender with manipulation.  No abnormal warmth Skin: Skin iswarmand dry. He isnot diaphoretic. Left back, flank incisions with honeycomb dressing--mild serous drainage noted along the back incision.   Psychiatric: He has anormal mood and affect. Hisbehavior is normal.Judgmentand thought contentnormal    Assessment/Plan: 1.  Functional deficits secondary to lumbar scoliosis with stenosis and radiculopathy which require 3+ hours per day of interdisciplinary therapy in a comprehensive inpatient rehab setting. Physiatrist is providing close team supervision and 24 hour management of active medical problems listed below. Physiatrist and rehab team continue to assess barriers to discharge/monitor patient progress toward functional and medical goals.  Function:  Bathing Bathing position      Bathing parts      Bathing assist        Upper Body Dressing/Undressing Upper body dressing                    Upper body assist        Lower Body Dressing/Undressing Lower body dressing                                  Lower body assist  Toileting Toileting          Toileting assist     Transfers Chair/bed Clinical biochemist          Cognition Comprehension Comprehension assist level: Follows complex conversation/direction with no assist  Expression Expression assist level: Expresses complex ideas: With no assist  Social Interaction Social Interaction assist level: Interacts appropriately with others - No medications needed.   Problem Solving Problem solving assist level: Solves complex problems: Recognizes & self-corrects  Memory Memory assist level: Complete Independence: No helper   Medical Problem List and Plan: 1.Functional mobility deficitssecondary to lumbar scoliosis with radiculopathy status post lumbar decompression and fusion with postoperative encephalopathy -Beginning therapies today 2. DVT Prophylaxis/Anticoagulation: Mechanical:Sequential compression devices, below kneeBilateral lower extremities 3. Pain Management:Oxycodone prn effective. Continue Neurontin for neuropathy. 4. Mood:LCSW to follow for evaluation and support. 5. Neuropsych: This patientiscapable of making decisions on hisown behalf. 6. Skin/Wound Care:monitor incisions for healing. 7. Fluids/Electrolytes/Nutrition:Encourage p.o. intake.  Labs fairly unremarkable except for low albumin.  We will add protein supplement  8. FYT:[id number] BP bid--continue Norvasc and HCTZ daily 9. NOT:[id number] consistent use of CPAP. 10. ABLA: Hemoglobin down to 8.3 today from 9.4 on 10/20/2017   -No obvious signs of acute blood loss   -Iron supplement, recheck CBC tomorrow 11. Scrotal edema: Likely related to anterior incision and associated bruising and postoperative drainage.  Generally the scrotum is nontender.  Elevate as possible and observe for now.  May take some time to resolve given the associated blood   LOS (Days) 1 A FACE TO [place] T, MD 10/24/2017 7:51 AM

## 2017-10-24 NOTE — Progress Notes (Signed)
Pt wears home CPAP.   

## 2017-10-24 NOTE — Evaluation (Signed)
Physical Therapy Assessment and Plan  Patient Details  Name: Bobby [name] MRN: [id number] Date of Birth: 07/30/1956  PT Diagnosis: Difficulty walking, Low back pain and Muscle weakness Rehab Potential: Good ELOS: 10-12 days   Today's Date: 10/24/2017 PT Individual Time: 0800-0900 PT Individual Time Calculation (min): 60 min    Problem List:  Patient Active Problem List   Diagnosis Date Noted  . Scoliosis of lumbar region due to degenerative disease of spine in adult 10/23/2017  . Scoliosis of thoracolumbar spine 10/18/2017  . Hypertension, essential 08/30/2017  . Neck pain 01/08/2017  . Osteoarthritis of spine with radiculopathy, lumbar region 02/19/2016  . Foot drop, left 11/29/2015  . Encounter for general adult medical examination with abnormal findings 11/29/2015    Past Medical History:  Past Medical History:  Diagnosis Date  . Elevated liver enzymes   . GERD (gastroesophageal reflux disease)   . Heart murmur    as a child only; outgrew it  . History of kidney stones    last attack was in 2016  . Hypercholesterolemia   . Hypertension   . Osteoarthritis    spine  . Radiculopathy   . Scoliosis   . Sleep apnea    does not wear CPAP since weight loss  . Vitamin D deficiency   . Wears glasses    Past Surgical History:  Past Surgical History:  Procedure Laterality Date  . ABDOMINAL EXPOSURE N/A 10/18/2017   Procedure: ABDOMINAL EXPOSURE;  Surgeon: Rosetta Posner, MD;  Location: [facility];  Service: Vascular;  Laterality: N/A;  Part-1  . ANTERIOR FUSION CERVICAL SPINE  2002   . ANTERIOR LATERAL LUMBAR FUSION 4 LEVELS Left 10/18/2017   Procedure: Left Lumbar One-Two,Lumbar Two-Three,Lumbar Three-Four Anterolateral lumbar interbody fusion;  Surgeon: Erline Levine, MD;  Location: [facility];  Service: Neurosurgery;  Laterality: Left;  anterolateral approach Part-2  . ANTERIOR LUMBAR FUSION N/A 10/18/2017   Procedure: Lumbar Five-Sacral One Anterior lumbar interbody fusion with  Dr. Curt Jews;  Surgeon: Erline Levine, MD;  Location: [facility];  Service: Neurosurgery;  Laterality: N/A;  Part-1 Abdominal approach  . APPLICATION OF INTRAOPERATIVE CT SCAN N/A 10/19/2017   Procedure: APPLICATION OF INTRAOPERATIVE CT SCAN;  Surgeon: Erline Levine, MD;  Location: [facility];  Service: Neurosurgery;  Laterality: N/A;  APPLICATION OF INTRAOPERATIVE CT SCAN  . BACK SURGERY    . COLONOSCOPY W/ BIOPSIES AND POLYPECTOMY    . LUMBAR LAMINECTOMY/DECOMPRESSION MICRODISCECTOMY Left 02/19/2016   Procedure: Laminectomy and Foraminotomy -Left Lumbar three-four, Lumbar four-five, Lumbar five-Sacral one ;  Surgeon: Ashok Pall, MD;  Location: [facility];  Service: Neurosurgery;  Laterality: Left;  . POSTERIOR LUMBAR FUSION 4 LEVEL N/A 10/19/2017   Procedure: Thoracic Eight to Pelvis fixation and pedicle screws  Transforaminal Lumbar Interbody Fusion Lumbar Four-Five with possible osteotomy;  Surgeon: Erline Levine, MD;  Location: [facility];  Service: Neurosurgery;  Laterality: N/A;  Thoracic Eight to Pelvis fixation and pedicle screws  Transforaminal Lumbar Interbody Fusion Lumbar Four-Five with possible osteotomy     Assessment & Plan Clinical Impression: Patient is a 62 y.o. year old male with history of HTN, OSA, back pain with numbness LUE and left arm due to scoliosis due to DDD. He was admitted on 10/18/17 for two part anterolateral lumbar fusion from L5-S1 by Dr. Vertell Limber. Post op with lethargy, confusion and difficulty following simple commands to complete ADLS.He hashad incontinence of bowel and bladder per reports.Pain control improving.CIR recommended due to functional decline.   Patient transferred  to CIR on 10/23/2017 .   Patient currently requires mod with mobility secondary to muscle weakness, decreased cardiorespiratoy endurance and decreased sitting balance, decreased standing balance and decreased balance strategies.  Prior to hospitalization, patient was independent  with mobility and  lived with Spouse in a House home.  Home access is 3Stairs to enter.  Patient will benefit from skilled PT intervention to maximize safe functional mobility, minimize fall risk and decrease caregiver burden for planned discharge intermittent supervision.  Anticipate patient will benefit from follow up [place] at discharge.  PT - End of Session Activity Tolerance: Improving;Tolerates 10 - 20 min activity with multiple rests PT Assessment Rehab Potential (ACUTE/IP ONLY): Good PT Patient demonstrates impairments in the following area(s): Balance;Behavior;Endurance;Motor;Pain;Sensory;Skin Integrity PT Transfers Functional Problem(s): Bed Mobility;Bed to Chair;Car;Furniture;Floor PT Locomotion Functional Problem(s): Ambulation;Wheelchair Mobility;Stairs PT Plan PT Intensity: Minimum of 1-2 x/day ,45 to 90 minutes PT Frequency: 5 out of 7 days PT Duration Estimated Length of Stay: 10-12 days PT Treatment/Interventions: Ambulation/gait training;Pain management;Stair training;Balance/vestibular training;DME/adaptive equipment instruction;Patient/family education;Therapeutic Activities;Wheelchair propulsion/positioning;Cognitive remediation/compensation;Therapeutic Exercise;Community reintegration;Functional mobility training;Skin care/wound management;UE/LE Strength taining/ROM;Discharge planning;Neuromuscular re-education;Splinting/orthotics;UE/LE Coordination activities PT Transfers Anticipated Outcome(s): supervision PT Locomotion Anticipated Outcome(s): supervision PT Recommendation Follow Up Recommendations: Home health PT Patient destination: Home Equipment Recommended: To be determined  Skilled Therapeutic Intervention Evaluation completed (see details above and below) with education on PT POC and goals and individual treatment initiated with focus on functional mobility, education on back precautions, and use of RW for gait and transfers. Pt transferred from supine>sitting EOB with verbal cues for  techniques and precautions, mod assist to help lift LEs. Pt performed sit<>stand with min assist, verbal cues for techniques. Pt performed stand pivot transfer from bed>w/c with min assist using RW. Pt reports pain 7/10 this session in low back and in L LE. Pt ambulated x 52 ft this session with RW and min assist, limited by fatigue and pain, no evidence of knee buckling this session. Pt ascended/descended one step using B handrails and with mod assist, verbal cues for techniques. Pt performed stand pivot car transfer with RW and min assist. Pt transferred from w/c>recliner at end of session, stand pivot with RW and min assist. Pt left seated in recliner with needs in reach.    PT Evaluation Precautions/Restrictions Precautions Precautions: Back;Fall Precaution Booklet Issued: No Precaution Comments: Pt able to recall 3/3 back precautions Required Braces or Orthoses: Spinal Brace Spinal Brace: Thoracolumbosacral orthotic;Applied in sitting position Restrictions Weight Bearing Restrictions: No Pain Pain Assessment Pain Assessment: 0-10 Pain Score: 8  Pain Type: Surgical pain Pain Location: Back Pain Orientation: Lower Pain Descriptors / Indicators: Aching Pain Frequency: Intermittent Pain Onset: On-going Pain Intervention(s): Medication (See eMAR) Home Living/Prior Functioning Home Living Available Help at Discharge: Family;Available 24 hours/day Type of Home: House Home Access: Stairs to enter [employer] of Steps: 3 Entrance Stairs-Rails: Right;Left;Can reach both Home Layout: One level  Lives With: Spouse Prior Function Level of Independence: Independent with basic ADLs;Independent with gait  Able to Take Stairs?: Yes Driving: Yes Vocation: Full time employment Vocation Requirements: lab tech Leisure: Hobbies-yes (Comment) Comments: yard work Hotel manager Status: Impaired/Different from baseline Arousal/Alertness: Awake/alert Orientation Level:  Oriented X4 Attention: Sustained Sustained Attention: Appears intact Memory: Impaired Memory Impairment: Decreased recall of new information Problem Solving: Appears intact Safety/Judgment: Appears intact Sensation Sensation Light Touch: Appears Intact(pt reports some numbness/tingling L LE) Stereognosis: Not tested Hot/Cold: Appears Intact Proprioception: Appears Intact Coordination Gross Motor Movements are Fluid and Coordinated: No Fine  Motor Movements are Fluid and Coordinated: Yes Coordination and Movement Description: some incoordination noted with L LE during functional movements Motor  Motor Motor: Within Functional Limits Motor - Skilled Clinical Observations: generalized weakness  Trunk/Postural Assessment  Cervical Assessment Cervical Assessment: Within Functional Limits Thoracic Assessment Thoracic Assessment: Within Functional Limits Lumbar Assessment Lumbar Assessment: Exceptions to WFL(spinal fusion) Postural Control Postural Control: Within Functional Limits  Balance Balance Balance Assessed: Yes Static Sitting Balance Static Sitting - Level of Assistance: 5: Stand by assistance Dynamic Sitting Balance Dynamic Sitting - Level of Assistance: 4: Min assist Sitting balance - Comments: Bathing UB on TTB Static Standing Balance Static Standing - Level of Assistance: 4: Min assist Dynamic Standing Balance Dynamic Standing - Level of Assistance: 4: Min assist Extremity Assessment  RLE Assessment RLE Assessment: Within Functional Limits LLE Assessment LLE Assessment: Exceptions to WFL(weakness compared to R, grossly 4/5 throughout)   See Function Navigator for Current Functional Status.   Refer to Care Plan for Long Term Goals  Recommendations for other services: None   Discharge Criteria: Patient will be discharged from PT if patient refuses treatment 3 consecutive times without medical reason, if treatment goals not met, if there is a change in  medical status, if patient makes no progress towards goals or if patient is discharged from hospital.  The above assessment, treatment plan, treatment alternatives and goals were discussed and mutually agreed upon: by patient  Netta Corrigan, PT, DPT 10/24/2017, 9:04 AM

## 2017-10-25 ENCOUNTER — Inpatient Hospital Stay (HOSPITAL_COMMUNITY): Payer: 59 | Admitting: Occupational Therapy

## 2017-10-25 ENCOUNTER — Inpatient Hospital Stay (HOSPITAL_COMMUNITY): Payer: 59 | Admitting: Physical Therapy

## 2017-10-25 ENCOUNTER — Encounter (HOSPITAL_COMMUNITY): Payer: Self-pay | Admitting: *Deleted

## 2017-10-25 DIAGNOSIS — E8809 Other disorders of plasma-protein metabolism, not elsewhere classified: Secondary | ICD-10-CM

## 2017-10-25 DIAGNOSIS — D62 Acute posthemorrhagic anemia: Secondary | ICD-10-CM

## 2017-10-25 DIAGNOSIS — G8918 Other acute postprocedural pain: Secondary | ICD-10-CM

## 2017-10-25 DIAGNOSIS — K5903 Drug induced constipation: Secondary | ICD-10-CM

## 2017-10-25 DIAGNOSIS — E46 Unspecified protein-calorie malnutrition: Secondary | ICD-10-CM

## 2017-10-25 DIAGNOSIS — N5089 Other specified disorders of the male genital organs: Secondary | ICD-10-CM

## 2017-10-25 DIAGNOSIS — I10 Essential (primary) hypertension: Secondary | ICD-10-CM

## 2017-10-25 LAB — CBC
HEMATOCRIT: 25.3 % — AB (ref 39.0–52.0)
HEMOGLOBIN: 8.1 g/dL — AB (ref 13.0–17.0)
MCH: 28.4 pg (ref 26.0–34.0)
MCHC: 32 g/dL (ref 30.0–36.0)
MCV: 88.8 fL (ref 78.0–100.0)
Platelets: 197 10*3/uL (ref 150–400)
RBC: 2.85 MIL/uL — AB (ref 4.22–5.81)
RDW: 14.5 % (ref 11.5–15.5)
WBC: 10 10*3/uL (ref 4.0–10.5)

## 2017-10-25 MED ORDER — POLYETHYLENE GLYCOL 3350 17 G PO PACK
17.0000 g | PACK | Freq: Every day | ORAL | Status: DC
  Administered 2017-10-25 – 2017-10-28 (×4): 17 g via ORAL
  Filled 2017-10-25 (×5): qty 1

## 2017-10-25 NOTE — Progress Notes (Signed)
Physical Therapy Session Note  Patient Details  Name: Bobby [name] MRN: [id number] Date of Birth: 1956-01-12  Today's Date: 10/25/2017 PT Individual Time: 1000-1115 PT Individual Time Calculation (min): 75 min   Short Term Goals: Week 1:  PT Short Term Goal 1 (Week 1): Pt will perform all bed mobility with supervision while maintaining back precautions PT Short Term Goal 2 (Week 1): Pt will ascend/descend 4 steps with min assist using handrails as needed PT Short Term Goal 3 (Week 1): Pt will ambulate 150 ft with min assist using LRAD  Skilled Therapeutic Interventions/Progress Updates:    c/o 7-9/10 pain throughout session, reports being pre-medicated.  Rest breaks provided as needed for pain and fatigue throughout session.  Focus on activity tolerance and strengthening with functional mobility and therex.    Pt transfers throughout session with RW and supervision, verbal cues for scooting back onto sitting surface rather than leaning backwards.  Sit<>supine on flat therapy mat with supervision, min verbal cues for back precautions.    Pt completes 2x6-10 reps of marching in hooklying position and SAQ focus on core activation, breathing, and LE strengthening.  Pt completes 2x15 reps bicep curls, anterior shoulder flexion, and lateral shoulder flexion with 3# weights focus on posture, core activation, and strengthening.  Nustep x8 minutes at level 3 for activity tolerance, verbal cues to maintain steps/minute >35.    Gait training x100' +250' (start and end of session) with RW and min guard with occasional verbal cues for maintaining walker positioning, upright posture, and relaxing shoulders.  Pt returned to room at end of session, positioned in recliner with call bell in reach and needs met.   Therapy Documentation Precautions:  Precautions Precautions: Back, Fall Precaution Booklet Issued: No Precaution Comments: Pt able to recall 3/3 back precautions Required Braces or Orthoses:  Spinal Brace Spinal Brace: Thoracolumbosacral orthotic, Applied in sitting position Restrictions Weight Bearing Restrictions: No   See Function Navigator for Current Functional Status.   Therapy/Group: Individual Therapy  Michel Santee 10/25/2017, 11:19 AM

## 2017-10-25 NOTE — Progress Notes (Addendum)
[facility] PHYSICAL MEDICINE & REHABILITATION     PROGRESS NOTE    Subjective/Complaints: Pt seen lying in bed this AM.  He slept well overnight.  He notes he had a good first day of therapies yesterday. He does note constipation.   ROS: +Constipation. Denies nausea, vomiting, diarrhea, shortness of breath or chest pain   Objective: Vital Signs: Blood pressure 130/82, pulse 92, temperature 98.8 F (37.1 C), temperature source Oral, resp. rate 20, height 5\' 3"  (1.6 m), weight 106.2 kg (234 lb 2.1 oz), SpO2 98 %. No results found. Recent Labs    10/24/17 0542 10/25/17 0538  WBC 10.9* 10.0  HGB 8.3* 8.1*  HCT 25.8* 25.3*  PLT 202 197   Recent Labs    10/24/17 0542  NA 138  K 3.8  CL 103  GLUCOSE 123*  BUN 13  CREATININE 0.98  CALCIUM 7.8*   CBG (last 3)  No results for input(s): GLUCAP in the last 72 hours.  Wt Readings from Last 3 Encounters:  10/23/17 106.2 kg (234 lb 2.1 oz)  10/18/17 98 kg (216 lb 0.8 oz)  10/11/17 98 kg (216 lb 1.6 oz)    Physical Exam:  Constitutional: He appearswell-developedand well-nourished.No distress. HENT: Normocephalicand atraumatic.  Eyes:EOMare normal. No discharge.  Cardiovascular:Normal rateand regular rhythm.  Respiratory:Effort normaland breath sounds normal.  GI: Bowel sounds are normal. He exhibitsno distension.  Musculoskeletal: LE edema. No tenderness.  Neurological: He isalertand oriented. Decreased insight and awareness.  Delays in processing.  Motor: B/l Upper extremity strength grossly 5/5 proximal to distal.  B/l LE HF 3/5, 4+/5 KE, 5/5 ADF/PF Sensation diminished to light touch left foot Skin: Skin iswarmand dry. He isnot diaphoretic.Incision c/d/i. Psychiatric: He has anormal mood and affect. Hisbehavior is normal.Judgmentand thought contentnormal  Assessment/Plan: 1.  Functional deficits secondary to lumbar scoliosis with stenosis and radiculopathy which require 3+ hours per day of  interdisciplinary therapy in a comprehensive inpatient rehab setting. Physiatrist is providing close team supervision and 24 hour management of active medical problems listed below. Physiatrist and rehab team continue to assess barriers to discharge/monitor patient progress toward functional and medical goals.  Function:  Bathing Bathing position   Position: Shower  Bathing parts Body parts bathed by patient: Right lower leg, Right arm, Left arm, Chest, Abdomen, Front perineal area, Right upper leg, Left upper leg Body parts bathed by helper: Buttocks, Right lower leg, Left lower leg, Back  Bathing assist Assist Level: (Mod A)      Upper Body Dressing/Undressing Upper body dressing   What is the patient wearing?: Pull over shirt/dress, Hospital gown     Pull over shirt/dress - Perfomed by patient: Thread/unthread right sleeve, Thread/unthread left sleeve, Put head through opening, Pull shirt over trunk          Upper body assist Assist Level: Supervision or verbal cues      Lower Body Dressing/Undressing Lower body dressing   What is the patient wearing?: Pants, Non-skid slipper socks       Pants- Performed by helper: Thread/unthread right pants leg, Thread/unthread left pants leg, Pull pants up/down   Non-skid slipper socks- Performed by helper: Don/doff right sock, Don/doff left sock                  Lower body assist        Toileting Toileting Toileting activity did not occur: No continent bowel/bladder event Toileting steps completed by patient: Adjust clothing prior to toileting, Adjust clothing after toileting, Performs perineal  hygiene   Toileting Assistive Devices: Grab bar or rail  Toileting assist Assist level: Touching or steadying assistance (Pt.75%)   Transfers Chair/bed transfer   Chair/bed transfer method: Stand pivot Chair/bed transfer assist level: Touching or steadying assistance (Pt > 75%) Chair/bed transfer assistive device: Environmental health practitioner     Max distance: 52 ft Assist level: Touching or steadying assistance (Pt > 75%)   Wheelchair   Type: Manual Max wheelchair distance: 150 ft Assist Level: Touching or steadying assistance (Pt > 75%)  Cognition Comprehension Comprehension assist level: Understands complex 90% of the time/cues 10% of the time  Expression Expression assist level: Expresses complex 90% of the time/cues < 10% of the time  Social Interaction Social Interaction assist level: Interacts appropriately with others - No medications needed.  Problem Solving Problem solving assist level: Solves complex problems: With extra time  Memory Memory assist level: Recognizes or recalls 90% of the time/requires cueing < 10% of the time   Medical Problem List and Plan: 1.Functional mobility deficitssecondary to lumbar scoliosis with radiculopathy status post lumbar decompression and fusion with postoperative encephalopathy   Cont therapies   Notes reviewed, labs reviewed 2. DVT Prophylaxis/Anticoagulation: Mechanical:Sequential compression devices, below kneeBilateral lower extremities 3. Pain Management:Oxycodone prn effective. Continue Neurontin for neuropathy. 4. Mood:LCSW to follow for evaluation and support. 5. Neuropsych: This patientiscapable of making decisions on hisown behalf. 6. Skin/Wound Care:monitor incisions for healing. 7. Fluids/Electrolytes/Nutrition:Encourage p.o. intake.   8. DDU:KGURKYH BP bid--continue Norvasc and HCTZ daily   Relatively controlled on 1/21 9. CWC:[id number] consistent use of CPAP. 10. ABLA:    Hemoglobin 8.1 on 1/21   Cont to monitor 11. Scrotal edema: Likely related to anterior incision and associated bruising and postoperative drainage.  Generally the scrotum is nontender.  Elevate as possible and observe for now.   12. Hypoalbuminemia   Supplement initiated  13. Constipation    Bowel meds increased on 1/21   LOS (Days) 2 A FACE TO  FACE EVALUATION WAS PERFORMED  Bobby [name] Lorie Phenix, MD 10/25/2017 8:07 AM

## 2017-10-25 NOTE — IPOC Note (Signed)
Overall Plan of Care [facility]) Patient Details Name: Bobby [name] MRN: [id number] DOB: Dec 02, 1955  Admitting Diagnosis: <principal problem not specified>  Hospital Problems: Active Problems:   Scoliosis of lumbar region due to degenerative disease of spine in adult   Benign essential HTN   Acute blood loss anemia   Scrotal edema   Hypoalbuminemia due to protein-calorie malnutrition (HCC)   Post-operative pain     Functional Problem List: Nursing Bowel, Edema, Endurance, Pain, Safety, Skin Integrity  PT Balance, Behavior, Endurance, Motor, Pain, Sensory, Skin Integrity  OT Balance, Pain, Cognition, Safety, Endurance, Skin Integrity, Motor  SLP    TR         Basic ADL's: OT Grooming, Bathing, Dressing, Toileting     Advanced  ADL's: OT Simple Meal Preparation     Transfers: PT Bed Mobility, Bed to Chair, Car, [employer], Futures trader, Metallurgist: PT Ambulation, Emergency planning/management officer, Stairs     Additional Impairments: OT None  SLP        TR      Anticipated Outcomes Item Anticipated Outcome  Self Feeding No goal  Swallowing      Basic self-care  Supervision/setup-Mod I  Toileting  Mod I   Bathroom Transfers Supervision/setup-Mod I  Bowel/Bladder  remain continent of bowel. Mod I  Transfers  supervision  Locomotion  supervision  Communication     Cognition     Pain  less than 4  Safety/Judgment  Patient will remain free of falls, infection and skin breakdown with min assist.   Therapy Plan: PT Intensity: Minimum of 1-2 x/day ,45 to 90 minutes PT Frequency: 5 out of 7 days PT Duration Estimated Length of Stay: 10-12 days OT Intensity: Minimum of 1-2 x/day, 45 to 90 minutes OT Frequency: 5 out of 7 days OT Duration/Estimated Length of Stay: 8-10      Team Interventions: Nursing Interventions Bowel Management, Patient/Family Education, Pain Management, Skin Care/Wound Management, Discharge Planning  PT interventions  Ambulation/gait training, Pain management, Stair training, Training and development officer, DME/adaptive equipment instruction, Patient/family education, Therapeutic Activities, Wheelchair propulsion/positioning, Cognitive remediation/compensation, Therapeutic Exercise, Community reintegration, Functional mobility training, Skin care/wound management, UE/LE Strength taining/ROM, Discharge planning, Neuromuscular re-education, Splinting/orthotics, UE/LE Coordination activities  OT Interventions Balance/vestibular training, Discharge planning, Functional electrical stimulation, Pain management, Self Care/advanced ADL retraining, Therapeutic Activities, UE/LE Coordination activities, Visual/perceptual remediation/compensation, Therapeutic Exercise, Skin care/wound managment, Patient/family education, Functional mobility training, Disease mangement/prevention, Cognitive remediation/compensation, Community reintegration, Neuromuscular re-education, DME/adaptive equipment instruction, Psychosocial support, Splinting/orthotics, UE/LE Strength taining/ROM, Wheelchair propulsion/positioning  SLP Interventions    TR Interventions    SW/CM Interventions Discharge Planning, Psychosocial Support, Patient/Family Education   Barriers to Discharge MD  Medical stability  Nursing      PT      OT Medical stability    SLP      SW       Team Discharge Planning: Destination: PT-Home ,OT- Home , SLP-  Projected Follow-up: PT-Home health PT, OT-  Home health OT, SLP-  Projected Equipment Needs: PT-To be determined, OT- Tub/shower bench, 3 in 1 bedside comode, SLP-  Equipment Details: PT- , OT-  Patient/family involved in discharge planning: PT- Patient,  OT-Patient, SLP-   MD ELOS: 9-12 days. Medical Rehab Prognosis:  Good  Assessment: 62 y.o.malewith history of HTN, OSA, back pain with numbness LUE due to scoliosis due to DDD. He was admitted on 10/18/17 for two part anterolateral lumbar fusion from L5-S1 by Dr.  Vertell Limber. Post op with  lethargy, confusion and difficulty following simple commands to complete ADLs.Pain control improving.Patient with resulting deficits with mobility, transfers, self-care.  Will set goals for Supervision/Mod I with PT/OT.   See Team Conference Notes for weekly updates to the plan of care

## 2017-10-25 NOTE — Progress Notes (Signed)
Social Work Assessment and Plan  Patient Details  Name: Bobby [name] MRN: [id number] Date of Birth: [birth year]  Today's Date: 10/25/2017  Problem List:  Patient Active Problem List   Diagnosis Date Noted  . Benign essential HTN   . Acute blood loss anemia   . Scrotal edema   . Hypoalbuminemia due to protein-calorie malnutrition ([place])   . Post-operative pain   . Drug-induced constipation   . Scoliosis of lumbar region due to degenerative disease of spine in adult 10/23/2017  . Scoliosis of thoracolumbar spine 10/18/2017  . Hypertension, essential 08/30/2017  . Neck pain 01/08/2017  . Osteoarthritis of spine with radiculopathy, lumbar region 02/19/2016  . Foot drop, left 11/29/2015  . Encounter for general adult medical examination with abnormal findings 11/29/2015   Past Medical History:  Past Medical History:  Diagnosis Date  . Elevated liver enzymes   . GERD (gastroesophageal reflux disease)   . Heart murmur    as a child only; outgrew it  . History of kidney stones    last attack was in 2016  . Hypercholesterolemia   . Hypertension   . Osteoarthritis    spine  . Radiculopathy   . Scoliosis   . Sleep apnea    does not wear CPAP since weight loss  . Vitamin D deficiency   . Wears glasses    Past Surgical History:  Past Surgical History:  Procedure Laterality Date  . ABDOMINAL EXPOSURE N/A 10/18/2017   Procedure: ABDOMINAL EXPOSURE;  Surgeon: Rosetta Posner, MD;  Location: [facility];  Service: Vascular;  Laterality: N/A;  Part-1  . ANTERIOR FUSION CERVICAL SPINE  2002   . ANTERIOR LATERAL LUMBAR FUSION 4 LEVELS Left 10/18/2017   Procedure: Left Lumbar One-Two,Lumbar Two-Three,Lumbar Three-Four Anterolateral lumbar interbody fusion;  Surgeon: Erline Levine, MD;  Location: [facility];  Service: Neurosurgery;  Laterality: Left;  anterolateral approach Part-2  . ANTERIOR LUMBAR FUSION N/A 10/18/2017   Procedure: Lumbar Five-Sacral One Anterior lumbar interbody fusion with Dr.  Curt Jews;  Surgeon: Erline Levine, MD;  Location: [facility];  Service: Neurosurgery;  Laterality: N/A;  Part-1 Abdominal approach  . APPLICATION OF INTRAOPERATIVE CT SCAN N/A 10/19/2017   Procedure: APPLICATION OF INTRAOPERATIVE CT SCAN;  Surgeon: Erline Levine, MD;  Location: [facility];  Service: Neurosurgery;  Laterality: N/A;  APPLICATION OF INTRAOPERATIVE CT SCAN  . BACK SURGERY    . COLONOSCOPY W/ BIOPSIES AND POLYPECTOMY    . LUMBAR LAMINECTOMY/DECOMPRESSION MICRODISCECTOMY Left 02/19/2016   Procedure: Laminectomy and Foraminotomy -Left Lumbar three-four, Lumbar four-five, Lumbar five-Sacral one ;  Surgeon: Ashok Pall, MD;  Location: [facility];  Service: Neurosurgery;  Laterality: Left;  . POSTERIOR LUMBAR FUSION 4 LEVEL N/A 10/19/2017   Procedure: Thoracic Eight to Pelvis fixation and pedicle screws  Transforaminal Lumbar Interbody Fusion Lumbar Four-Five with possible osteotomy;  Surgeon: Erline Levine, MD;  Location: [facility];  Service: Neurosurgery;  Laterality: N/A;  Thoracic Eight to Pelvis fixation and pedicle screws  Transforaminal Lumbar Interbody Fusion Lumbar Four-Five with possible osteotomy    Social History:  reports that he quit smoking about 10 years ago. His smoking use included cigarettes and cigars. He has a 15.00 pack-year smoking history. he has never used smokeless tobacco. He reports that he drinks about 0.6 oz of alcohol per week. He reports that he does not use drugs.  Family / Support Systems Marital Status: Married How Long?: 25 years Patient Roles: Spouse Spouse/Significant Other: Bobby [name] - wife - [phone]  Anticipated Caregiver: Wife  Ability/Limitations of Caregiver: She can assist when she is home, but she works nights. Caregiver Availability: Intermittent(Pt's wife works at night at a Quarry manager.) Family Dynamics: supportive relationship with wife.  They really do not have any other family support.  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  He has short term disability through work. Legal History/Current Legal Issues: none reported Guardian/Conservator: N/A - MD has determined that pt is capable of making his own decisions.   Abuse/Neglect Abuse/Neglect Assessment Can Be Completed: Yes Physical Abuse: Denies Verbal Abuse: Denies Sexual Abuse: Denies Exploitation of patient/patient's resources: Denies Self-Neglect: Denies  Emotional Status Pt's affect, behavior and adjustment status: Pt reports feeling well emotionally.  Hiccups and nausea are frustrating him slightly.  He knew this surgery was pending, so he feels he had time to prepare for this recovery. Recent Psychosocial Issues: none reported Psychiatric History: none reported Substance Abuse History: none reported  Patient / Family Perceptions, Expectations & Goals Pt/Family understanding of illness & functional limitations: Pt has a good understanding of his functional status and limitations. Premorbid pt/family roles/activities: Pt works, enjoys working in his yard and playing with his two small dogs. Anticipated changes in roles/activities/participation: Pt wants to resume his activities as he is able and [name] to be back at work in a couple of months. Pt/family expectations/goals: Pt would like to be going home by the end of the week and wants to be as independent as possible in caring for himself.  Community Resources [employer] Scripts: None Premorbid Home Care/DME Agencies: None Transportation available at discharge: wife   Discharge Planning Living Arrangements: Spouse/significant other Support Systems: Spouse/significant other Type of Residence: Private residence Administrator, sports: Multimedia programmer (specify)([employer]) Museum/gallery curator Resources:  Employment, Secondary school teacher Screen Referred: No Money Management: Patient, Spouse Does the patient have any problems obtaining your medications?: No Home Management: Pt and wife share this.  Pt's wife can manage while pt is recovering.  Pt is not concerned about finances while he is out of work. Patient/Family Preliminary Plans: Pt plans to return home with his wife to assist him.  She will take a little time off, but will then return to working nights.  Pt will have to arrange his needs/schedule around hers, as he will not have 24/7 supervision once she returns to work. Social Work Anticipated Follow Up Needs: HH/OP Expected length of stay: 8 to 12 days  Clinical Impression CSW met with pt to introduce self and role of CSW, as well as to complete assessment.  Pt is motivated to rehab and wants to get home by week's end.  His wife will take some time off, but plans to return to her night shift lab tech job soon.  Pt knows he will not be able to do everything while she is gone as we are recommending supervision for some tasks.  Pt with nausea and hiccups which are aggravating him, but remains in good spirits.  Reports rehab is going well so far and he is pleased with what he can do already.  CSW will remain available to assist as needed and will meet pt's wife.  Dakarai Mcglocklin, Silvestre Mesi 10/25/2017, 1:01 PM

## 2017-10-25 NOTE — Progress Notes (Addendum)
Occupational Therapy Session Note  Patient Details  Name: Bobby [name] MRN: [id number] Date of Birth: April 28, 1956  Today's Date: 10/25/2017 OT Individual Time: 6384-6659 OT Individual Time Calculation (min): 75 min    Short Term Goals: Week 1:  OT Short Term Goal 1 (Week 1): N/A due to ELOS  Skilled Therapeutic Interventions/Progress Updates:    Pt presents supine in bed agreeable to OT tx session. Pt verbalizing 3/3 back precautions. Reports 7/10 pain with RN administering pain meds start of session. Pt completes bed mobility with MinA and verbal cues for using logroll technique. Incision covered and back brace donned sitting EOB; Pt ambulates within room at RW level with MinA to transfer to TTB as well as throughout session. Pt removing gown/back brace seated on TTB, completes bathing from sit<>stand level in shower, using reacher and long handled sponge appropriately during task completion to pick up dropped items and to wash back/lower LEs. Min verbal cues for adhering to back precautions and MinA during sit<>stand portions of task. Pt completes UB dressing seated on TTB, dons overhead shirt with setup assist and back brace with ModA. Pt ambulates to EOB to complete LB dressing using AE. Pt requires MinA for assisting to thread LLE into pantleg, MinA in standing while pt advances over hips, and min verbal cues for using reacher during task. Pt dons socks using sock aide with minA for placement of L foot in AD. Pt transfers to recliner end of session with MinA using RW; requires verbal cues for hand placement during sit<>stand. Pt left sitting in recliner, call bell and needs within reach; RN present.   Therapy Documentation Precautions:  Precautions Precautions: Back, Fall Precaution Booklet Issued: No Precaution Comments: Pt able to recall 3/3 back precautions Required Braces or Orthoses: Spinal Brace Spinal Brace: Thoracolumbosacral orthotic, Applied in sitting  position Restrictions Weight Bearing Restrictions: No    Pain: Pain Assessment Pain Assessment: 0-10 Pain Score: 7  Pain Type: Surgical pain Pain Intervention(s): Medication (See eMAR) ADL: ADL ADL Comments: Please see functional navigator for ADL status  See Function Navigator for Current Functional Status.   Therapy/Group: Individual Therapy  Raymondo Band 10/25/2017, 9:38 AM

## 2017-10-25 NOTE — Progress Notes (Signed)
Inpatient [facility] Individual Statement of Services  Patient Name:  Bobby [name]  Date:  10/25/2017  Welcome to the [facility].  Our goal is to provide you with an individualized program based on your diagnosis and situation, designed to meet your specific needs.  With this comprehensive rehabilitation program, you will be expected to participate in at least 3 hours of rehabilitation therapies Monday-Friday, with modified therapy programming on the weekends.  Your rehabilitation program will include the following services:  Physical Therapy (PT), Occupational Therapy (OT), 24 hour per day rehabilitation nursing, Case Management (Social Worker), Rehabilitation Medicine, Nutrition Services and Pharmacy Services  Weekly team conferences will be held on Wednesdays to discuss your progress.  Your Social Worker will talk with you frequently to get your input and to update you on team discussions.  Team conferences with you and your family in attendance may also be held.  Expected length of stay:  8 to 12 days  Overall anticipated outcome:  Modified independent with supervision needed for bathing, dressing, car transfers, ambulation, stairs  Depending on your progress and recovery, your program may change. Your Social Worker will coordinate services and will keep you informed of any changes. Your Social Worker's name and contact numbers are listed  below.  The following services may also be recommended but are not provided by the [facility] will be made to provide these services after discharge if needed.  Arrangements include referral to agencies that provide these services.  Your insurance has been verified to be:  [facility] primary doctor is:  Dr. [name]  Pertinent information  will be shared with your doctor and your insurance company.  Social Worker:  Alfonse Alpers, LCSW  [phone] or ([phone]  Information discussed with and copy given to patient by: Trey Sailors, 10/25/2017, 12:39 PM

## 2017-10-25 NOTE — Progress Notes (Signed)
Rt went into pt's room to see if he wanted to wear home CPAP unit. Pt stated he could place self on  unit. Rt made pt aware if he needed help to let nurse know and they will let RT know.

## 2017-10-25 NOTE — Progress Notes (Signed)
Patient information reviewed and entered into eRehab system by Lakely Elmendorf, RN, CRRN, PPS Coordinator.  Information including medical coding and functional independence measure will be reviewed and updated through discharge.    

## 2017-10-25 NOTE — Progress Notes (Signed)
Physical Therapy Session Note  Patient Details  Name: Bobby [name] MRN: [id number] Date of Birth: 02/24/1956  Today's Date: 10/25/2017 PT Individual Time: 1300-1340 PT Individual Time Calculation (min): 40 min   Short Term Goals: Week 1:  PT Short Term Goal 1 (Week 1): Pt will perform all bed mobility with supervision while maintaining back precautions PT Short Term Goal 2 (Week 1): Pt will ascend/descend 4 steps with min assist using handrails as needed PT Short Term Goal 3 (Week 1): Pt will ambulate 150 ft with min assist using LRAD  Skilled Therapeutic Interventions/Progress Updates:    c/o 3/10 pain.  Session focus on balance and strength while maintaining back precautions.  Pt requires min cues throughout session to abide by back precautions.    Gait to and from therapy gym with RW and supervision.  Gait on compliant surface x10' with RW and close supervision/steady assist.  Repeated sit<>stand without UE support 2x10 for strengthening with verbal cues for decreased BOS for increased challenge, seated rest break in between.  Standing balance on foam completing pipe tree for cognitive challenge with close supervision.  Horseshoe task for dynamic balance without RW for support and min guard.  Pt retrieved horseshoes from the floor with reacher to focus on maintaining back precautions.  Returned to room at end of session, positioned in recliner with call bell in reach and needs met.   Therapy Documentation Precautions:  Precautions Precautions: Back, Fall Precaution Booklet Issued: No Precaution Comments: Pt able to recall 3/3 back precautions Required Braces or Orthoses: Spinal Brace Spinal Brace: Thoracolumbosacral orthotic, Applied in sitting position Restrictions Weight Bearing Restrictions: No   See Function Navigator for Current Functional Status.   Therapy/Group: Individual Therapy  Michel Santee 10/25/2017, 1:42 PM

## 2017-10-26 ENCOUNTER — Inpatient Hospital Stay (HOSPITAL_COMMUNITY): Payer: 59 | Admitting: Occupational Therapy

## 2017-10-26 ENCOUNTER — Inpatient Hospital Stay (HOSPITAL_COMMUNITY): Payer: 59

## 2017-10-26 ENCOUNTER — Inpatient Hospital Stay (HOSPITAL_COMMUNITY): Payer: 59 | Admitting: Physical Therapy

## 2017-10-26 MED ORDER — TRAMADOL HCL 50 MG PO TABS
50.0000 mg | ORAL_TABLET | Freq: Four times a day (QID) | ORAL | Status: DC | PRN
  Administered 2017-10-26 – 2017-10-29 (×5): 50 mg via ORAL
  Filled 2017-10-26 (×5): qty 1

## 2017-10-26 MED ORDER — TRAMADOL 5 MG/ML ORAL SUSPENSION: 50.0000 mg | Freq: Four times a day (QID) | ORAL | Status: DC | PRN

## 2017-10-26 MED ORDER — OXYCODONE HCL 5 MG PO TABS
5.0000 mg | ORAL_TABLET | ORAL | Status: DC | PRN
  Administered 2017-10-26 – 2017-10-29 (×7): 5 mg via ORAL
  Filled 2017-10-26 (×7): qty 1

## 2017-10-26 NOTE — Progress Notes (Signed)
Physical Therapy Session Note  Patient Details  Name: Bobby [name] MRN: [id number] Date of Birth: 09-17-1956  Today's Date: 10/26/2017 PT Individual Time: 0900-0956 PT Individual Time Calculation (min): 56 min   Short Term Goals: Week 1:  PT Short Term Goal 1 (Week 1): Pt will perform all bed mobility with supervision while maintaining back precautions PT Short Term Goal 2 (Week 1): Pt will ascend/descend 4 steps with min assist using handrails as needed PT Short Term Goal 3 (Week 1): Pt will ambulate 150 ft with min assist using LRAD  Skilled Therapeutic Interventions/Progress Updates:    Pt supine in bed upon PT arrival, agreeable to therapy tx and reports back pain 6/10. P transferred from supine>sitting EOB with supervision, log roll technique to maintain back precautions. Pt's wife present at beginning of session, discussed pts progress and goals for physical therapy. Pt ambulated from room<>gym x 150 ft each direction with supervision and RW. Pt ascended/descended 4 steps using B handrails and min assist, step to pattern. Pt worked on dynamic standing balance without UE support to perform toe taps on 4 inch step with min assist. Pt ambulated to rehab apartment with supervision and RW. Pt transferred to recliner with supervision using RW, pt able to perform sit<>stand from recliner with min assist. Pt performed bed mobility on rehab apartment bed without use of bedrails, min assist and verbal cues for log roll technique. Pt ambulated to ortho gym and performed car transfer height 28 inches with min assist, pt states he has a Journalist, newspaper at home but anticipates it will be difficult to get in/out of so his wife is going to rent a car. Pt ambulated back to room and left seated in recliner with needs in reach.   Therapy Documentation Precautions:  Precautions Precautions: Back, Fall Precaution Booklet Issued: No Precaution Comments: Pt able to recall 3/3 back precautions Required Braces  or Orthoses: Spinal Brace Spinal Brace: Thoracolumbosacral orthotic, Applied in sitting position Restrictions Weight Bearing Restrictions: No   See Function Navigator for Current Functional Status.   Therapy/Group: Individual Therapy  Netta Corrigan, PT, DPT 10/26/2017, 7:48 AM

## 2017-10-26 NOTE — Progress Notes (Signed)
Occupational Therapy Session Note  Patient Details  Name: Bobby [name] MRN: [id number] Date of Birth: 03/29/1956  Today's Date: 10/26/2017 OT Individual Time: 1303-1401 OT Individual Time Calculation (min): 58 min    Short Term Goals: Week 1:  OT Short Term Goal 1 (Week 1): N/A due to ELOS  Skilled Therapeutic Interventions/Progress Updates:    Pt completed grooming tasks at the sink sit to stand with supervision.  He declined wanting to shower since he took one yesterday but did state he wanted to wash his face and brush his teeth.  He was able to complete all grooming tasks with supervision sitting at the sink.  He transitioned to the EOB in order to remove his TLSO and donn a new shirt.  Min instructional cueing for donning TLSO after removal.  Next had pt ambulate down to the tub/shower room for work on tub transfers.  He was able to step over the edge of the tub with min guard assist and mod instructional cueing, to sit on small shower seat.  Recommend small shower seat for use at home as well.  Next had him ambulate to the ortho gym and work on functional activity of tossing and picking up horse shoes with use of the reacher and close supervision.  Finished session with return to the room and pt left in the bedside recliner.    Therapy Documentation Precautions:  Precautions Precautions: Back, Fall Precaution Booklet Issued: No Precaution Comments: Pt able to recall 3/3 back precautions Required Braces or Orthoses: Spinal Brace Spinal Brace: Thoracolumbosacral orthotic, Applied in sitting position Restrictions Weight Bearing Restrictions: No  Pain: Pain Assessment Pain Assessment: 0-10 Pain Score: 7  Faces Pain Scale: Hurts a little bit Pain Type: Surgical pain Pain Location: Back Pain Orientation: Lower Pain Descriptors / Indicators: Aching Pain Onset: Gradual Pain Intervention(s): Medication (See eMAR) ADL: See Function Navigator for Current Functional  Status.   Therapy/Group: Individual Therapy  Sherma Vanmetre OTR/L 10/26/2017, 4:49 PM

## 2017-10-26 NOTE — Progress Notes (Signed)
Physical Therapy Session Note  Patient Details  Name: Bobby [name] MRN: [id number] Date of Birth: June 15, 1956  Today's Date: 10/26/2017 PT Individual Time: 1415-1525 PT Individual Time Calculation (min): 70 min   Short Term Goals: Week 1:  PT Short Term Goal 1 (Week 1): Pt will perform all bed mobility with supervision while maintaining back precautions PT Short Term Goal 2 (Week 1): Pt will ascend/descend 4 steps with min assist using handrails as needed PT Short Term Goal 3 (Week 1): Pt will ambulate 150 ft with min assist using LRAD  Skilled Therapeutic Interventions/Progress Updates:   Pt in recliner and agreeable to therapy, pain 7/10 as detailed below. NT present getting vital signs. Pain remained 7/10 throughout session, occasional seated rest breaks 2/2 pain/fatigue. Ambulated to/from therapy gym w/ supervision using RW to work on functional endurance. Worked on household mobility requiring pt to avoid obstacles, pick up objects from high and low using reacher device, ambulate on compliant surfaces, and step over objects. All tasks performed w/ min guard to close supervision for safety, 1 LOB corrected w/ stepping strategy w/o physical assist from PT. Performed static and dynamic standing tasks w/ unilateral UE support on RW and bouts w/o UE support. Tasks included reaching in anterior and lateral directions within back precautions, pt tolerated 2-3 min of standing at a time w/o UE support. Performed sit<>stands using RW on airex pad to challenge ankle stability w/ functional movement, 5x w/ close supervision and emphasis on eccentric control upon returning to sit. Returned to room and ended session in recliner, call bell within reach and all needs met.   Therapy Documentation Precautions:  Precautions Precautions: Back, Fall Precaution Booklet Issued: No Precaution Comments: Pt able to recall 3/3 back precautions Required Braces or Orthoses: Spinal Brace Spinal Brace:  Thoracolumbosacral orthotic, Applied in sitting position Restrictions Weight Bearing Restrictions: No Vital Signs: Therapy Vitals Temp: 98.5 F (36.9 C) Temp Source: Oral Pulse Rate: 97 Resp: 19 BP: 120/71 Patient Position (if appropriate): Sitting Oxygen Therapy SpO2: 99 % O2 Device: Not Delivered Pain: Pain Assessment Pain Assessment: 0-10 Pain Score: 7  Pain Type: Surgical pain Pain Location: Back Pain Intervention(s): Medication (See eMAR)  See Function Navigator for Current Functional Status.   Therapy/Group: Individual Therapy  Jerilee Space K Arnette 10/26/2017, 3:26 PM

## 2017-10-26 NOTE — Progress Notes (Signed)
[facility]IC[facility] RE[facility]ABILITATION     PROGRESS NOTE    Subjective/Complaints: Pt seen lying in bed this AM. He states he slept well overnight.  He also notes a BM yesterday.   ROS: Denies nausea, vomiting, diarrhea, shortness of breath or chest pain   Objective: Vital Signs: Blood pressure 117/87, pulse 79, temperature 98.2 F (36.8 C), temperature source Oral, resp. rate 18, height 5\' 3"  (1.6 m), weight 106.2 kg (234 lb 2.1 oz), SpO2 98 %. No results found. Recent Labs    10/24/17 0542 10/25/17 0538  WBC 10.9* 10.0  HGB 8.3* 8.1*  HCT 25.8* 25.3*  PLT 202 197   Recent Labs    10/24/17 0542  NA 138  K 3.8  CL 103  GLUCOSE 123*  BUN 13  CREATININE 0.98  CALCIUM 7.8*   CBG (last 3)  No results for input(s): GLUCAP in the last 72 hours.  Wt Readings from Last 3 Encounters:  10/23/17 106.2 kg (234 lb 2.1 oz)  10/18/17 98 kg (216 lb 0.8 oz)  10/11/17 98 kg (216 lb 1.6 oz)    Physical Exam:  Constitutional: He appearswell-developedand well-nourished.No distress. HENT: Normocephalicand atraumatic.  Eyes:EOMare normal. No discharge.  Cardiovascular:Normal rateand regular rhythm. No JVD. Respiratory:Effort normaland breath sounds normal.  GI: Bowel sounds are normal. He exhibitsno distension.  Musculoskeletal: Mild LE edema. No tenderness.  Neurological: He isalertand oriented. Decreased insight and awareness.  Delays in processing.  Motor: B/l Upper extremity strength grossly 5/5 proximal to distal.  B/l LE HF 3+/5, 4+/5 KE, 5/5 ADF/PF Skin: Skin iswarmand dry. He isnot diaphoretic.Incision c/d/i. Psychiatric: He has anormal mood and affect. Hisbehavior is normal.Judgmentand thought contentnormal  Assessment/Plan: 1.  Functional deficits secondary to lumbar scoliosis with stenosis and radiculopathy which require 3+ hours per day of interdisciplinary therapy in a comprehensive inpatient rehab setting. Physiatrist is providing  close team supervision and 24 hour management of active medical problems listed below. Physiatrist and rehab team continue to assess barriers to discharge/monitor patient progress toward functional and medical goals.  Function:  Bathing Bathing position   Position: Shower  Bathing parts Body parts bathed by patient: Right lower leg, Right arm, Left arm, Chest, Abdomen, Front perineal area, Right upper leg, Left upper leg, Left lower leg, Back Body parts bathed by helper: Buttocks  Bathing assist Assist Level: Touching or steadying assistance(Pt > 75%)      Upper Body Dressing/Undressing Upper body dressing   What is the patient wearing?: Pull over shirt/dress, Orthosis     Pull over shirt/dress - Perfomed by patient: Thread/unthread right sleeve, Thread/unthread left sleeve, Put head through opening, Pull shirt over trunk       Orthosis activity level: Performed by helper(ModA)  Upper body assist Assist Level: Touching or steadying assistance(Pt > 75%)   Set up : To obtain clothing/put away, To apply TLSO, cervical collar  Lower Body Dressing/Undressing Lower body dressing   What is the patient wearing?: Pants, Non-skid slipper socks     Pants- Performed by patient: Thread/unthread right pants leg, Pull pants up/down Pants- Performed by helper: Thread/unthread left pants leg Non-skid slipper socks- Performed by patient: Don/doff right sock, Don/doff left sock Non-skid slipper socks- Performed by helper: Don/doff right sock, Don/doff left sock                  Lower body assist Assist for lower body dressing: Touching or steadying assistance (Pt > 75%)      Naval architect  activity did not occur: No continent bowel/bladder event Toileting steps completed by patient: Adjust clothing prior to toileting, Adjust clothing after toileting, Performs perineal hygiene   Toileting Assistive Devices: Grab bar or rail  Toileting assist Assist level: Touching or  steadying assistance (Pt.75%)   Transfers Chair/bed transfer   Chair/bed transfer method: Stand pivot Chair/bed transfer assist level: Supervision or verbal cues Chair/bed transfer assistive device: Armrests, Environmental consultant, Orthosis     Locomotion Ambulation     Max distance: 250' Assist level: Touching or steadying assistance (Pt > 75%)   Wheelchair   Type: Manual Max wheelchair distance: 150 ft Assist Level: Touching or steadying assistance (Pt > 75%)  Cognition Comprehension Comprehension assist level: Understands complex 90% of the time/cues 10% of the time  Expression Expression assist level: Expresses complex 90% of the time/cues < 10% of the time  Social Interaction Social Interaction assist level: Interacts appropriately with others - No medications needed.  Problem Solving Problem solving assist level: Solves basic problems with no assist  Memory Memory assist level: Recognizes or recalls 90% of the time/requires cueing < 10% of the time   Medical Problem List and Plan: 1.Functional mobility deficitssecondary to lumbar scoliosis with radiculopathy status post lumbar decompression and fusion with postoperative encephalopathy   Cont CIR 2. DVT Prophylaxis/Anticoagulation: Mechanical:Sequential compression devices, below kneeBilateral lower extremities 3. Pain Management:Oxycodone prn effective. Continue Neurontin for neuropathy. 4. Mood:LCSW to follow for evaluation and support. 5. Neuropsych: This patientiscapable of making decisions on hisown behalf. 6. Skin/Wound Care:monitor incisions for healing. 7. Fluids/Electrolytes/Nutrition:Encourage p.o. intake.   8. ATF:TDDUKGU BP bid--continue Norvasc and HCTZ daily   Relatively controlled on 1/22 9. RKY:HCWCBJSEG consistent use of CPAP. 10. ABLA:    Hemoglobin 8.1 on 1/21   Cont to monitor 11. Scrotal edema: Likely related to anterior incision and associated bruising and postoperative drainage.  Generally the  scrotum is nontender.  Elevate as possible and observe for now.   12. Hypoalbuminemia   Supplement initiated  13. Constipation    Bowel meds increased on 1/21   Improving  LOS (Days) 3 A FACE TO FACE EVALUATION WAS PERFORMED  Versia Mignogna Lorie Phenix, MD 10/26/2017 8:20 AM

## 2017-10-27 ENCOUNTER — Inpatient Hospital Stay (HOSPITAL_COMMUNITY): Payer: 59 | Admitting: Occupational Therapy

## 2017-10-27 ENCOUNTER — Inpatient Hospital Stay (HOSPITAL_COMMUNITY): Payer: 59

## 2017-10-27 ENCOUNTER — Inpatient Hospital Stay (HOSPITAL_COMMUNITY): Payer: 59 | Admitting: Physical Therapy

## 2017-10-27 ENCOUNTER — Inpatient Hospital Stay (HOSPITAL_COMMUNITY): Payer: 59 | Admitting: *Deleted

## 2017-10-27 DIAGNOSIS — R609 Edema, unspecified: Secondary | ICD-10-CM

## 2017-10-27 MED ORDER — FUROSEMIDE 20 MG PO TABS
20.0000 mg | ORAL_TABLET | Freq: Two times a day (BID) | ORAL | Status: DC
  Administered 2017-10-27 – 2017-10-29 (×5): 20 mg via ORAL
  Filled 2017-10-27 (×5): qty 1

## 2017-10-27 MED FILL — Sodium Chloride IV Soln 0.9%: INTRAVENOUS | Qty: 1000 | Status: AC

## 2017-10-27 MED FILL — Heparin Sodium (Porcine) Inj 1000 Unit/ML: INTRAMUSCULAR | Qty: 30 | Status: AC

## 2017-10-27 NOTE — Progress Notes (Signed)
[facility] MEDICINE & REHABILITATION     PROGRESS NOTE    Subjective/Complaints: Pt seen lyinging bed this AM.  He states he sleptr fairly overnight, "just not a back sleeper".     ROS: Denies nausea, vomiting, diarrhea, shortness of breath or chest pain   Objective: Vital Signs: Blood pressure 124/70, pulse 76, temperature 98.7 F (37.1 C), temperature source Oral, resp. rate 18, height 5\' 3"  (1.6 m), weight 106.2 kg (234 lb 2.1 oz), SpO2 100 %. No results found. Recent Labs    10/25/17 0538  WBC 10.0  HGB 8.1*  HCT 25.3*  PLT 197   No results for input(s): NA, K, CL, GLUCOSE, BUN, CREATININE, CALCIUM in the last 72 hours.  Invalid input(s): CO CBG (last 3)  No results for input(s): GLUCAP in the last 72 hours.  Wt Readings from Last 3 Encounters:  10/23/17 106.2 kg (234 lb 2.1 oz)  10/18/17 98 kg (216 lb 0.8 oz)  10/11/17 98 kg (216 lb 1.6 oz)    Physical Exam:  Constitutional: He appearswell-developedand well-nourished.No distress. HENT: Normocephalicand atraumatic.  Eyes:EOMare normal. No discharge.  Cardiovascular:RRR. No JVD. Respiratory:Effort normal and breath sounds normal.  GI: Bowel sounds are normal. He exhibitsno distension.  Musculoskeletal: Mild LE edema. Scrotal edema No tenderness.  Neurological: He isalertand oriented. Decreased insight and awareness.  Delays in processing, improving.  Motor: B/l Upper extremity strength grossly 5/5 proximal to distal.  B/l LE HF 3+/5, 4+/5 KE, 5/5 ADF/PF Skin: Skin iswarmand dry. He isnot diaphoretic.Incision c/d/i. Psychiatric: He has anormal mood and affect. Hisbehavior is normal.Judgmentand thought contentnormal  Assessment/Plan: 1.  Functional deficits secondary to lumbar scoliosis with stenosis and radiculopathy which require 3+ hours per day of interdisciplinary therapy in a comprehensive inpatient rehab setting. Physiatrist is providing close team supervision and 24 hour  management of active medical problems listed below. Physiatrist and rehab team continue to assess barriers to discharge/monitor patient progress toward functional and medical goals.  Function:  Bathing Bathing position   Position: Shower  Bathing parts Body parts bathed by patient: Right lower leg, Right arm, Left arm, Chest, Abdomen, Front perineal area, Right upper leg, Left upper leg, Left lower leg, Back Body parts bathed by helper: Buttocks  Bathing assist Assist Level: Touching or steadying assistance(Pt > 75%)      Upper Body Dressing/Undressing Upper body dressing   What is the patient wearing?: Pull over shirt/dress, Orthosis     Pull over shirt/dress - Perfomed by patient: Thread/unthread right sleeve, Thread/unthread left sleeve, Put head through opening, Pull shirt over trunk       Orthosis activity level: Performed by helper(ModA)  Upper body assist Assist Level: Supervision or verbal cues   Set up : To obtain clothing/put away, To apply TLSO, cervical collar  Lower Body Dressing/Undressing Lower body dressing   What is the patient wearing?: Pants, Non-skid slipper socks     Pants- Performed by patient: Thread/unthread right pants leg, Pull pants up/down Pants- Performed by helper: Thread/unthread left pants leg Non-skid slipper socks- Performed by patient: Don/doff right sock, Don/doff left sock Non-skid slipper socks- Performed by helper: Don/doff right sock, Don/doff left sock                  Lower body assist Assist for lower body dressing: Touching or steadying assistance (Pt > 75%)      Toileting Toileting   Toileting steps completed by patient: Adjust clothing prior to toileting, Adjust clothing after toileting, Performs perineal  hygiene Toileting steps completed by helper: Adjust clothing prior to toileting, Performs perineal hygiene, Adjust clothing after toileting(per Blen Enuol, NT) Toileting Assistive Devices: Grab bar or rail  Toileting  assist Assist level: Touching or steadying assistance (Pt.75%)   Transfers Chair/bed transfer   Chair/bed transfer method: Ambulatory Chair/bed transfer assist level: Supervision or verbal cues Chair/bed transfer assistive device: Armrests, Environmental consultant, Orthosis     Locomotion Ambulation     Max distance: 150 ft Assist level: Supervision or verbal cues   Wheelchair   Type: Manual Max wheelchair distance: 150 ft Assist Level: Touching or steadying assistance (Pt > 75%)  Cognition Comprehension Comprehension assist level: Understands complex 90% of the time/cues 10% of the time  Expression Expression assist level: Expresses complex 90% of the time/cues < 10% of the time  Social Interaction Social Interaction assist level: Interacts appropriately with others - No medications needed.  Problem Solving Problem solving assist level: Solves basic problems with no assist  Memory Memory assist level: Recognizes or recalls 90% of the time/requires cueing < 10% of the time   Medical Problem List and Plan: 1.Functional mobility deficitssecondary to lumbar scoliosis with radiculopathy status post lumbar decompression and fusion with postoperative encephalopathy   Cont CIR 2. DVT Prophylaxis/Anticoagulation: Mechanical:Sequential compression devices, below kneeBilateral lower extremities 3. Pain Management:Oxycodone prn effective. Continue Neurontin for neuropathy. 4. Mood:LCSW to follow for evaluation and support. 5. Neuropsych: This patientiscapable of making decisions on hisown behalf. 6. Skin/Wound Care:monitor incisions for healing. 7. Fluids/Electrolytes/Nutrition:Encourage p.o. intake.   8. XUX:YBFXOVA BP bid--continue Norvasc and HCTZ daily   Relatively controlled on 1/23 9. NVB:TYOMAYOKH consistent use of CPAP. 10. ABLA:    Hemoglobin 8.1 on 1/21   Cont to monitor 11. Scrotal edema and LE edema:    Likely related to anterior incision and associated bruising and  postoperative drainage.     Elevate    Lasix 20 BID started on 1/23 12. Hypoalbuminemia   Supplement initiated  13. Constipation    Bowel meds increased on 1/21   Improving  LOS (Days) 4 A FACE TO FACE EVALUATION WAS PERFORMED  Ankit Lorie Phenix, MD 10/27/2017 8:29 AM

## 2017-10-27 NOTE — Progress Notes (Signed)
Physical Therapy Session Note  Patient Details  Name: Bobby [name] MRN: [id number] Date of Birth: Apr 02, 1956  Today's Date: 10/27/2017 PT Individual Time: 1415-1445 PT Individual Time Calculation (min): 30 min   Short Term Goals: Week 1:  PT Short Term Goal 1 (Week 1): Pt will perform all bed mobility with supervision while maintaining back precautions PT Short Term Goal 2 (Week 1): Pt will ascend/descend 4 steps with min assist using handrails as needed PT Short Term Goal 3 (Week 1): Pt will ambulate 150 ft with min assist using LRAD  Skilled Therapeutic Interventions/Progress Updates: Pt presented in recliner agreeable to therapy. Pt indicated back pain decreasing from earlier today. Pt ambulated to rehab gym supervision with RW and ascend/descend x 4 stairs with B rails. Pt participated in standing balance with [employer]irex for focus on ankle strategies and performed wt shifting alternating toe taps no AD. Pt returned to room in same manner as prior and returned to recliner with needs met.      Therapy Documentation Precautions:  Precautions Precautions: Back, Fall Precaution Booklet Issued: No Precaution Comments: Pt able to recall 3/3 back precautions Required Braces or Orthoses: Spinal Brace Spinal Brace: Thoracolumbosacral orthotic, Applied in sitting position Restrictions Weight Bearing Restrictions: No General:   Vital Signs: Therapy Vitals Temp: 97.8 F (36.6 C) Temp Source: Oral Pulse Rate: (!) 101 Resp: 18 BP: 118/71 Patient Position (if appropriate): Sitting Oxygen Therapy SpO2: 100 % O2 Device: Not Delivered Pain: Pain Assessment Pain Assessment: Faces Faces Pain Scale: Hurts a little bit Pain Type: Surgical pain Pain Location: Back Pain Orientation: Lower Pain Descriptors / Indicators: Discomfort Pain Intervention(s): Repositioned;Emotional support  See Function Navigator for Current Functional Status.   Therapy/Group: Individual Therapy  Bobby [name]  Bobby [name], PTA  10/27/2017, 4:35 PM

## 2017-10-27 NOTE — Progress Notes (Signed)
Physical Therapy Note  Patient Details  Name: Bobby [name] MRN: [id number] Date of Birth: 1956/01/02 Today's Date: 10/27/2017  0800-0900, 60 min individual tx Pain: 8/10; medicated during session  Bed mobility in flat bed without using rails for rolling L><R and L side lying> sit with extra time, supervision and cues to push with LUE.  Therapeutic exercise performed with LEs to increase strength for functional mobility: in R/L sidelying: bil hip flexion, R/L clam shells for hip abdudtion , x 10 each.  Sitting EOB, pt required 2 cues to avoid flexion before donning TLSO.  Pt donned TLSO with mod cues for avoiding rotation, finding straps, etc.  Gait training on level tile with RW with supervisin.  Simulated car transfer with extra time, mod cues and supervision for technique.  Sustained stretch bil heel cords and hamstrings in standing on wedge x 3 minutes.  Balance challenge through decreasing bil UE support> 0UE support x 30 seconds.  Pt exhibited hip strategy for balance technique.  Gait training to return to room.  Pt left resting in recliner with all needs at hand.    See function navigator for current status.  Atia Haupt 10/27/2017, 7:50 AM

## 2017-10-27 NOTE — Progress Notes (Signed)
Patient has home unit CPAP. Patient is able to place himself on when he is ready.

## 2017-10-27 NOTE — Progress Notes (Signed)
Occupational Therapy Session Note  Patient Details  Name: Bobby [name] MRN: [id number] Date of Birth: [birth year]  Today's Date: 10/27/2017 OT Individual Time: 1100-1200 OT Individual Time Calculation (min): 60 min    Short Term Goals: Week 1:  OT Short Term Goal 1 (Week 1): N/A due to ELOS  Skilled Therapeutic Interventions/Progress Updates:    Pt presents sleeping in recliner, easy to arouse and agreeable to OT tx session with no c/o pain. Pt ambulates throughout session at RW level with MinGuard-Supervision. Pt dons back brace with MinA, stands at sink to brush teeth with MinGuard and min verbal cues for adhering to back precautions. Pt ambulates to therapy apartment, completes furniture transfer sit<>stand from [place] with [place]. Participates in dynamic standing activity in kitchen, reaching and obtaining items within appropriate reach and demonstrating good use of RW during mobility, education provided on safety during simple iADL meal prep task while adhering to back precautions with Pt verbalizing understanding. Pt transitions to therapy gym, engages in 4 rounds of standing horseshoes, alternating between using L/R hand to toss horseshoes and using reacher to pick up horseshoes between each round with seated rest breaks between rounds. Pt completes 10 min on arm bike in sitting, alternating between pedaling forward/backward. Pt ambulates back to room using RW end of session where he was left seated in recliner, lunch tray set up, call bell and needs within reach.   Therapy Documentation Precautions:  Precautions Precautions: Back, Fall Precaution Booklet Issued: No Precaution Comments: Pt able to recall 3/3 back precautions Required Braces or Orthoses: Spinal Brace Spinal Brace: Thoracolumbosacral orthotic, Applied in sitting position Restrictions Weight Bearing Restrictions: No   ADL: ADL ADL Comments: Please see functional navigator for ADL status  See Function  Navigator for Current Functional Status.   Therapy/Group: Individual Therapy  Raymondo Band 10/27/2017, 12:27 PM

## 2017-10-27 NOTE — Progress Notes (Signed)
Occupational Therapy Session Note  Patient Details  Name: Bobby [name] MRN: [id number] Date of Birth: 1956/01/23  Today's Date: 10/27/2017 OT Individual Time: 1300-1401 OT Individual Time Calculation (min): 61 min    Short Term Goals: Week 1:  OT Short Term Goal 1 (Week 1): N/A due to ELOS  Skilled Therapeutic Interventions/Progress Updates:    Pt completed shower and dressing during session.  Mod instructional cueing for staying inside of the walker during transfers.  He was able to complete toilet transfer with supervision to elevated toilet with RW.  Decreased thoroughness with toilet hygiene secondary to increased swelling and he usually reaches between his legs.  He transferred to the shower seat for bathing with supervision using the [place] sponge for washing back and washing his LEs.  Dressing completed at the EOB with supervision using AE.  Mod demonstrational cueing for orientation of TLSO and for donning.  Had him ambulate with use of the RW and reacher to pick up towels and clothing.  Still with min instructional cueing for staying in the walker when picking up items.  Finished session with pt in bedside recliner with call button and phone in reach and call button and phone in reach.    Therapy Documentation Precautions:  Precautions Precautions: Back, Fall Precaution Booklet Issued: No Precaution Comments: Pt able to recall 3/3 back precautions Required Braces or Orthoses: Spinal Brace Spinal Brace: Thoracolumbosacral orthotic, Applied in sitting position Restrictions Weight Bearing Restrictions: No   Pain: Pain Assessment Pain Assessment: Faces Faces Pain Scale: Hurts a little bit Pain Type: Surgical pain Pain Location: Back Pain Orientation: Lower Pain Descriptors / Indicators: Discomfort Pain Intervention(s): Repositioned;Emotional support ADL: See Function Navigator for Current Functional Status.   Therapy/Group: Individual Therapy  Jaiden Dinkins  OTR/L 10/27/2017, 2:08 PM

## 2017-10-27 NOTE — Evaluation (Signed)
Recreational Therapy Assessment and Plan  Patient Details  Name: Bobby [name] MRN: [id number] Date of Birth: 1956/01/04 Today's Date: 10/27/2017  Rehab Potential: Good ELOS: 7 days   Assessment  Problem List:      Patient Active Problem List   Diagnosis Date Noted  . Scoliosis of lumbar region due to degenerative disease of spine in adult 10/23/2017  . Scoliosis of thoracolumbar spine 10/18/2017  . Hypertension, essential 08/30/2017  . Neck pain 01/08/2017  . Osteoarthritis of spine with radiculopathy, lumbar region 02/19/2016  . Foot drop, left 11/29/2015  . Encounter for general adult medical examination with abnormal findings 11/29/2015    Past Medical History:      Past Medical History:  Diagnosis Date  . Elevated liver enzymes   . GERD (gastroesophageal reflux disease)   . Heart murmur    as a child only; outgrew it  . History of kidney stones    last attack was in 2016  . Hypercholesterolemia   . Hypertension   . Osteoarthritis    spine  . Radiculopathy   . Scoliosis   . Sleep apnea    does not wear CPAP since weight loss  . Vitamin D deficiency   . Wears glasses    Past Surgical History:  Past Surgical History:  Procedure Laterality Date  . ABDOMINAL EXPOSURE N/A 10/18/2017   Procedure: ABDOMINAL EXPOSURE;  Surgeon: Rosetta Posner, MD;  Location: [facility];  Service: Vascular;  Laterality: N/A;  Part-1  . ANTERIOR FUSION CERVICAL SPINE  2002   . ANTERIOR LATERAL LUMBAR FUSION 4 LEVELS Left 10/18/2017   Procedure: Left Lumbar One-Two,Lumbar Two-Three,Lumbar Three-Four Anterolateral lumbar interbody fusion;  Surgeon: Erline Levine, MD;  Location: [facility];  Service: Neurosurgery;  Laterality: Left;  anterolateral approach Part-2  . ANTERIOR LUMBAR FUSION N/A 10/18/2017   Procedure: Lumbar Five-Sacral One Anterior lumbar interbody fusion with Dr. Curt Jews;  Surgeon: Erline Levine, MD;  Location: [facility];  Service: Neurosurgery;  Laterality:  N/A;  Part-1 Abdominal approach  . APPLICATION OF INTRAOPERATIVE CT SCAN N/A 10/19/2017   Procedure: APPLICATION OF INTRAOPERATIVE CT SCAN;  Surgeon: Erline Levine, MD;  Location: [place];  Service: Neurosurgery;  Laterality: N/A;  APPLICATION OF INTRAOPERATIVE CT SCAN  . BACK SURGERY    . COLONOSCOPY W/ BIOPSIES AND POLYPECTOMY    . LUMBAR LAMINECTOMY/DECOMPRESSION MICRODISCECTOMY Left 02/19/2016   Procedure: Laminectomy and Foraminotomy -Left Lumbar three-four, Lumbar four-five, Lumbar five-Sacral one ;  Surgeon: Ashok Pall, MD;  Location: [place];  Service: Neurosurgery;  Laterality: Left;  . POSTERIOR LUMBAR FUSION 4 LEVEL N/A 10/19/2017   Procedure: Thoracic Eight to Pelvis fixation and pedicle screws  Transforaminal Lumbar Interbody Fusion Lumbar Four-Five with possible osteotomy;  Surgeon: Erline Levine, MD;  Location: [place];  Service: Neurosurgery;  Laterality: N/A;  Thoracic Eight to Pelvis fixation and pedicle screws  Transforaminal Lumbar Interbody Fusion Lumbar Four-Five with possible osteotomy     Assessment & Plan Clinical Impression: Patient is a 62 y.o. year old male with history of HTN, OSA, back pain with numbness LUE and left arm due to scoliosis due to DDD. He was admitted on 10/18/17 for two part anterolateral lumbar fusion from L5-S1 by Dr. Vertell Limber. Post op with lethargy, confusion and difficulty following simple commands to complete ADLS.He hashad incontinence of bowel and bladder per reports.Pain control improving.CIR recommended due to functional decline. Patient transferred to CIR on 10/23/2017.  Pt presents with decreased activity tolerance, decreased functional mobility, decreased balance  Limiting pt's independence with leisure/community pursuits.  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, drilling for Education?: Yes Patient Agreeable to [employer]?: Yes Recreational Therapy Orientation Orientation -Reviewed with patient: Available activity resources Strengths/Weaknesses Patient Strengths/Abilities: Willingness to participate;Active premorbidly Patient weaknesses: Physical limitations TR Patient demonstrates impairments in the following area(s): Endurance;Motor;Pain  Plan Rec Therapy Plan Is patient appropriate for Therapeutic Recreation?: Yes Rehab Potential: Good Treatment times per week: Min 1 TR session/group >20 minutes during LOS Estimated Length of Stay: 7 days TR Treatment/Interventions: Adaptive equipment instruction;Group participation (Comment);Therapeutic exercise;Community reintegration;Recreation/leisure participation;Balance/vestibular training;Functional mobility training;Patient/family education;Therapeutic activities  Recommendations for other services: None   Discharge Criteria: Patient will be discharged from TR if patient refuses treatment 3 consecutive times without medical reason.  If treatment goals not met, if there is a change in medical status, if patient makes no progress towards goals or if patient is discharged from hospital.  The above assessment, treatment plan, treatment alternatives and goals were discussed and mutually agreed upon: by patient  [place] 10/27/2017, 4:07 PM

## 2017-10-27 NOTE — Plan of Care (Signed)
  Progressing RH BOWEL ELIMINATION RH STG MANAGE BOWEL WITH ASSISTANCE Description STG Manage Bowel with Assistance.  Mod I  10/27/2017 1304 - Progressing by Brita Romp, RN RH STG MANAGE BOWEL W/MEDICATION W/ASSISTANCE Description STG Manage Bowel with Medication with Assistance. Mod I  10/27/2017 1304 - Progressing by Brita Romp, RN RH SKIN INTEGRITY RH STG ABLE TO PERFORM INCISION/WOUND CARE W/ASSISTANCE Description STG Able To Perform Incision/Wound Care With Assistance. Mod I  10/27/2017 1304 - Progressing by Brita Romp, RN RH SAFETY RH STG ADHERE TO SAFETY PRECAUTIONS W/ASSISTANCE/DEVICE Description STG Adhere to Safety Precautions With Assistance/Device. Mod I  10/27/2017 1304 - Progressing by Brita Romp, RN RH PAIN MANAGEMENT RH STG PAIN MANAGED AT OR BELOW PT'S PAIN GOAL Description Less than 4  10/27/2017 1304 - Progressing by Brita Romp, RN

## 2017-10-27 NOTE — Progress Notes (Signed)
Patient has home CPAP machine and is able to place on himself when he is ready for bed. RT informed patient to have RT called if assistance is needed. RT will monitor as needed.

## 2017-10-28 ENCOUNTER — Inpatient Hospital Stay (HOSPITAL_COMMUNITY): Payer: 59 | Admitting: Occupational Therapy

## 2017-10-28 ENCOUNTER — Inpatient Hospital Stay (HOSPITAL_COMMUNITY): Payer: 59 | Admitting: *Deleted

## 2017-10-28 ENCOUNTER — Inpatient Hospital Stay (HOSPITAL_COMMUNITY): Payer: 59 | Admitting: Physical Therapy

## 2017-10-28 ENCOUNTER — Telehealth: Payer: Self-pay

## 2017-10-28 LAB — BASIC METABOLIC PANEL
Anion gap: 8 (ref 5–15)
BUN: 18 mg/dL (ref 6–20)
CHLORIDE: 101 mmol/L (ref 101–111)
CO2: 27 mmol/L (ref 22–32)
CREATININE: 0.91 mg/dL (ref 0.61–1.24)
Calcium: 8.3 mg/dL — ABNORMAL LOW (ref 8.9–10.3)
GFR calc Af Amer: >60 mL/min (ref >60)
GFR calc non Af Amer: >60 mL/min (ref >60)
GLUCOSE: 126 mg/dL — AB (ref 65–99)
Potassium: 3.7 mmol/L (ref 3.5–5.1)
SODIUM: 136 mmol/L (ref 135–145)

## 2017-10-28 LAB — CBC
HCT: 25.3 % — ABNORMAL LOW (ref 39.0–52.0)
Hemoglobin: 8.2 g/dL — ABNORMAL LOW (ref 13.0–17.0)
MCH: 29.6 pg (ref 26.0–34.0)
MCHC: 32.4 g/dL (ref 30.0–36.0)
MCV: 91.3 fL (ref 78.0–100.0)
PLATELETS: 332 10*3/uL (ref 150–400)
RBC: 2.77 MIL/uL — ABNORMAL LOW (ref 4.22–5.81)
RDW: 14.9 % (ref 11.5–15.5)
WBC: 8.4 10*3/uL (ref 4.0–10.5)

## 2017-10-28 NOTE — Progress Notes (Addendum)
Physical Therapy Note  Patient Details  Name: Bobby [name] MRN: [id number] Date of Birth: [birth year] Today's Date: 10/28/2017  1030-1130, 60 min group tx  TLSO already donned  Pt participated in community setting: ambulation and w/c propulsion, energy conservation techniques, noting obstacles when ambulating in congested environment, fall recovery using 911 if needed at home. PT recommended that pt don TLSO if he had a fall without it on. Due to body habitus, pt would be safer getting physical assistance to get off of floor, rather than attempting it himself. Pt conveyed understanding.  Pt left resting in recliner with all needs within reach.  See function navigator for current status.  Bryanne Riquelme 10/28/2017, 12:18 PM

## 2017-10-28 NOTE — Progress Notes (Signed)
Occupational Therapy Discharge Summary  Patient Details  Name: Bobby [name] MRN: [id number] Date of Birth: [birth year]  Today's Date: 10/28/2017 OT Individual Time: 1607-3710 OT Individual Time Calculation (min): 55 min    Session Note:  Pt completed bathing and dressing with spouse present during session.  He was able to use AE appropriately throughout session for removing clothing prior to shower and then for washing LEs and completing LB dressing.  He continues to need min to mod instructional cueing for sit to stand sequencing with UE use and to not hold onto the walker when attempting to stand, as well as for not turning the walker to the side during reaching tasks.  Educated his wife on AE use as well as need for tub/shower seat for safety and his need to sit down when washing his LEs as well as for LB dressing.  Also educated both on setup of kitchen for snacks and simple meal prep with use of the RW.  Finished session with completion of step over tub transfer with supervision to shower seat.  Pt left in room in recliner with spouse present for next PT session.   Patient has met 8 of 9 long term goals due to improved activity tolerance, improved balance and ability to compensate for deficits.  Patient to discharge at overall Supervision level.  Patient's care partner is independent to provide the necessary physical and cognitive assistance at discharge.    Reasons goals not met: Pt needs supervision for toilet hygiene  Recommendation:  Patient will benefit from ongoing skilled OT services in home health setting to continue to advance functional skills in the area of BADL and Reduce care partner burden.  Pt still needs supervision for toileting tasks as well as mod instructional cueing for correct use of walker during selfcare tasks as he will step away from it when reaching for items.  Will benefit from [facility] eval for safety as his wife will not be available 24/7 and he still needs  supervision for some tasks.   Equipment: tub seat and RW  Reasons for discharge: treatment goals met and discharge from hospital  Patient/family agrees with progress made and goals achieved: Yes  OT Discharge Precautions/Restrictions  Precautions Precautions: Back;Fall Precaution Comments: Pt able to recall 3/3 back precautions with min questioning cueing Required Braces or Orthoses: Spinal Brace Spinal Brace: Thoracolumbosacral orthotic;Applied in sitting position Restrictions Weight Bearing Restrictions: No   Pain Pain Assessment Pain Assessment: 0-10 Pain Score: 3  Pain Type: Acute pain Pain Location: Back Pain Orientation: Lower Pain Descriptors / Indicators: Aching;Discomfort Pain Frequency: Constant Pain Onset: On-going Pain Intervention(s): Medication (See eMAR) ADL ADL ADL Comments: Please see functional navigator for ADL status Vision Baseline Vision/History: Wears glasses Wears Glasses: At all times Patient Visual Report: No change from baseline Vision Assessment?: No apparent visual deficits Perception  Perception: Within Functional Limits Praxis Praxis: Intact Cognition Overall Cognitive Status: Impaired/Different from baseline Arousal/Alertness: Awake/alert Orientation Level: Oriented X4 Attention: Sustained;Selective Sustained Attention: Appears intact Selective Attention: Appears intact Memory: Impaired Memory Impairment: Decreased recall of new information Awareness: Impaired Awareness Impairment: Anticipatory impairment Problem Solving: Impaired Problem Solving Impairment: Functional complex Behaviors: Impulsive Safety/Judgment: Impaired Comments: Pt still needs min to mod instructional cueing for staying inside of the walker and pushing up from the surface during transfers.   Sensation Sensation Light Touch: Appears Intact Stereognosis: Appears Intact Hot/Cold: Appears Intact Proprioception: Appears Intact Coordination Gross Motor  Movements are Fluid and Coordinated: Yes Fine Motor Movements are Fluid  and Coordinated: Yes Coordination and Movement Description: Coordination is Kerr for BUEs Motor  Motor Motor: Within Functional Limits Motor - Discharge Observations: generalized weakness Mobility  Transfers Transfers: Stand to Sit;Sit to Stand Sit to Stand: 6: Modified independent (Device/Increase time);With upper extremity assist;With armrests;From chair/3-in-1 Stand to Sit: 6: Modified independent (Device/Increase time);With upper extremity assist;To chair/3-in-1  Trunk/Postural Assessment  Cervical Assessment Cervical Assessment: Within Functional Limits Thoracic Assessment Thoracic Assessment: Within Functional Limits Lumbar Assessment Lumbar Assessment: Within Functional Limits  Balance Balance Balance Assessed: Yes Static Sitting Balance Static Sitting - Level of Assistance: 7: Independent Dynamic Sitting Balance Dynamic Sitting - Balance Support: During functional activity Dynamic Sitting - Level of Assistance: 7: Independent Static Standing Balance Static Standing - Balance Support: During functional activity Static Standing - Level of Assistance: 6: Modified independent (Device/Increase time) Dynamic Standing Balance Dynamic Standing - Balance Support: During functional activity Dynamic Standing - Level of Assistance: 6: Modified independent (Device/Increase time) Extremity/Trunk Assessment RUE Assessment RUE Assessment: Exceptions to WFL(Strength and AROM WFLS for selfcare tasks but not formally assessed secondary to back precautions) LUE Assessment LUE Assessment: Within Functional Limits(AROM and strength WFLS for selfcare tasks.  )   See Function Navigator for Current Functional Status.  Indra Wolters OTR/L 10/28/2017, 1:18 PM

## 2017-10-28 NOTE — Telephone Encounter (Signed)
Copied from [place] [id number]. Topic: Inquiry >> Oct 28, 2017 11:19 AM Conception Chancy, NT wrote: Bobby [name] is calling from [facility] and needing to schedule a HFU. Pt is being discharged 10/29/17. Dr. Caryl Bis does not have any availability. Please advise and contact Jeannie to schedule. [phone]

## 2017-10-28 NOTE — Progress Notes (Signed)
Physical Therapy Discharge Summary  Patient Details  Name: Bobby [name] MRN: [id number] Date of Birth: Aug 17, 1956  Today's Date: 10/28/2017 PT Individual Time: 6301-6010 AND 1600-1650 PT Individual Time Calculation (min): 30 min AND 50 min   Session 1:  Pt in recliner and agreeable to therapy, c/o pain as detailed below. Wife present for education. Pt performed bed mobility and car transfers, demonstrating technique used to adhere to back precautions. Wife verbalized understanding of all back precautions and safe strategies to supervise pt w/ all mobility. Additionally practiced negotiating 2 steps w/ RW as per home set-up, pt performed w/ supervision. Returned to room and ended session in recliner, call bell within reach and all needs met.   Session 2:  Pt in recliner and agreeable to therapy, c/o pain as detailed below. Pt ambulated throughout unit at Mod I level w/ RW in 100-150' bouts, supervision level assist for negotiating ramp and uneven surfaces. Negotiated 12 steps w/ supervision in preparation for community level ambulation. Instructed and performed LE strengthening exercises including partial knee bends, standing hip abduction, standing knee flexion, sit<>stands, heel raises, and toe raises. Cautioned pt in regards to pacing himself and building up to multiple sets of 10 per day while at home. Pt verbalized understanding and demo-ed all exercises correctly and safely within surgical precautions w/o cues. Issued written HEP sheet. Returned to room and ended session in w/c, call bell within reach and all needs met.   Patient has met 7 of 7 long term goals due to improved activity tolerance, improved balance, improved postural control, increased strength, decreased pain and ability to compensate for deficits.  Patient to discharge at an ambulatory level Modified Independent.   Patient's care partner is independent to provide the necessary physical assistance at discharge.  Reasons goals  not met: n/a  Recommendation:  Patient will benefit from ongoing skilled PT services in home health setting to continue to advance safe functional mobility, address ongoing impairments in LE strengthening, functional mobility, endurance, and adherence to surgical precautions, and minimize fall risk.  Equipment: RW  Reasons for discharge: treatment goals met and discharge from hospital  Patient/family agrees with progress made and goals achieved: Yes  PT Discharge Precautions/Restrictions Precautions Precautions: Back;Fall Precaution Booklet Issued: No Precaution Comments: Pt able to recall 3/3 back precautions w/o cues  Required Braces or Orthoses: Spinal Brace Spinal Brace: Thoracolumbosacral orthotic;Applied in sitting position Restrictions Weight Bearing Restrictions: No Pain Pain Assessment Pain Assessment: 0-10 Pain Score: 4  Pain Type: Acute pain Pain Location: Back Pain Orientation: Lower Pain Descriptors / Indicators: Aching;Discomfort Pain Frequency: Constant Pain Onset: On-going Pain Intervention(s): Medication (See eMAR) Vision/Perception  Perception Perception: Within Functional Limits Praxis Praxis: Intact  Cognition Overall Cognitive Status: Impaired/Different from baseline Arousal/Alertness: Awake/alert Orientation Level: Oriented X4 Attention: Sustained;Selective Sustained Attention: Appears intact Selective Attention: Appears intact Memory: Appears intact Memory Impairment: Decreased recall of new information Awareness: Appears intact Awareness Impairment: Anticipatory impairment Problem Solving: Impaired Problem Solving Impairment: Functional complex Behaviors: Impulsive Safety/Judgment: Impaired Comments: Mild impulsivity and requires min cues for safety Sensation Sensation Light Touch: Impaired by gross assessment(Dimished sensation and tingling in L foot, sensation in LEs otherwise intact) Stereognosis: Appears Intact Hot/Cold: Appears  Intact Proprioception: Impaired by gross assessment(LLE proprioception impaired 2/2 dimished sensation) Coordination Gross Motor Movements are Fluid and Coordinated: Yes Fine Motor Movements are Fluid and Coordinated: Yes Coordination and Movement Description: Coordination is WFLS for BUEs Motor  Motor Motor: Within Functional Limits Motor - Discharge Observations: generalized weakness  Mobility Bed Mobility Bed Mobility: Rolling Left;Rolling Right;Supine to Sit;Sit to Supine Rolling Right: 6: Modified independent (Device/Increase time) Rolling Left: 6: Modified independent (Device/Increase time) Supine to Sit: 6: Modified independent (Device/Increase time) Sit to Supine: 6: Modified independent (Device/Increase time) Transfers Transfers: Yes Sit to Stand: 6: Modified independent (Device/Increase time) Stand to Sit: 6: Modified independent (Device/Increase time) Stand Pivot Transfers: 6: Modified independent (Device/Increase time) Locomotion  Ambulation Ambulation: Yes Ambulation/Gait Assistance: 6: Modified independent (Device/Increase time) Ambulation Distance (Feet): 150 Feet Assistive device: Rolling walker Gait Gait: Yes Gait Pattern: Impaired Gait Pattern: Shuffle;Decreased trunk rotation Gait velocity: decreased Stairs / Additional Locomotion Stairs: Yes Stairs Assistance: 5: Supervision Stairs Assistance Details: Verbal cues for precautions/safety Stair Management Technique: Two rails Number of Stairs: 12 Height of Stairs: 6 Ramp: 5: Supervision Curb: 5: Building services engineer Mobility: No  Trunk/Postural Assessment  Cervical Assessment Cervical Assessment: Within Functional Limits Thoracic Assessment Thoracic Assessment: Within Functional Limits Lumbar Assessment Lumbar Assessment: Within Functional Limits Postural Control Postural Control: Deficits on evaluation(postural control and trunk mobility limited by back precautions)   Balance Balance Balance Assessed: Yes Static Sitting Balance Static Sitting - Level of Assistance: 7: Independent Dynamic Sitting Balance Dynamic Sitting - Balance Support: During functional activity;No upper extremity supported;Feet supported Dynamic Sitting - Level of Assistance: 7: Independent Static Standing Balance Static Standing - Balance Support: During functional activity Static Standing - Level of Assistance: 6: Modified independent (Device/Increase time) Dynamic Standing Balance Dynamic Standing - Balance Support: During functional activity Dynamic Standing - Level of Assistance: 6: Modified independent (Device/Increase time) Extremity Assessment  RUE Assessment RUE Assessment: Exceptions to WFL(Strength and AROM WFLS for selfcare tasks but not formally assessed secondary to back precautions) LUE Assessment LUE Assessment: Within Functional Limits(AROM and strength WFLS for selfcare tasks.  ) RLE Assessment RLE Assessment: Within Functional Limits LLE Assessment LLE Assessment: Exceptions to WFL(4/5 globally, limited by pain)   See Function Navigator for Current Functional Status.  Arvis Zwahlen K Arnette 10/28/2017, 4:54 PM

## 2017-10-28 NOTE — Patient Care Conference (Signed)
Inpatient RehabilitationTeam Conference and Plan of Care Update Date: 10/27/2017   Time: 2:20 PM    Patient Name: Bobby [name]      Medical Record Number: [id number]  Date of Birth: 04/15/1956 Sex: Male         Room/Bed: 4M02C/4M02C-01 Payor Info: Payor: Theme park manager / Plan: Theme park manager OTHER / Product Type: *No Product type* /    Admitting Diagnosis: Rehab  Admit Date/Time:  10/23/2017 12:14 PM Admission Comments: No comment available   Primary Diagnosis:  <principal problem not specified> Principal Problem: <principal problem not specified>  Patient Active Problem List   Diagnosis Date Noted  . Peripheral edema   . Benign essential HTN   . Acute blood loss anemia   . Scrotal edema   . Hypoalbuminemia due to protein-calorie malnutrition ([place])   . Post-operative pain   . Drug-induced constipation   . Scoliosis of lumbar region due to degenerative disease of spine in adult 10/23/2017  . Scoliosis of thoracolumbar spine 10/18/2017  . Hypertension, essential 08/30/2017  . Neck pain 01/08/2017  . Osteoarthritis of spine with radiculopathy, lumbar region 02/19/2016  . Foot drop, left 11/29/2015  . Encounter for general adult medical examination with abnormal findings 11/29/2015    Expected Discharge Date: Expected Discharge Date: 10/29/17  Team Members Present: Physician leading conference: Dr. Delice Lesch Social Worker Present: Alfonse Alpers, LCSW Nurse Present: Rayetta Pigg, RN PT Present: Leavy Cella, PT OT Present: Clyda Greener, OT SLP Present: Windell Moulding, SLP PPS Coordinator present : Daiva Nakayama, RN, CRRN     Current Status/Progress Goal Weekly Team Focus  Medical   Functional mobility deficits secondary to lumbar scoliosis with radiculopathy status post lumbar decompression and fusion with postoperative encephalopathy  Improve mobility, safety, edema, constipation, BP  See above   Bowel/Bladder   continent of b/b, LBM-1/23, uses urinal or up to BR  with walker and standby assist  maintain b/b with mod I assist  monitor b/b q shift and prn   Swallow/Nutrition/ Hydration             ADL's   Supervision for UB selfcare, supervision to min guard for LB selfcare and functional transfers with use of the RW  supervision to modified independent  selfcare retraining, balance retraining, transfer training, DME/AE education   Mobility   supervision bed mobility and transfers, supervision-min assist for gait up to 200 ft with RW, min assist 4 steps with B handrails  supervision - Mod I  d/c planning, endurance, LE strength, stairs   Communication             Safety/Cognition/ Behavioral Observations            Pain   c/o lower back pain relieved with oxy 5mg  1-2 x per shift and flexaril 1-2 times per shift   pain <2  monitor pain q shift and prn   Skin   right lower abdomen incision, flank and back incisions with honeycomb dressing D/I  maintain skin without breakdown, incisions without s/s of infection while on rehab  monitor incisions/skin q shift and prn    Rehab Goals Patient on target to meet rehab goals: Yes Rehab Goals Revised: none *See Care Plan and progress notes for long and short-term goals.     Barriers to Discharge  Current Status/Progress Possible Resolutions Date Resolved   Physician    Medical stability     See above  Therapies, diuretics, bowel reg as necessary      Nursing  PT                    OT                  SLP                SW                Discharge Planning/Teaching Needs:  Pt to return to his home with his wife.  She will be with him during the day, but works nights.  Therapists feel pt will be mainly mod I, but wife may need to have food items ready for him so he is not trying to prepare food while she is gone.  Pt will mainly sleep while wife is at work.  Wife to come for family education 10-28-17 from 66-10am.   Team Discussion:  Pt is doing pretty well medically, with some  BP issues, so Dr. Posey Pronto is watching this.  Pt with persistent edema and is on diuretics.  Pt has expected blood loss anemia.  Pt has an intact honeycomb dressing and bladder and bowel are working properly.  Pt using CPAP from home.  Pt is supervision with shower, but needs cueing to stay inside the walker and with brace. Balance is good.  Pt is mostly S with PT, also, except min A for stairs.  Will reach mod I level.  Revisions to Treatment Plan:  none    Continued Need for Acute Rehabilitation Level of Care: The patient requires daily medical management by a physician with specialized training in physical medicine and rehabilitation for the following conditions: Daily direction of a multidisciplinary physical rehabilitation program to ensure safe treatment while eliciting the highest outcome that is of practical value to the patient.: Yes Daily medical management of patient stability for increased activity during participation in an intensive rehabilitation regime.: Yes Daily analysis of laboratory values and/or radiology reports with any subsequent need for medication adjustment of medical intervention for : Post surgical problems;Neurological problems;Other  Koby Hartfield, Silvestre Mesi 10/28/2017, 12:05 PM

## 2017-10-28 NOTE — Progress Notes (Addendum)
[facility] PHYSICAL MEDICINE & REHABILITATION     PROGRESS NOTE    Subjective/Complaints: Pt seen lying in bed this AM.  He states he slept well overnight.  He notes improvement in swelling.  ROS: Denies nausea, vomiting, diarrhea, shortness of breath or chest pain   Objective: Vital Signs: Blood pressure 130/80, pulse 76, temperature 98.8 F (37.1 C), temperature source Oral, resp. rate 16, height 5\' 3"  (1.6 m), weight 106.2 kg (234 lb 2.1 oz), SpO2 97 %. No results found. Recent Labs    10/28/17 0448  WBC 8.4  HGB 8.2*  HCT 25.3*  PLT 332   Recent Labs    10/28/17 0448  NA 136  K 3.7  CL 101  GLUCOSE 126*  BUN 18  CREATININE 0.91  CALCIUM 8.3*   CBG (last 3)  No results for input(s): GLUCAP in the last 72 hours.  Wt Readings from Last 3 Encounters:  10/23/17 106.2 kg (234 lb 2.1 oz)  10/18/17 98 kg (216 lb 0.8 oz)  10/11/17 98 kg (216 lb 1.6 oz)    Physical Exam:  Constitutional: He appearswell-developedand well-nourished.No distress. HENT: Normocephalicand atraumatic.  Eyes:EOMare normal. No discharge.  Cardiovascular:RRR. No JVD. Respiratory:Effort normal and breath sounds normal.  GI: Bowel sounds are normal. He exhibitsno distension.  Musculoskeletal: Mild LE edema. Scrotal edema, slightly improved No tenderness.  Neurological: He isalertand oriented. Decreased insight and awareness.  Delays in processing, improving.  Motor: B/l Upper extremity strength grossly 5/5 proximal to distal.  B/l LE HF 4/5, 4+/5 KE, 5/5 ADF/PF Skin: Skin iswarmand dry. He isnot diaphoretic.Incision c/d/i. Psychiatric: He has anormal mood and affect. Hisbehavior is normal.Judgmentand thought contentnormal  Assessment/Plan: 1.  Functional deficits secondary to lumbar scoliosis with stenosis and radiculopathy which require 3+ hours per day of interdisciplinary therapy in a comprehensive inpatient rehab setting. Physiatrist is providing close team  supervision and 24 hour management of active medical problems listed below. Physiatrist and rehab team continue to assess barriers to discharge/monitor patient progress toward functional and medical goals.  Function:  Bathing Bathing position   Position: Shower  Bathing parts Body parts bathed by patient: Right lower leg, Right arm, Left arm, Chest, Abdomen, Front perineal area, Right upper leg, Left upper leg, Left lower leg, Back, Buttocks Body parts bathed by helper: Buttocks  Bathing assist Assist Level: Supervision or verbal cues      Upper Body Dressing/Undressing Upper body dressing   What is the patient wearing?: Pull over shirt/dress, Orthosis     Pull over shirt/dress - Perfomed by patient: Thread/unthread right sleeve, Thread/unthread left sleeve, Put head through opening, Pull shirt over trunk       Orthosis activity level: Performed by patient(with mod instructional cueing)  Upper body assist Assist Level: Supervision or verbal cues   Set up : To obtain clothing/put away, To apply TLSO, cervical collar  Lower Body Dressing/Undressing Lower body dressing   What is the patient wearing?: Pants, Non-skid slipper socks     Pants- Performed by patient: Thread/unthread right pants leg, Pull pants up/down, Thread/unthread left pants leg Pants- Performed by helper: Thread/unthread left pants leg Non-skid slipper socks- Performed by patient: Don/doff right sock, Don/doff left sock Non-skid slipper socks- Performed by helper: Don/doff right sock, Don/doff left sock                  Lower body assist Assist for lower body dressing: Supervision or verbal cues      Toileting Toileting   Toileting steps  completed by patient: Adjust clothing prior to toileting, Adjust clothing after toileting, Performs perineal hygiene Toileting steps completed by helper: Adjust clothing prior to toileting, Performs perineal hygiene, Adjust clothing after toileting(per Blen Enuol,  NT) Toileting Assistive Devices: Grab bar or rail  Toileting assist Assist level: Supervision or verbal cues   Transfers Chair/bed transfer   Chair/bed transfer method: Ambulatory Chair/bed transfer assist level: Supervision or verbal cues Chair/bed transfer assistive device: Orthosis, Medical sales representative     Max distance: 150 ft Assist level: Supervision or verbal cues   Wheelchair   Type: Manual Max wheelchair distance: 150 ft Assist Level: Touching or steadying assistance (Pt > 75%)  Cognition Comprehension Comprehension assist level: Understands complex 90% of the time/cues 10% of the time  Expression Expression assist level: Expresses basic needs/ideas: With no assist  Social Interaction Social Interaction assist level: Interacts appropriately 90% of the time - Needs monitoring or encouragement for participation or interaction.  Problem Solving Problem solving assist level: Solves basic 90% of the time/requires cueing < 10% of the time  Memory Memory assist level: Recognizes or recalls 75 - 89% of the time/requires cueing 10 - 24% of the time   Medical Problem List and Plan: 1.Functional mobility deficitssecondary to lumbar scoliosis with radiculopathy status post lumbar decompression and fusion with postoperative encephalopathy   Cont CIR, plan for d/c tomorrow. 2. DVT Prophylaxis/Anticoagulation: Mechanical:Sequential compression devices, below kneeBilateral lower extremities 3. Pain Management:Oxycodone prn effective. Continue Neurontin for neuropathy. 4. Mood:LCSW to follow for evaluation and support. 5. Neuropsych: This patientiscapable of making decisions on hisown behalf. 6. Skin/Wound Care:monitor incisions for healing. 7. Fluids/Electrolytes/Nutrition:Encourage p.o. intake.     BMP within acceptable range on 1/24 8. IHW:TUUEKCM BP bid--continue Norvasc and HCTZ daily   Relatively controlled on 1/24 9. KLK:JZPHXTAVW consistent use of  CPAP. 10. ABLA:    Hemoglobin 8.2 on 1/24   Cont to monitor 11. Scrotal edema and LE edema:    Likely related to anterior incision and associated bruising and postoperative drainage.     Elevate    Lasix 20 BID started on 1/23 12. Hypoalbuminemia   Supplement initiated  13. Constipation    Bowel meds increased on 1/21   Improving  LOS (Days) 5 A FACE TO FACE EVALUATION WAS PERFORMED  Desiree Fleming Lorie Phenix, MD 10/28/2017 8:08 AM

## 2017-10-28 NOTE — Telephone Encounter (Signed)
Does he need a tcm?

## 2017-10-28 NOTE — Progress Notes (Signed)
Recreational Therapy Session Note  Patient Details  Name: Bobby [name] MRN: [id number] Date of Birth: 07-04-1956 Today's Date: 10/28/2017  Pain: 4/10 back pain Skilled Therapeutic Interventions/Progress:  Pt participated in community reintegration group with focus on ambulation using RW, identification and negotiation of obstacles, accessing public restrooms, and energy conservation techniques.  Pt ambulated with RW with supervision.  Also discussed fall prevention for home and need to call the fire department to assist with getting up from the floor due to back precautions.  Pt stated understanding.  Therapy/Group: Group Therapy   Lorilyn Laitinen 10/28/2017, 8:12 AM

## 2017-10-28 NOTE — Plan of Care (Signed)
  Completed/Met RH BOWEL ELIMINATION RH STG MANAGE BOWEL WITH ASSISTANCE Description STG Manage Bowel with Assistance.  Mod I  10/28/2017 1515 - Completed/Met by Brita Romp, RN RH STG MANAGE BOWEL W/MEDICATION W/ASSISTANCE Description STG Manage Bowel with Medication with Assistance. Mod I  10/28/2017 1515 - Completed/Met by Brita Romp, RN RH SKIN INTEGRITY RH STG ABLE TO PERFORM INCISION/WOUND CARE W/ASSISTANCE Description STG Able To Perform Incision/Wound Care With Assistance. Mod I  10/28/2017 1515 - Completed/Met by Brita Romp, RN RH SAFETY RH STG ADHERE TO SAFETY PRECAUTIONS W/ASSISTANCE/DEVICE Description STG Adhere to Safety Precautions With Assistance/Device. Mod I  10/28/2017 1515 - Completed/Met by Brita Romp, RN RH PAIN MANAGEMENT RH STG PAIN MANAGED AT OR BELOW PT'S PAIN GOAL Description Less than 4  10/28/2017 1515 - Completed/Met by Brita Romp, RN

## 2017-10-28 NOTE — Progress Notes (Signed)
Recreational Therapy Discharge Summary Patient Details  Name: Bobby [name] MRN: [id number] Date of Birth: June 20, 1956 Today's Date: 10/28/2017 Comments on progress toward goals: Pt has made good progress toward goals and is ready for discharge home.  TR sessions focused on community reintegration emphasizing safety, identification and negotiation of obstacles, accessing public rest rooms and energy conservation techniques as well as activity analysis with potential modifications.  Pt is discharging at supervision ambulatory level for community pursuits.  Reasons for discharge: discharge from hospital  Patient/family agrees with progress made and goals achieved: Yes  [name] 10/28/2017, 8:21 AM

## 2017-10-28 NOTE — Progress Notes (Signed)
Pt has own mask and CPAP machine.  CPAP within pt reach.

## 2017-10-28 NOTE — Progress Notes (Signed)
Social Work Patient ID: Bobby [name], male   DOB: 02-12-1956, 62 y.o.   MRN: [id number]   CSW met with pt to update him on the team conference discussion and targeted d/c date of 10-29-17.  Pt is pleased with that and stated that his wife could come for family education 10-28-17 from 8-10am.  CSW and Leavy Cella, therapy supervisor worked on this for pt's schedule.  Pt feels prepared to go home and will need HH and DME, which CSW will arrange and order.  CSW remains available to assist as needed.

## 2017-10-29 MED ORDER — POLYETHYLENE GLYCOL 3350 17 G PO PACK: 17.0000 g | PACK | Freq: Two times a day (BID) | ORAL | 0 refills | Status: DC

## 2017-10-29 MED ORDER — TRAMADOL HCL 50 MG PO TABS: 50.0000 mg | ORAL_TABLET | Freq: Four times a day (QID) | ORAL | 0 refills | Status: DC | PRN

## 2017-10-29 MED ORDER — CYCLOBENZAPRINE HCL 5 MG PO TABS: 5.0000 mg | ORAL_TABLET | Freq: Three times a day (TID) | ORAL | 0 refills | Status: DC | PRN

## 2017-10-29 MED ORDER — ACETAMINOPHEN 325 MG PO TABS: 325.0000 mg | ORAL_TABLET | ORAL | Status: DC | PRN

## 2017-10-29 MED ORDER — FUROSEMIDE 20 MG PO TABS: 20.0000 mg | ORAL_TABLET | Freq: Two times a day (BID) | ORAL | 0 refills | Status: DC

## 2017-10-29 MED ORDER — GABAPENTIN 300 MG PO CAPS: 300.0000 mg | ORAL_CAPSULE | Freq: Three times a day (TID) | ORAL | 0 refills | Status: DC

## 2017-10-29 MED ORDER — SENNOSIDES-DOCUSATE SODIUM 8.6-50 MG PO TABS: 2.0000 | ORAL_TABLET | Freq: Every day | ORAL | 0 refills | Status: DC

## 2017-10-29 MED ORDER — OXYCODONE HCL 5 MG PO TABS: 5.0000 mg | ORAL_TABLET | ORAL | 0 refills | Status: DC | PRN

## 2017-10-29 MED ORDER — FERROUS SULFATE 325 (65 FE) MG PO TABS: 325.0000 mg | ORAL_TABLET | Freq: Two times a day (BID) | ORAL | 0 refills | Status: DC

## 2017-10-29 NOTE — Discharge Instructions (Signed)
Inpatient Rehab Discharge Instructions  Bobby [name] Discharge date and time:  10/29/17  Activities/Precautions/ Functional Status: Activity: no lifting, driving, or strenuous exercise  till cleared by MD Diet: regular diet Wound Care: keep wound clean and dry Contact MD if you develop any problems with your incision/wound--redness, swelling, increase in pain, drainage or if you develop fever or chills.    Functional statu s:  ___ No restrictions     ___ Walk up steps independently ___ 24/7 supervision/assistance   ___ Walk up steps with assistance _X__ Intermittent supervision/assistance  _X__ Bathe/dress independently ___ Walk with walker     ___ Bathe/dress with assistance ___ Walk Independently    ___ Shower independently ___ Walk with assistance    ___ Shower with assistance _X__ No alcohol     ___ Return to work/school ________   COMMUNITY REFERRALS UPON DISCHARGE:   Home Health:   PT     OT         Agency:  [facility] [employer]:  [phone] Medical Equipment/Items Ordered:  Rolling walker; shower seat  Agency/Supplier:  [facility]       Phone:  [phone]   Special Instructions: 1. No bending, twisting or arching. Do not lift items over 5 lbs   My questions have been answered and I understand these instructions. I will adhere to these goals and the provided educational materials after my discharge from the hospital.  Patient/Caregiver Signature _______________________________ Date __________  Clinician Signature _______________________________________ Date __________  Please bring this form and your medication list with you to all your follow-up doctor's appointments.

## 2017-10-29 NOTE — Progress Notes (Signed)
[facility] PHYSICAL MEDICINE & REHABILITATION     PROGRESS NOTE    Subjective/Complaints: Pt seen lying in bed this AM. He is ready for discharge.   ROS: Denies nausea, vomiting, diarrhea, shortness of breath or chest pain   Objective: Vital Signs: Blood pressure (!) 149/70, pulse 73, temperature 98.7 F (37.1 C), temperature source Oral, resp. rate 18, height 5\' 3"  (1.6 m), weight 106.2 kg (234 lb 2.1 oz), SpO2 100 %. No results found. Recent Labs    10/28/17 0448  WBC 8.4  HGB 8.2*  HCT 25.3*  PLT 332   Recent Labs    10/28/17 0448  NA 136  K 3.7  CL 101  GLUCOSE 126*  BUN 18  CREATININE 0.91  CALCIUM 8.3*   CBG (last 3)  No results for input(s): GLUCAP in the last 72 hours.  Wt Readings from Last 3 Encounters:  10/23/17 106.2 kg (234 lb 2.1 oz)  10/18/17 98 kg (216 lb 0.8 oz)  10/11/17 98 kg (216 lb 1.6 oz)    Physical Exam:  Constitutional: He appearswell-developedand well-nourished.No distress. HENT: Normocephalicand atraumatic.  Eyes:EOMare normal. No discharge.  Cardiovascular:RRR. No JVD. Respiratory:Effort normal and breath sounds normal.  GI: Bowel sounds are normal. He exhibitsno distension.  Musculoskeletal: LE edema. Scrotal edema, improving No tenderness.  Neurological: He isalertand oriented. Decreased insight and awareness.  Delays in processing, improving.  Motor: B/l Upper extremity strength grossly 5/5 proximal to distal.  B/l LE HF 4+/5, 4+/5 KE, 5/5 ADF/PF  Skin: Skin iswarmand dry. He isnot diaphoretic.Incision c/d/i. Psychiatric: He has anormal mood and affect. Hisbehavior is normal.Judgmentand thought contentnormal  Assessment/Plan: 1.  Functional deficits secondary to lumbar scoliosis with stenosis and radiculopathy which require 3+ hours per day of interdisciplinary therapy in a comprehensive inpatient rehab setting. Physiatrist is providing close team supervision and 24 hour management of active medical  problems listed below. Physiatrist and rehab team continue to assess barriers to discharge/monitor patient progress toward functional and medical goals.  Function:  Bathing Bathing position   Position: Shower  Bathing parts Body parts bathed by patient: Right lower leg, Right arm, Left arm, Chest, Abdomen, Front perineal area, Right upper leg, Left upper leg, Left lower leg, Back, Buttocks Body parts bathed by helper: Buttocks  Bathing assist Assist Level: Supervision or verbal cues      Upper Body Dressing/Undressing Upper body dressing   What is the patient wearing?: Pull over shirt/dress, Orthosis     Pull over shirt/dress - Perfomed by patient: Thread/unthread right sleeve, Thread/unthread left sleeve, Put head through opening, Pull shirt over trunk       Orthosis activity level: Performed by patient(with mod instructional cueing)  Upper body assist Assist Level: Supervision or verbal cues   Set up : To obtain clothing/put away, To apply TLSO, cervical collar  Lower Body Dressing/Undressing Lower body dressing   What is the patient wearing?: Pants, Non-skid slipper socks     Pants- Performed by patient: Thread/unthread right pants leg, Pull pants up/down, Thread/unthread left pants leg Pants- Performed by helper: Thread/unthread left pants leg Non-skid slipper socks- Performed by patient: Don/doff left sock, Don/doff right sock Non-skid slipper socks- Performed by helper: Don/doff right sock, Don/doff left sock                  Lower body assist Assist for lower body dressing: Supervision or verbal cues      Toileting Toileting   Toileting steps completed by patient: Adjust clothing prior to  toileting, Adjust clothing after toileting, Performs perineal hygiene Toileting steps completed by helper: Adjust clothing prior to toileting, Performs perineal hygiene, Adjust clothing after toileting Toileting Assistive Devices: Grab bar or rail  Toileting assist Assist  level: Supervision or verbal cues   Transfers Chair/bed transfer   Chair/bed transfer method: Ambulatory, Stand pivot Chair/bed transfer assist level: No Help, no cues, assistive device, takes more than a reasonable amount of time Chair/bed transfer assistive device: Orthosis, Medical sales representative     Max distance: 150 Assist level: No help, No cues, assistive device, takes more than a reasonable amount of time   Wheelchair   Type: Manual Max wheelchair distance: 150 ft Assist Level: Supervision or verbal cues  Cognition Comprehension Comprehension assist level: Follows complex conversation/direction with no assist  Expression Expression assist level: Expresses complex ideas: With no assist  Social Interaction Social Interaction assist level: Interacts appropriately with others - No medications needed.  Problem Solving Problem solving assist level: Solves complex problems: Recognizes & self-corrects  Memory Memory assist level: Complete Independence: No helper   Medical Problem List and Plan: 1.Functional mobility deficitssecondary to lumbar scoliosis with radiculopathy status post lumbar decompression and fusion with postoperative encephalopathy   D/c today  Will see patient for transitional care management in 1-2 weeks 2. DVT Prophylaxis/Anticoagulation: Mechanical:Sequential compression devices, below kneeBilateral lower extremities 3. Pain Management:Oxycodone prn effective. Continue Neurontin for neuropathy. 4. Mood:LCSW to follow for evaluation and support. 5. Neuropsych: This patientiscapable of making decisions on hisown behalf. 6. Skin/Wound Care:monitor incisions for healing. 7. Fluids/Electrolytes/Nutrition:Encourage p.o. intake.     BMP within acceptable range on 1/24 8. DXA:JOINOMV BP bid--continue Norvasc and HCTZ daily   Slightly elevated this AM, otherwise relatively controlled 9. EHM:CNOBSJGGE consistent use of CPAP. 10. ABLA:     Hemoglobin 8.2 on 1/24   Cont to monitor 11. Scrotal edema and LE edema:    Likely related to anterior incision and associated bruising and postoperative drainage.     Elevate    Lasix 20 BID started on 1/23 12. Hypoalbuminemia   Supplement initiated  13. Constipation   Bowel meds increased on 1/21   Improving  LOS (Days) 6 A FACE TO FACE EVALUATION WAS PERFORMED  Ankit Lorie Phenix, MD 10/29/2017 8:31 AM

## 2017-10-29 NOTE — Progress Notes (Signed)
Patient discharged home.  Left floor via wheelchair, escorted by nursing staff and family.  Patient and family verbalized understanding of discharge instructions as given by Algis Liming, PA.  All patient belongings sent with patient including DME and prescriptions.  Appears to be in no immediate distress at this time.  Brita Romp, RN

## 2017-10-29 NOTE — Discharge Summary (Signed)
Physician Discharge Summary  Patient ID: Bobby [name] MRN: [id number] DOB/AGE: [birth year] 62 y.o.  Admit date: 10/23/2017 Discharge date: 10/29/2017  Discharge Diagnoses:  Principal Problem:   Scoliosis of lumbar region due to degenerative disease of spine in adult Active Problems:   Benign essential HTN   Acute blood loss anemia   Scrotal edema   Hypoalbuminemia due to protein-calorie malnutrition (HCC)   Post-operative pain   Drug-induced constipation   Peripheral edema   Discharged Condition: stable   Significant Diagnostic Studies: N/A   Labs:  Basic Metabolic Panel: BMP Latest Ref Rng & Units 10/28/2017 10/24/2017 10/19/2017  Glucose 65 - 99 mg/dL 126(H) 123(H) 118(H)  BUN 6 - 20 mg/dL 18 13 -  Creatinine 0.61 - 1.24 mg/dL 0.91 0.98 -  BUN/Creat Ratio 9 - 20 - - -  Sodium 135 - 145 mmol/L 136 138 137  Potassium 3.5 - 5.1 mmol/L 3.7 3.8 4.1  Chloride 101 - 111 mmol/L 101 103 -  CO2 22 - 32 mmol/L 27 26 -  Calcium 8.9 - 10.3 mg/dL 8.3(L) 7.8(L) -    CBC: Recent Labs  Lab 10/24/17 0542 10/25/17 0538 10/28/17 0448  WBC 10.9* 10.0 8.4  NEUTROABS 7.7  --   --   HGB 8.3* 8.1* 8.2*  HCT 25.8* 25.3* 25.3*  MCV 90.2 88.8 91.3  PLT 202 197 332    CBG: No results for input(s): GLUCAP in the last 168 hours.  Brief HPI:   Bobby Medina Tineois a 62 y.o.malewith history of HTN, OSA, back pain with numbness LUE and left arm due to scoliosis due to DDD. He was admitted on 10/18/17 for two part anterolateral lumbar fusion from L5-S1 by Dr. Vertell Limber. Post op with lethargy, confusion and difficulty following simple commands to complete ADLS.He hashad incontinence of bowel and bladder per reports.Pain control was improving.CIR recommended due to functional decline   Hospital Course: BAYLEY HURN was admitted to rehab 10/23/2017 for inpatient therapies to consist of PT and OT at least three hours five days a week. Past admission physiatrist, therapy team and rehab RN have  worked together to provide customized collaborative inpatient rehab. Blood pressures were monitored on bid basis and have been controlled. Lasix was added to help manage peripheral and scrotal edema. Lytes have been monitored with recheck labs showing that renal status and potasium levels WNL. ABLA is stable on iron supplement. Bowel program was augmented to help manage opoid induced constipation.  Nutritional supplements were added due to low protein stores and to help promote healing. Pain control is improving and with decrease in use of narcotics. Back incision is intact and is healing well without signs of infection. He has made steady gains during his stay and is modified independent at discharge. . The [place] Substance controlled database was reviewed prior to prescribing narcotic pain medication to this patient. He will continue to receive follow up HHPT and [place] [facility] after discharge   Rehab course: During patient's stay in rehab team conference was held to monitor patient's progress, set goals and discuss barriers to discharge. At admission, patient required min to max assist with ADL tasks and mod assist with mobility. He has had improvement in activity tolerance, balance, postural control, as well as ability to compensate for deficits. He is able to complete ADL tasks with use of AE and supervision. He is modified independent for transfers and to ambulate 150' with RW. He requires supervision to navigate a ramp. Family education  was completed regarding all aspects of care and safety.     Disposition: 01-Home or Self Care   Diet: Regular  Special Instructions: 1. No bending, twisting, arching, lifting items over 5 lbs or driving till cleared by MD. 2. Wear back brace when out of bed. 3. Needs BMET and CBC rechecked in 1-2 weeks.  Discharge Instructions    Ambulatory referral to Physical Medicine Rehab   Complete by:  As directed    1-2 weeks transitional care      Allergies as of 10/29/2017   No Known Allergies     Medication List    STOP taking these medications   naproxen sodium 220 MG tablet Commonly known as:  ALEVE   tiZANidine 2 MG tablet Commonly known as:  ZANAFLEX   tiZANidine 4 MG tablet Commonly known as:  ZANAFLEX     TAKE these medications   acetaminophen 325 MG tablet Commonly known as:  TYLENOL Take 1-2 tablets (325-650 mg total) by mouth every 4 (four) hours as needed for mild pain.   amLODipine 2.5 MG tablet Commonly known as:  NORVASC Take 1 tablet (2.5 mg total) by mouth daily.   aspirin 81 MG tablet Take 81 mg by mouth daily.   atorvastatin 20 MG tablet Commonly known as:  LIPITOR TAKE 1 TABLET BY MOUTH  DAILY AT 6 PM. What changed:  See the new instructions.   cyclobenzaprine 5 MG tablet Commonly known as:  FLEXERIL Take 1 tablet (5 mg total) by mouth 3 (three) times daily as needed for muscle spasms.   ferrous sulfate 325 (65 FE) MG tablet Take 1 tablet (325 mg total) by mouth 2 (two) times daily with a meal.   furosemide 20 MG tablet Commonly known as:  LASIX Take 1 tablet (20 mg total) by mouth 2 (two) times daily.   gabapentin 300 MG capsule Commonly known as:  NEURONTIN Take 1 capsule (300 mg total) by mouth 3 (three) times daily.   hydrochlorothiazide 12.5 MG tablet Commonly known as:  HYDRODIURIL Take 1 tablet (12.5 mg total) by mouth daily.   oxyCODONE 5 MG immediate release tablet--Rx # 30 pills  Commonly known as:  Oxy IR/ROXICODONE Take 1 tablet (5 mg total) by mouth every 4 (four) hours as needed for severe pain.   polyethylene glycol packet Commonly known as:  MIRALAX / GLYCOLAX Take 17 g by mouth 2 (two) times daily.   senna-docusate 8.6-50 MG tablet Commonly known as:  Senokot-S Take 2 tablets by mouth at bedtime.   traMADol 50 MG tablet--Rx # 20 pills Commonly known as:  ULTRAM Take 1 tablet (50 mg total) by mouth every 6 (six) hours as needed for moderate pain or severe  pain.      Follow-up Information    Jamse Arn, MD Follow up.   Specialty:  Physical Medicine and Rehabilitation Why:  Office will call you with follow up appointment Contact information: [address] [place] [address] [phone]        Bobby Levine, MD. Call.   Specialty:  Neurosurgery Why:  call for follow up appointment Contact information: [address] [place] [address] [place] [address] [phone]        Leone Haven, MD. Call in 1 day(s).   Specialty:  Family Medicine Why:  for post hospital check in 2 weeks. Contact information: [place] [address] [phone]           Signed:  Bary Leriche 10/29/2017, 5:37 PM

## 2017-11-01 ENCOUNTER — Telehealth: Payer: Self-pay | Admitting: Registered Nurse

## 2017-11-01 NOTE — Telephone Encounter (Signed)
1st Attempt: Placed a call to Mr. Crate, no answer. Left message to return the call.

## 2017-11-01 NOTE — Telephone Encounter (Signed)
Patient discharged on 10/29/17 first attempt made for TCM.

## 2017-11-02 ENCOUNTER — Telehealth: Payer: Self-pay | Admitting: Registered Nurse

## 2017-11-02 NOTE — Telephone Encounter (Signed)
Transitional Care call Transitional Care Call Completed, Appointment Confirmed, Address Confirmed, New Patient Packet Mailed.  Transitional Care Questions  Patient name: Bobby [name] DOB: [birth year] 1. Are you/is patient experiencing any problems since coming home? No a. Are there any questions regarding any aspect of care? No 2. Are there any questions regarding medications administration/dosing? No a. Are meds being taken as prescribed? Yes b. "Patient should review meds with caller to confirm" Medication List Reviewed. 3. Have there been any falls? No 4. Has Home Health been to the house and/or have they contacted you? Yes: [facility] a. If not, have you tried to contact them? NA b. Can we help you contact them? No 5. Are bowels and bladder emptying properly? Yes a. Are there any unexpected incontinence issues? No b. If applicable, is patient following bowel/bladder programs? NA 6. Any fevers, problems with breathing, unexpected pain? No 7. Are there any skin problems or new areas of breakdown? No 8. Has the patient/family member arranged specialty MD follow up (ie cardiology/neurology/renal/surgical/etc.)?  Yes a. Can we help arrange? No 9. Does the patient need any other services or support that we can help arrange? No 10. Are caregivers following through as expected in assisting the patient? Yes 11. Has the patient quit smoking, drinking alcohol, or using drugs as recommended? Patients reports he does not smoke, drink alcohol or use illicit drugs.   Appointment date/time 11/05/2017, arrival time 01:30 pm  Appointment time 2:00 pm with Dr. Posey Pronto.  [place]

## 2017-11-02 NOTE — Progress Notes (Signed)
Social Work Discharge Note  The overall goal for the admission was met for:   Discharge location: Yes - home with wife  Length of Stay: Yes - 6 days  Discharge activity level: Yes - modified independent for PT; supervision for OT  Home/community participation: Yes  Services provided included: MD, RD, PT, OT, RN, Pharmacy and SW  Financial Services: Private Insurance: Theme park manager  Follow-up services arranged: Home Health: PT/OT from [facility], DME: rolling walker; tub seat with back from [place] and Patient/Family has no preference for HH/DME agencies  Comments (or additional information): Pt's wife came for family education and feels prepared to assist pt at home.  Pt is ready to go home.    Patient/Family verbalized understanding of follow-up arrangements: Yes  Individual responsible for coordination of the follow-up plan: pt with wife for support  Confirmed correct DME delivered: Trey Sailors 11/02/2017    Odilon Cass, Silvestre Mesi

## 2017-11-03 NOTE — Telephone Encounter (Signed)
Spinal surgery elective no TCM required.

## 2017-11-05 ENCOUNTER — Encounter: Payer: 59 | Attending: Physical Medicine & Rehabilitation | Admitting: Physical Medicine & Rehabilitation

## 2017-11-05 ENCOUNTER — Encounter: Payer: Self-pay | Admitting: Physical Medicine & Rehabilitation

## 2017-11-05 VITALS — BP 108/70 | HR 118

## 2017-11-05 DIAGNOSIS — Z713 Dietary counseling and surveillance: Secondary | ICD-10-CM | POA: Insufficient documentation

## 2017-11-05 DIAGNOSIS — R269 Unspecified abnormalities of gait and mobility: Secondary | ICD-10-CM

## 2017-11-05 DIAGNOSIS — G8918 Other acute postprocedural pain: Secondary | ICD-10-CM

## 2017-11-05 DIAGNOSIS — D62 Acute posthemorrhagic anemia: Secondary | ICD-10-CM

## 2017-11-05 DIAGNOSIS — M4156 Other secondary scoliosis, lumbar region: Secondary | ICD-10-CM | POA: Insufficient documentation

## 2017-11-05 DIAGNOSIS — R609 Edema, unspecified: Secondary | ICD-10-CM

## 2017-11-05 DIAGNOSIS — I10 Essential (primary) hypertension: Secondary | ICD-10-CM

## 2017-11-05 DIAGNOSIS — R000 Tachycardia, unspecified: Secondary | ICD-10-CM

## 2017-11-05 NOTE — Progress Notes (Signed)
Subjective:    Patient ID: Bobby [name], male    DOB: 14-Apr-1956, 62 y.o.   MRN: [id number]  HPI 62 y.o. male with history of HTN, OSA, back pain with numbness LUE and left arm due to scoliosis due to DDD presents for transitional care management after receiving CIR s/p lumber decompression with post-op encephalopathy.   Admit date: 10/23/2017 Discharge date: 10/29/2017  At discharge, he was instructed to wear his back brace when out of bed, but he is not wearing it today, stating that he could not get it on.  He does not have an appointment with Neurosurg or PCP.  BP is controlled, but HR is elevated; pt states he got lost and was running to come here. His edema has improved. Bowel movement have normalized.  He has not had labs drawn. Denies falls  Therapies: Is attempting to coordinate schedule DME: Shower stool Mobility: Cane in community  Pain Inventory Average Pain 4 Pain Right Now 4 My pain is dull  In the last 24 hours, has pain interfered with the following? General activity 7 Relation with others 5 Enjoyment of life 8 What TIME of day is your pain at its worst? morning Sleep (in general) Poor  Pain is worse with: walking and bending Pain improves with: medication Relief from Meds: 7  Mobility walk with assistance use a cane use a walker how many minutes can you walk? 5 ability to climb steps?  no do you drive?  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 Hypertension Mother   . Kidney disease Mother   . Hyperlipidemia Mother   . Colon cancer Father   . Pancreatitis Unknown   . Diabetes Other    Social History   Socioeconomic History  . Marital status: Married    Spouse name:  None  . Number of children: None  . Years of education: None  . Highest education level: None  Social Needs  . Financial resource strain: None  . Food insecurity - worry: None  . Food insecurity - inability: None  . Transportation needs - medical: None  . Transportation needs - non-medical: None  Occupational History  . None  Tobacco Use  . Smoking status: Former Smoker    Packs/day: 1.00    Years: 15.00    Pack years: 15.00    Types: Cigarettes, Cigars    Last attempt to quit: 08/24/2007    Years since quitting: 10.2  . Smokeless tobacco: Never Used  . Tobacco comment: " quit smoking around 2007"  Substance and Sexual Activity  . Alcohol use: Yes    Alcohol/week: 0.6 oz    Types: 1 Standard drinks or equivalent per week    Comment: occasional  . Drug use: No  . Sexual activity: None  Other Topics Concern  . None  Social History Narrative  . None   Past Surgical History:  Procedure Laterality Date  . ABDOMINAL EXPOSURE N/A 10/18/2017   Procedure: ABDOMINAL EXPOSURE;  Surgeon: Rosetta Posner, MD;  Location: [facility];  Service: Vascular;  Laterality: N/A;  Part-1  . ANTERIOR FUSION CERVICAL SPINE  2002   . ANTERIOR LATERAL LUMBAR FUSION 4 LEVELS Left 10/18/2017  Procedure: Left Lumbar One-Two,Lumbar Two-Three,Lumbar Three-Four Anterolateral lumbar interbody fusion;  Surgeon: Erline Levine, MD;  Location: [facility];  Service: Neurosurgery;  Laterality: Left;  anterolateral approach Part-2  . ANTERIOR LUMBAR FUSION N/A 10/18/2017   Procedure: Lumbar Five-Sacral One Anterior lumbar interbody fusion with Dr. Curt Jews;  Surgeon: Erline Levine, MD;  Location: [facility];  Service: Neurosurgery;  Laterality: N/A;  Part-1 Abdominal approach  . APPLICATION OF INTRAOPERATIVE CT SCAN N/A 10/19/2017   Procedure: APPLICATION OF INTRAOPERATIVE CT SCAN;  Surgeon: Erline Levine, MD;  Location: [facility];  Service: Neurosurgery;  Laterality: N/A;  APPLICATION OF INTRAOPERATIVE CT SCAN  . BACK SURGERY    .  COLONOSCOPY W/ BIOPSIES AND POLYPECTOMY    . LUMBAR LAMINECTOMY/DECOMPRESSION MICRODISCECTOMY Left 02/19/2016   Procedure: Laminectomy and Foraminotomy -Left Lumbar three-four, Lumbar four-five, Lumbar five-Sacral one ;  Surgeon: Ashok Pall, MD;  Location: [facility];  Service: Neurosurgery;  Laterality: Left;  . POSTERIOR LUMBAR FUSION 4 LEVEL N/A 10/19/2017   Procedure: Thoracic Eight to Pelvis fixation and pedicle screws  Transforaminal Lumbar Interbody Fusion Lumbar Four-Five with possible osteotomy;  Surgeon: Erline Levine, MD;  Location: [facility];  Service: Neurosurgery;  Laterality: N/A;  Thoracic Eight to Pelvis fixation and pedicle screws  Transforaminal Lumbar Interbody Fusion Lumbar Four-Five with possible osteotomy    Past Medical History:  Diagnosis Date  . Elevated liver enzymes   . GERD (gastroesophageal reflux disease)   . Heart murmur    as a child only; outgrew it  . History of kidney stones    last attack was in 2016  . Hypercholesterolemia   . Hypertension   . Osteoarthritis    spine  . Radiculopathy   . Scoliosis   . Sleep apnea    does not wear CPAP since weight loss  . Vitamin D deficiency   . Wears glasses    BP 108/70 (BP Location: Left Arm, Patient Position: Sitting, Cuff Size: Normal)   Pulse (!) 118   SpO2 97%   Opioid Risk Score:   Fall Risk Score:  `1  Depression screen PHQ 2/9  No flowsheet data found.  Review of Systems  Eyes: Negative.   Respiratory: Negative.   Cardiovascular: Negative.   Gastrointestinal: Negative.   Endocrine: Negative.   Genitourinary: Negative.   Musculoskeletal: Positive for back pain and gait problem.  Skin: Negative.   Allergic/Immunologic: Negative.   Neurological: Positive for numbness.  Hematological: Negative.   Psychiatric/Behavioral: Negative.   All other systems reviewed and are negative.     Objective:   Physical Exam Constitutional: He appears well-developed and well-nourished. No distress.    HENT: Normocephalic and atraumatic.  Eyes: EOM are normal. No discharge.  Cardiovascular: RRR. No JVD. Respiratory: Effort normal and breath sounds normal.  GI: Bowel sounds are normal. He exhibits no distension.  Musculoskeletal: LE edema, improving No tenderness.  Neurological: He is alert and oriented.  Decreased insight and awareness.  Delays in processing, improving.  Motor: B/l Upper extremity strength grossly 5/5 proximal to distal.  B/l LE HF 4/5, 4/5 KE, 5/5 ADF/PF (right stronger than left) Skin: Skin is warm and dry. He is not diaphoretic.  Psychiatric: He has a normal mood and affect. His behavior is normal. Judgment and thought content normal    Assessment & Plan:  62 y.o. male with history of HTN, OSA, back pain with numbness LUE and left arm due to scoliosis due to DDD presents for transitional care management after receiving CIR s/p  lumber decompression with post-op encephalopathy.   1. Functional mobility deficits secondary to lumbar scoliosis with radiculopathy status post lumbar decompression and fusion with postoperative encephalopathy  Pt to coordinate timing with therapies  Follow up with Neurosurg, needs appointment  Brace when out of bed, educated pt on importance of compliance  2. Pain Management  Improving  Educated on weaning tramadol/hydocodone  3. HTN/Tachycardia  BP controlled  HR elevated, however, pt states he was running to the office  4. ABLA:   Labs per PCP  Encouraged to call office for lab work if not able to schedule with PCP  5. OSA  Encourage compliance with CPAP, states he is getting used to it  6. LE edema:   Cont Lasix  Improving  Follow up with PCP  7. Gait abnormality  Cont therapies  Cont cane for safety  Meds reviewed Referrals reviewed - Needs Neurosurg, PCP appointment All questions answered

## 2017-11-09 ENCOUNTER — Telehealth: Payer: Self-pay

## 2017-11-09 DIAGNOSIS — Z4789 Encounter for other orthopedic aftercare: Secondary | ICD-10-CM | POA: Diagnosis not present

## 2017-11-09 DIAGNOSIS — M4186 Other forms of scoliosis, lumbar region: Secondary | ICD-10-CM | POA: Diagnosis not present

## 2017-11-09 DIAGNOSIS — M5136 Other intervertebral disc degeneration, lumbar region: Secondary | ICD-10-CM | POA: Diagnosis not present

## 2017-11-09 NOTE — Telephone Encounter (Signed)
Keisa RN [facility] called requesting a call back. Called her back and she informed me that a Physical Therapist will be on site with patient for an initial visit.

## 2017-11-10 ENCOUNTER — Encounter: Payer: Self-pay | Admitting: Family Medicine

## 2017-11-10 ENCOUNTER — Ambulatory Visit: Payer: 59 | Admitting: Family Medicine

## 2017-11-10 VITALS — BP 110/80 | HR 107 | Temp 98.1°F | Wt 206.6 lb

## 2017-11-10 DIAGNOSIS — N5089 Other specified disorders of the male genital organs: Secondary | ICD-10-CM

## 2017-11-10 DIAGNOSIS — M4125 Other idiopathic scoliosis, thoracolumbar region: Secondary | ICD-10-CM | POA: Diagnosis not present

## 2017-11-10 DIAGNOSIS — R7309 Other abnormal glucose: Secondary | ICD-10-CM | POA: Diagnosis not present

## 2017-11-10 DIAGNOSIS — D62 Acute posthemorrhagic anemia: Secondary | ICD-10-CM

## 2017-11-10 DIAGNOSIS — D649 Anemia, unspecified: Secondary | ICD-10-CM | POA: Diagnosis not present

## 2017-11-10 NOTE — Assessment & Plan Note (Addendum)
Status post surgery for this.  He is done quite well.  Appears to be healing well.  No pain now.  Neurologically intact in his lower extremities.  He will follow-up with his surgeon as planned.  He will continue to monitor symptoms.  He will monitor his incisions develops any signs of infection or hernia he will be reevaluated.

## 2017-11-10 NOTE — Assessment & Plan Note (Signed)
Related to recent surgery.  We plan to recheck CBC today.

## 2017-11-10 NOTE — Patient Instructions (Addendum)
Nice to see you. I am glad you are doing well after back surgery. Please see her surgeon as planned. We will check lab work today and contact you with the results. You can discontinue your Lasix at this time. If you develop any pain at the sites of your incisions or any new symptoms please be reevaluated.

## 2017-11-10 NOTE — Assessment & Plan Note (Signed)
Noted on labs from the hospital.  Check an A1c today.

## 2017-11-10 NOTE — Progress Notes (Signed)
[name], MD Phone: [phone]  Bobby [name] is a 62 y.o. male who presents today for hospital follow-up.  Patient seen for hospital follow-up after back surgery for scoliosis.  Hospital course with what sounds to be some delirium and lack of completing his own ADLs.  He went to rehab for about a week given his issues following surgery.  He did physical therapy and occupational therapy there.  Notes he has done well since getting out of rehab.  He has not required any pain medications.  He is not sore anymore.  Continuing on gabapentin for lower extremity nerve pain.  They placed him on Lasix for lower extremity and scrotal edema.  The edema has gone away though he continues the Lasix.  He notes no numbness, weakness, bowel or bladder incontinence, or saddle anesthesia.  No foot drop.  He continues home health PT.  His incision has been looking good.  He has a left lower quadrant incision as well with likely underlying scar tissue.  No signs of hernia.  He was anemic in the hospital.  His blood sugar was slightly elevated as well.  He needs recheck of a BMET and a CBC today.  He follows up with his surgeon in 3-4 weeks.  Social History   Tobacco Use  Smoking Status Former Smoker  . Packs/day: 1.00  . Years: 15.00  . Pack years: 15.00  . Types: Cigarettes, Cigars  . Last attempt to quit: 08/24/2007  . Years since quitting: 10.2  Smokeless Tobacco Never Used  Tobacco Comment   " quit smoking around 2007"     ROS see history of present illness  Objective  Physical Exam Vitals:   11/10/17 1010  BP: 110/80  Pulse: (!) 107  Temp: 98.1 F (36.7 C)  SpO2: 99%    BP Readings from Last 3 Encounters:  11/10/17 110/80  11/05/17 108/70  10/29/17 (!) 149/70   Wt Readings from Last 3 Encounters:  11/10/17 206 lb 9.6 oz (93.7 kg)  10/23/17 234 lb 2.1 oz (106.2 kg)  10/18/17 216 lb 0.8 oz (98 kg)    Physical Exam  Constitutional: No distress.  Cardiovascular: Regular  rhythm and normal heart sounds. Tachycardia present.  Pulmonary/Chest: Effort normal and breath sounds normal.  Musculoskeletal: He exhibits no edema.  Midline scar over her spinal column appears to be well-healing with no signs of infection, left lower quadrant scar appears to be well-healing as well with no erythema or warmth or tenderness, underlying scar tissue palpated, no obvious signs of hernia  Neurological: He is alert. Gait normal.  5/5 strength bilateral quads, hamstrings, plantar flexion, and dorsiflexion, sensation to light touch intact bilateral lower extremities  Skin: Skin is warm and dry. He is not diaphoretic.     Assessment/Plan: Please see individual problem list.  Scoliosis of thoracolumbar spine Status post surgery for this.  He is done quite well.  Appears to be healing well.  No pain now.  Neurologically intact in his lower extremities.  He will follow-up with his surgeon as planned.  He will continue to monitor symptoms.  He will monitor his incisions develops any signs of infection or hernia he will be reevaluated.  Scrotal edema This has resolved.  Discussed discontinuing Lasix.  He will monitor his blood pressure with coming off of the Lasix.  Plan to check BMP today.  Acute blood loss anemia Related to recent surgery.  We plan to recheck CBC today.  Elevated glucose Noted on labs from  the hospital.  Check an A1c today.   Orders Placed This Encounter  Procedures  . CBC  . Basic Metabolic Panel (BMET)  . HgB A1c    No orders of the defined types were placed in this encounter.    [name], MD [place]

## 2017-11-10 NOTE — Assessment & Plan Note (Addendum)
This has resolved.  Discussed discontinuing Lasix.  He will monitor his blood pressure with coming off of the Lasix.  Plan to check BMP today.

## 2017-11-11 LAB — HEMOGLOBIN A1C
Est. average glucose Bld gHb Est-mCnc: 108 mg/dL
Hgb A1c MFr Bld: 5.4 % (ref 4.8–5.6)

## 2017-11-11 LAB — BASIC METABOLIC PANEL
BUN/Creatinine Ratio: 19 (ref 10–24)
BUN: 19 mg/dL (ref 8–27)
CO2: 22 mmol/L (ref 20–29)
CREATININE: 1 mg/dL (ref 0.76–1.27)
Calcium: 9.9 mg/dL (ref 8.6–10.2)
Chloride: 103 mmol/L (ref 96–106)
GFR calc non Af Amer: 81 mL/min/{1.73_m2} (ref >59)
GFR, EST AFRICAN AMERICAN: 93 mL/min/{1.73_m2} (ref >59)
Glucose: 102 mg/dL — ABNORMAL HIGH (ref 65–99)
Potassium: 4.7 mmol/L (ref 3.5–5.2)
Sodium: 141 mmol/L (ref 134–144)

## 2017-11-11 LAB — CBC
HEMOGLOBIN: 11.8 g/dL — AB (ref 13.0–17.7)
Hematocrit: 36.9 % — ABNORMAL LOW (ref 37.5–51.0)
MCH: 28.5 pg (ref 26.6–33.0)
MCHC: 32 g/dL (ref 31.5–35.7)
MCV: 89 fL (ref 79–97)
Platelets: 445 10*3/uL — ABNORMAL HIGH (ref 150–379)
RBC: 4.14 x10E6/uL (ref 4.14–5.80)
RDW: 15.1 % (ref 12.3–15.4)
WBC: 8.3 10*3/uL (ref 3.4–10.8)

## 2017-11-12 ENCOUNTER — Telehealth: Payer: Self-pay | Admitting: Family Medicine

## 2017-11-12 DIAGNOSIS — M5136 Other intervertebral disc degeneration, lumbar region: Secondary | ICD-10-CM | POA: Diagnosis not present

## 2017-11-12 DIAGNOSIS — M4186 Other forms of scoliosis, lumbar region: Secondary | ICD-10-CM | POA: Diagnosis not present

## 2017-11-12 DIAGNOSIS — Z4789 Encounter for other orthopedic aftercare: Secondary | ICD-10-CM | POA: Diagnosis not present

## 2017-11-12 NOTE — Telephone Encounter (Signed)
Verbal orders can be given. 

## 2017-11-12 NOTE — Telephone Encounter (Signed)
Left message with verbal orders 

## 2017-11-12 NOTE — Telephone Encounter (Signed)
Please advise 

## 2017-11-12 NOTE — Telephone Encounter (Signed)
Copied from [place]. Topic: Inquiry >> Nov 12, 2017  2:02 PM Oliver Pila B wrote: Reason for CRM: [facility] called for orders for home health physical therapy 2x a week for 3 weeks

## 2017-11-15 DIAGNOSIS — M5136 Other intervertebral disc degeneration, lumbar region: Secondary | ICD-10-CM | POA: Diagnosis not present

## 2017-11-15 DIAGNOSIS — Z4789 Encounter for other orthopedic aftercare: Secondary | ICD-10-CM | POA: Diagnosis not present

## 2017-11-15 DIAGNOSIS — M4186 Other forms of scoliosis, lumbar region: Secondary | ICD-10-CM | POA: Diagnosis not present

## 2017-11-18 DIAGNOSIS — M5136 Other intervertebral disc degeneration, lumbar region: Secondary | ICD-10-CM | POA: Diagnosis not present

## 2017-11-18 DIAGNOSIS — Z4789 Encounter for other orthopedic aftercare: Secondary | ICD-10-CM | POA: Diagnosis not present

## 2017-11-18 DIAGNOSIS — M4186 Other forms of scoliosis, lumbar region: Secondary | ICD-10-CM | POA: Diagnosis not present

## 2017-11-23 DIAGNOSIS — M4186 Other forms of scoliosis, lumbar region: Secondary | ICD-10-CM | POA: Diagnosis not present

## 2017-11-23 DIAGNOSIS — M5136 Other intervertebral disc degeneration, lumbar region: Secondary | ICD-10-CM | POA: Diagnosis not present

## 2017-11-23 DIAGNOSIS — Z4789 Encounter for other orthopedic aftercare: Secondary | ICD-10-CM | POA: Diagnosis not present

## 2017-11-25 DIAGNOSIS — Z4789 Encounter for other orthopedic aftercare: Secondary | ICD-10-CM | POA: Diagnosis not present

## 2017-11-25 DIAGNOSIS — M5136 Other intervertebral disc degeneration, lumbar region: Secondary | ICD-10-CM | POA: Diagnosis not present

## 2017-11-25 DIAGNOSIS — M4186 Other forms of scoliosis, lumbar region: Secondary | ICD-10-CM | POA: Diagnosis not present

## 2017-12-02 ENCOUNTER — Encounter: Payer: 59 | Admitting: Physical Medicine & Rehabilitation

## 2017-12-02 ENCOUNTER — Encounter: Payer: Self-pay | Admitting: Physical Medicine & Rehabilitation

## 2017-12-02 VITALS — BP 116/83 | HR 82

## 2017-12-02 DIAGNOSIS — M4156 Other secondary scoliosis, lumbar region: Secondary | ICD-10-CM

## 2017-12-02 DIAGNOSIS — R609 Edema, unspecified: Secondary | ICD-10-CM | POA: Diagnosis not present

## 2017-12-02 DIAGNOSIS — Z713 Dietary counseling and surveillance: Secondary | ICD-10-CM | POA: Diagnosis not present

## 2017-12-02 DIAGNOSIS — G8918 Other acute postprocedural pain: Secondary | ICD-10-CM | POA: Diagnosis not present

## 2017-12-02 DIAGNOSIS — I10 Essential (primary) hypertension: Secondary | ICD-10-CM | POA: Diagnosis not present

## 2017-12-02 DIAGNOSIS — R269 Unspecified abnormalities of gait and mobility: Secondary | ICD-10-CM | POA: Diagnosis not present

## 2017-12-02 MED ORDER — TRAMADOL HCL 50 MG PO TABS: 50.0000 mg | ORAL_TABLET | Freq: Every day | ORAL | 0 refills | Status: DC | PRN

## 2017-12-02 NOTE — Progress Notes (Signed)
Subjective:    Patient ID: Bobby [name], male    DOB: 1956-03-30, 62 y.o.   MRN: [id number]  HPI 62 y.o. male with history of HTN, OSA, back pain with numbness LUE and left arm due to scoliosis due to DDD presents for follow up for s/p lumber decompression with post-op encephalopathy.   Last clinic visit 11/05/17.  Since that time, pt states he started and completed therapies.  He is doing HEP for the most part.  He sees Neurosurg on Monday. He is wearing his brace.  BP is improved. He saw his PCP and labs drawn.  He is tolerating CPAP.  He had a fall few weeks ago trying to pick up mail that fell on the ground.    Pain Inventory Average Pain 4 Pain Right Now 2 My pain is dull  In the last 24 hours, has pain interfered with the following? General activity 7 Relation with others 3 Enjoyment of life 8 What TIME of day is your pain at its worst? morning Sleep (in general) Poor  Pain is worse with: walking and bending Pain improves with: medication Relief from Meds: 7  Mobility walk with assistance use a cane use a walker how many minutes can you walk? 5 ability to climb steps?  no do you drive?  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 Hypertension Mother   . Kidney disease Mother   . Hyperlipidemia Mother   . Colon cancer Father   . Pancreatitis Unknown   . Diabetes Other    Social History   Socioeconomic History  . Marital status: Married    Spouse name: None  . Number of children: None  . Years of education: None  . Highest education level: None  Social Needs  . Financial resource strain: None  . Food insecurity - worry: None  . Food insecurity - inability: None  . Transportation  needs - medical: None  . Transportation needs - non-medical: None  Occupational History  . None  Tobacco Use  . Smoking status: Former Smoker    Packs/day: 1.00    Years: 15.00    Pack years: 15.00    Types: Cigarettes, Cigars    Last attempt to quit: 08/24/2007    Years since quitting: 10.2  . Smokeless tobacco: Never Used  . Tobacco comment: " quit smoking around 2007"  Substance and Sexual Activity  . Alcohol use: Yes    Alcohol/week: 0.6 oz    Types: 1 Standard drinks or equivalent per week    Comment: occasional  . Drug use: No  . Sexual activity: None  Other Topics Concern  . None  Social History Narrative  . None   Past Surgical History:  Procedure Laterality Date  . ABDOMINAL EXPOSURE N/A 10/18/2017   Procedure: ABDOMINAL EXPOSURE;  Surgeon: Rosetta Posner, MD;  Location: [facility];  Service: Vascular;  Laterality: N/A;  Part-1  . ANTERIOR FUSION CERVICAL SPINE  2002   . ANTERIOR LATERAL LUMBAR FUSION 4 LEVELS Left 10/18/2017   Procedure: Left Lumbar One-Two,Lumbar Two-Three,Lumbar Three-Four Anterolateral lumbar interbody fusion;  Surgeon: Erline Levine, MD;  Location: [facility];  Service: Neurosurgery;  Laterality: Left;  anterolateral approach Part-2  .  ANTERIOR LUMBAR FUSION N/A 10/18/2017   Procedure: Lumbar Five-Sacral One Anterior lumbar interbody fusion with Dr. Curt Jews;  Surgeon: Erline Levine, MD;  Location: [facility];  Service: Neurosurgery;  Laterality: N/A;  Part-1 Abdominal approach  . APPLICATION OF INTRAOPERATIVE CT SCAN N/A 10/19/2017   Procedure: APPLICATION OF INTRAOPERATIVE CT SCAN;  Surgeon: Erline Levine, MD;  Location: [facility];  Service: Neurosurgery;  Laterality: N/A;  APPLICATION OF INTRAOPERATIVE CT SCAN  . BACK SURGERY    . COLONOSCOPY W/ BIOPSIES AND POLYPECTOMY    . LUMBAR LAMINECTOMY/DECOMPRESSION MICRODISCECTOMY Left 02/19/2016   Procedure: Laminectomy and Foraminotomy -Left Lumbar three-four, Lumbar four-five, Lumbar five-Sacral one ;  Surgeon: Ashok Pall, MD;  Location: [facility];  Service: Neurosurgery;  Laterality: Left;  . POSTERIOR LUMBAR FUSION 4 LEVEL N/A 10/19/2017   Procedure: Thoracic Eight to Pelvis fixation and pedicle screws  Transforaminal Lumbar Interbody Fusion Lumbar Four-Five with possible osteotomy;  Surgeon: Erline Levine, MD;  Location: [facility];  Service: Neurosurgery;  Laterality: N/A;  Thoracic Eight to Pelvis fixation and pedicle screws  Transforaminal Lumbar Interbody Fusion Lumbar Four-Five with possible osteotomy    Past Medical History:  Diagnosis Date  . Elevated liver enzymes   . GERD (gastroesophageal reflux disease)   . Heart murmur    as a child only; outgrew it  . History of kidney stones    last attack was in 2016  . Hypercholesterolemia   . Hypertension   . Osteoarthritis    spine  . Radiculopathy   . Scoliosis   . Sleep apnea    does not wear CPAP since weight loss  . Vitamin D deficiency   . Wears glasses    BP 116/83   Pulse 82   SpO2 98%   Opioid Risk Score:   Fall Risk Score:  `1  Depression screen PHQ 2/9  Depression screen PHQ 2/9 11/10/2017  Decreased Interest 0  Down, Depressed, Hopeless 0  PHQ - 2 Score 0    Review of Systems  Eyes: Negative.   Respiratory: Negative.   Cardiovascular: Negative.   Gastrointestinal: Negative.   Endocrine: Negative.   Genitourinary: Negative.   Musculoskeletal: Positive for back pain and gait problem.  Skin: Negative.   Allergic/Immunologic: Negative.   Neurological: Positive for weakness and numbness.  Hematological: Negative.   Psychiatric/Behavioral: Negative.   All other systems reviewed and are negative.     Objective:   Physical Exam Constitutional: He appears well-developed and well-nourished. No distress.  HENT: Normocephalic and atraumatic.  Eyes: EOM are normal. No discharge.  Cardiovascular: RRR. No JVD. Respiratory: Effort normal and breath sounds normal.  GI: Bowel sounds are normal. He exhibits no distension.    Musculoskeletal: LE edema, improving No tenderness.  Neurological: He is alert and oriented.  Decreased insight and awareness.  Delays in processing, improving.  Motor: B/l Upper extremity strength grossly 5/5 proximal to distal.  B/l LE HF 4-4+/5, 4-4+/5 KE, 5/5 ADF/PF  Skin: Skin is warm and dry. He is not diaphoretic.  Psychiatric: He has a normal mood and affect. His behavior is normal. Judgment and thought content normal    Assessment & Plan:  62 y.o. male with history of HTN, OSA, back pain with numbness LUE and left arm due to scoliosis due to DDD presents for follow up for s/p lumber decompression with post-op encephalopathy.   1. Functional mobility deficits secondary to lumbar scoliosis with radiculopathy status post lumbar decompression and fusion with postoperative encephalopathy  Cont  HEP, completed therapies  Follow up with Neurosurg, appointment Monday  Brace when out of bed  2. Pain Management  Improving  Cont to wean tramadol, will order 30 tabs with no plans for further medication  Encouraged OTC lidoderm patches  3. LE edema  States his PCP is managing now  4. Gait abnormality  With recent fall  Educated on safety

## 2017-12-06 ENCOUNTER — Encounter: Payer: 59 | Admitting: Family Medicine

## 2017-12-06 DIAGNOSIS — M5416 Radiculopathy, lumbar region: Secondary | ICD-10-CM | POA: Diagnosis not present

## 2017-12-15 ENCOUNTER — Other Ambulatory Visit: Payer: Self-pay

## 2017-12-15 DIAGNOSIS — I10 Essential (primary) hypertension: Secondary | ICD-10-CM

## 2017-12-15 MED ORDER — HYDROCHLOROTHIAZIDE 12.5 MG PO TABS: 12.5000 mg | ORAL_TABLET | Freq: Every day | ORAL | 3 refills | Status: DC

## 2017-12-15 MED ORDER — AMLODIPINE BESYLATE 2.5 MG PO TABS: 2.5000 mg | ORAL_TABLET | Freq: Every day | ORAL | 3 refills | Status: DC

## 2017-12-15 NOTE — Telephone Encounter (Signed)
Last OV 11/10/17 last filled  Tramadol 12/02/17 30 0rf Hydrochlorothiazide Gregary Signs Jordsmeier 09/20/17 09/20/17 90 3rf Amlodipine by Delano Metz 09/20/17 90 3rf

## 2017-12-15 NOTE — Telephone Encounter (Signed)
BP meds refilled. Tramadol not refilled.  It appears the plan was to wean off the tramadol following his most recent visit with physical medicine and rehab.  It looks like this was not to be refilled moving forward per their note.  Please see if he has continued to need this and why.  Thanks.

## 2017-12-17 NOTE — Telephone Encounter (Signed)
Patient notified and states he does not need the tramadol

## 2018-01-17 DIAGNOSIS — M4150 Other secondary scoliosis, site unspecified: Secondary | ICD-10-CM | POA: Diagnosis not present

## 2018-02-02 ENCOUNTER — Ambulatory Visit: Payer: 59 | Admitting: Physical Medicine & Rehabilitation

## 2018-02-08 ENCOUNTER — Other Ambulatory Visit: Payer: Self-pay

## 2018-02-08 ENCOUNTER — Encounter: Payer: Self-pay | Admitting: Family Medicine

## 2018-02-08 ENCOUNTER — Ambulatory Visit (INDEPENDENT_AMBULATORY_CARE_PROVIDER_SITE_OTHER): Payer: 59 | Admitting: Family Medicine

## 2018-02-08 VITALS — BP 130/70 | HR 67 | Temp 97.6°F | Ht 64.0 in | Wt 211.4 lb

## 2018-02-08 DIAGNOSIS — Z0001 Encounter for general adult medical examination with abnormal findings: Secondary | ICD-10-CM | POA: Diagnosis not present

## 2018-02-08 DIAGNOSIS — D62 Acute posthemorrhagic anemia: Secondary | ICD-10-CM

## 2018-02-08 DIAGNOSIS — I10 Essential (primary) hypertension: Secondary | ICD-10-CM | POA: Diagnosis not present

## 2018-02-08 DIAGNOSIS — Z125 Encounter for screening for malignant neoplasm of prostate: Secondary | ICD-10-CM

## 2018-02-08 DIAGNOSIS — E785 Hyperlipidemia, unspecified: Secondary | ICD-10-CM | POA: Diagnosis not present

## 2018-02-08 DIAGNOSIS — Z1211 Encounter for screening for malignant neoplasm of colon: Secondary | ICD-10-CM

## 2018-02-08 DIAGNOSIS — H9313 Tinnitus, bilateral: Secondary | ICD-10-CM | POA: Diagnosis not present

## 2018-02-08 DIAGNOSIS — Z1329 Encounter for screening for other suspected endocrine disorder: Secondary | ICD-10-CM | POA: Diagnosis not present

## 2018-02-08 NOTE — Assessment & Plan Note (Signed)
Well-controlled on recheck.  Encouraged him to start checking at home.

## 2018-02-08 NOTE — Assessment & Plan Note (Signed)
Had improved quite a bit.  Needs recheck today.

## 2018-02-08 NOTE — Assessment & Plan Note (Signed)
Physical exam completed.  Encouraged to increase activity level slowly.  We will continue to monitor his diet and try to decrease his soda intake.  Referral to GI for colonoscopy.  We will request colonoscopy records as well.  Discussed prostate cancer screening with risks and benefits of testing.  He opted for PSA though declined digital rectal exam.  Encouraged him to see a dentist twice yearly.  Lab work as outlined below.

## 2018-02-08 NOTE — Patient Instructions (Signed)
Nice to see you. Please try to cut down on the diet Pepsi. Please try to slowly increase her exercise. We referred you to GI and ENT. Please try to start checking your blood pressure at home several times a week.

## 2018-02-08 NOTE — Assessment & Plan Note (Signed)
Refer to ENT

## 2018-02-08 NOTE — Progress Notes (Signed)
Bobby Rumps, MD Phone: [phone]  Bobby [name] is a 62 y.o. male who presents today for cpe.  Does some walking for exercise.  Wants to get back to lifting light weights as he has been released from his back surgery to do this. He eats pretty regular food.  He has increase his fruits and vegetables.  Not much meat.  He drinks one diet Pepsi per day. He thinks his last colonoscopy was >5 years ago.  Noted they advised 5-year recall.  He does have a family history of colon cancer in his father. Tetanus vaccination up-to-date. Hepatitis C and HIV testing up-to-date. No family history of prostate cancer though he is willing to undergo PSA testing. No tobacco use or illicit drug use.  Rare alcohol use. He does not see a dentist.  He sees ophthalmology once yearly. He reports some bilateral tinnitus over the last year.  Notes it is louder now than it was previously.  Occurs all the time.  No trouble hearing.  No ear fullness.  Active Ambulatory Problems    Diagnosis Date Noted  . Foot drop, left 11/29/2015  . Encounter for general adult medical examination with abnormal findings 11/29/2015  . Osteoarthritis of spine with radiculopathy, lumbar region 02/19/2016  . Neck pain 01/08/2017  . Scoliosis of thoracolumbar spine 10/18/2017  . Scoliosis of lumbar region due to degenerative disease of spine in adult 10/23/2017  . Benign essential HTN   . Acute blood loss anemia   . Scrotal edema   . Hypoalbuminemia due to protein-calorie malnutrition ([place])   . Post-operative pain   . Drug-induced constipation   . Peripheral edema   . Elevated glucose 11/10/2017  . Tinnitus of both ears 02/08/2018   Resolved Ambulatory Problems    Diagnosis Date Noted  . Hypertension, essential 08/30/2017   Past Medical History:  Diagnosis Date  . Elevated liver enzymes   . GERD (gastroesophageal reflux disease)   . Heart murmur   . History of kidney stones   . Hypercholesterolemia   .  Hypertension   . Osteoarthritis   . Radiculopathy   . Scoliosis   . Sleep apnea   . Vitamin D deficiency   . Wears glasses     Family History  Problem Relation Age of Onset  . Hypertension Mother   . Kidney disease Mother   . Hyperlipidemia Mother   . Colon cancer Father   . Pancreatitis Unknown   . Diabetes Other     Social History   Socioeconomic History  . Marital status: Married    Spouse name: Not on file  . Number of children: Not on file  . Years of education: Not on file  . Highest education level: Not on file  Occupational History  . Not on file  Social Needs  . Financial resource strain: Not on file  . Food insecurity:    Worry: Not on file    Inability: Not on file  . Transportation needs:    Medical: Not on file    Non-medical: Not on file  Tobacco Use  . Smoking status: Former Smoker    Packs/day: 1.00    Years: 15.00    Pack years: 15.00    Types: Cigarettes, Cigars    Last attempt to quit: 08/24/2007    Years since quitting: 10.4  . Smokeless tobacco: Never Used  . Tobacco comment: " quit smoking around 2007"  Substance and Sexual Activity  . Alcohol use: Yes  Alcohol/week: 0.6 oz    Types: 1 Standard drinks or equivalent per week    Comment: occasional  . Drug use: No  . Sexual activity: Not on file  Lifestyle  . Physical activity:    Days per week: Not on file    Minutes per session: Not on file  . Stress: Not on file  Relationships  . Social connections:    Talks on phone: Not on file    Gets together: Not on file    Attends religious service: Not on file    Active member of club or organization: Not on file    Attends meetings of clubs or organizations: Not on file    Relationship status: Not on file  . Intimate partner violence:    Fear of current or ex partner: Not on file    Emotionally abused: Not on file    Physically abused: Not on file    Forced sexual activity: Not on file  Other Topics Concern  . Not on file    Social History Narrative  . Not on file    ROS  General:  Negative for nexplained weight loss, fever Skin: Negative for new or changing mole, sore that won't heal HEENT: Positive for tinnitus, negative for trouble hearing, ringing in ears, mouth sores, hoarseness, change in voice, dysphagia. CV:  Negative for chest pain, dyspnea, edema, palpitations Resp: Negative for cough, dyspnea, hemoptysis GI: Negative for nausea, vomiting, diarrhea, constipation, abdominal pain, melena, hematochezia. GU: Negative for dysuria, incontinence, urinary hesitance, hematuria, vaginal or penile discharge, polyuria, sexual difficulty, lumps in testicle or breasts MSK: Negative for muscle cramps or aches, joint pain or swelling Neuro: Negative for headaches, weakness, numbness, dizziness, passing out/fainting Psych: Negative for depression, anxiety, memory problems  Objective  Physical Exam Vitals:   02/08/18 0817 02/08/18 0852  BP: 136/90 130/70  Pulse: 67   Temp: 97.6 F (36.4 C)   SpO2: 98%     BP Readings from Last 3 Encounters:  02/08/18 130/70  12/02/17 116/83  11/10/17 110/80   Wt Readings from Last 3 Encounters:  02/08/18 211 lb 6.4 oz (95.9 kg)  11/10/17 206 lb 9.6 oz (93.7 kg)  10/23/17 234 lb 2.1 oz (106.2 kg)    Physical Exam  Constitutional: No distress.  HENT:  Head: Normocephalic and atraumatic.  Mouth/Throat: Oropharynx is clear and moist.  Normal TMs bilaterally  Eyes: Pupils are equal, round, and reactive to light. Conjunctivae are normal.  Neck: Neck supple.  Cardiovascular: Normal rate, regular rhythm and normal heart sounds.  Pulmonary/Chest: Effort normal and breath sounds normal.  Abdominal: Soft. Bowel sounds are normal. He exhibits no distension and no mass. There is no tenderness. There is no rebound and no guarding.  Musculoskeletal: He exhibits no edema.  Lymphadenopathy:    He has no cervical adenopathy.  Neurological: He is alert.  Skin: Skin is warm  and dry. He is not diaphoretic.  Psychiatric: He has a normal mood and affect.     Assessment/Plan:   Encounter for general adult medical examination with abnormal findings Physical exam completed.  Encouraged to increase activity level slowly.  We will continue to monitor his diet and try to decrease his soda intake.  Referral to GI for colonoscopy.  We will request colonoscopy records as well.  Discussed prostate cancer screening with risks and benefits of testing.  He opted for PSA though declined digital rectal exam.  Encouraged him to see a dentist twice yearly.  Lab work  as outlined below.  Acute blood loss anemia Had improved quite a bit.  Needs recheck today.  Tinnitus of both ears Refer to ENT.  Benign essential HTN Well-controlled on recheck.  Encouraged him to start checking at home.  Patient will contact us in 2 weeks if he has not heard anything regarding the referrals.  Orders Placed This Encounter  Procedures  . CBC  . Comp Met (CMET)  . Lipid panel  . TSH  . PSA  . Ambulatory referral to Gastroenterology    Referral Priority:   Routine    Referral Type:   Consultation    Referral Reason:   Specialty Services Required    Number of Visits Requested:   1  . Ambulatory referral to ENT    Referral Priority:   Routine    Referral Type:   Consultation    Referral Reason:   Specialty Services Required    Requested Specialty:   Otolaryngology    Number of Visits Requested:   1    No orders of the defined types were placed in this encounter.    Bobby Rumps, MD [place]

## 2018-02-09 LAB — COMPREHENSIVE METABOLIC PANEL
ALBUMIN: 4.4 g/dL (ref 3.6–4.8)
ALK PHOS: 97 IU/L (ref 39–117)
ALT: 28 IU/L (ref 0–44)
AST: 25 IU/L (ref 0–40)
Albumin/Globulin Ratio: 1.8 (ref 1.2–2.2)
BUN / CREAT RATIO: 15 (ref 10–24)
BUN: 14 mg/dL (ref 8–27)
Bilirubin Total: 0.3 mg/dL (ref 0.0–1.2)
CO2: 23 mmol/L (ref 20–29)
CREATININE: 0.94 mg/dL (ref 0.76–1.27)
Calcium: 9.7 mg/dL (ref 8.6–10.2)
Chloride: 102 mmol/L (ref 96–106)
GFR calc Af Amer: 101 mL/min/{1.73_m2} (ref >59)
GFR calc non Af Amer: 87 mL/min/{1.73_m2} (ref >59)
GLUCOSE: 94 mg/dL (ref 65–99)
Globulin, Total: 2.4 g/dL (ref 1.5–4.5)
Potassium: 4.5 mmol/L (ref 3.5–5.2)
Sodium: 141 mmol/L (ref 134–144)
Total Protein: 6.8 g/dL (ref 6.0–8.5)

## 2018-02-09 LAB — TSH: TSH: 2.2 u[IU]/mL (ref 0.450–4.500)

## 2018-02-09 LAB — LIPID PANEL
CHOLESTEROL TOTAL: 171 mg/dL (ref 100–199)
Chol/HDL Ratio: 3.4 ratio (ref 0.0–5.0)
HDL: 50 mg/dL (ref >39)
LDL CALC: 95 mg/dL (ref 0–99)
TRIGLYCERIDES: 130 mg/dL (ref 0–149)
VLDL CHOLESTEROL CAL: 26 mg/dL (ref 5–40)

## 2018-02-09 LAB — CBC
HEMOGLOBIN: 13.5 g/dL (ref 13.0–17.7)
Hematocrit: 41.8 % (ref 37.5–51.0)
MCH: 27 pg (ref 26.6–33.0)
MCHC: 32.3 g/dL (ref 31.5–35.7)
MCV: 84 fL (ref 79–97)
Platelets: 246 10*3/uL (ref 150–379)
RBC: 5 x10E6/uL (ref 4.14–5.80)
RDW: 15.6 % — ABNORMAL HIGH (ref 12.3–15.4)
WBC: 6.1 10*3/uL (ref 3.4–10.8)

## 2018-02-09 LAB — PSA: PROSTATE SPECIFIC AG, SERUM: 0.7 ng/mL (ref 0.0–4.0)

## 2018-02-21 ENCOUNTER — Telehealth: Payer: Self-pay | Admitting: Gastroenterology

## 2018-02-21 NOTE — Telephone Encounter (Signed)
Patient LVM to reschedule his procedure. He is recovering from surgery and was thinking about Sept. Please call [phone]

## 2018-02-21 NOTE — Telephone Encounter (Signed)
Returned patients call.Marland KitchenLVM for pt to return my call if he would like to proceed with scheduling colonoscopy in Sept.  [place]

## 2018-02-24 DIAGNOSIS — H903 Sensorineural hearing loss, bilateral: Secondary | ICD-10-CM | POA: Diagnosis not present

## 2018-02-24 DIAGNOSIS — H9313 Tinnitus, bilateral: Secondary | ICD-10-CM | POA: Diagnosis not present

## 2018-06-01 DIAGNOSIS — R030 Elevated blood-pressure reading, without diagnosis of hypertension: Secondary | ICD-10-CM | POA: Diagnosis not present

## 2018-06-01 DIAGNOSIS — M5416 Radiculopathy, lumbar region: Secondary | ICD-10-CM | POA: Diagnosis not present

## 2018-06-01 DIAGNOSIS — M4150 Other secondary scoliosis, site unspecified: Secondary | ICD-10-CM | POA: Diagnosis not present

## 2018-06-08 ENCOUNTER — Other Ambulatory Visit: Payer: Self-pay | Admitting: Family Medicine

## 2018-08-12 ENCOUNTER — Ambulatory Visit: Payer: 59 | Admitting: Family Medicine

## 2018-09-04 ENCOUNTER — Other Ambulatory Visit: Payer: Self-pay | Admitting: Family Medicine

## 2018-09-04 DIAGNOSIS — I10 Essential (primary) hypertension: Secondary | ICD-10-CM

## 2018-09-23 ENCOUNTER — Other Ambulatory Visit: Payer: Self-pay | Admitting: Physical Medicine & Rehabilitation

## 2018-09-28 ENCOUNTER — Other Ambulatory Visit: Payer: Self-pay | Admitting: Family Medicine

## 2019-01-14 IMAGING — DX DG SCOLIOSIS EVAL COMPLETE SPINE 2-3V
2 series · 8 of 8 positions shown · non-contrast
Comparison: Radiographs 04/13/2017, 02/19/2016

CLINICAL DATA: Scoliosis

EXAM:
DG SCOLIOSIS EVAL COMPLETE SPINE 2-3V

[Series 1: whole body ap · 0.14mm/px · 4 of 4 slices shown]
[im 1/4]
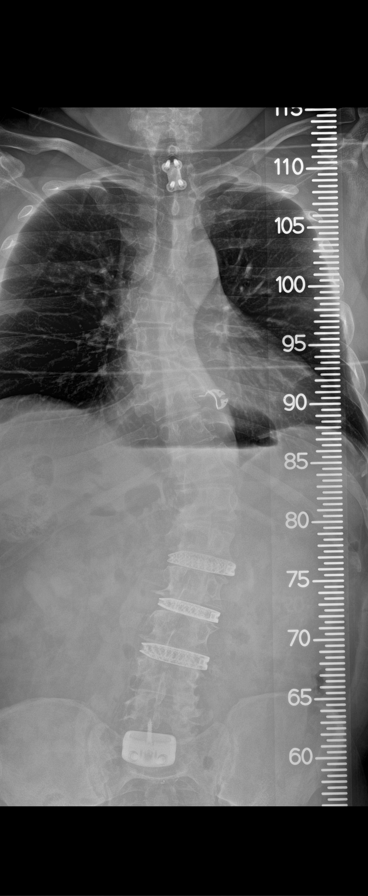
[im 2/4]
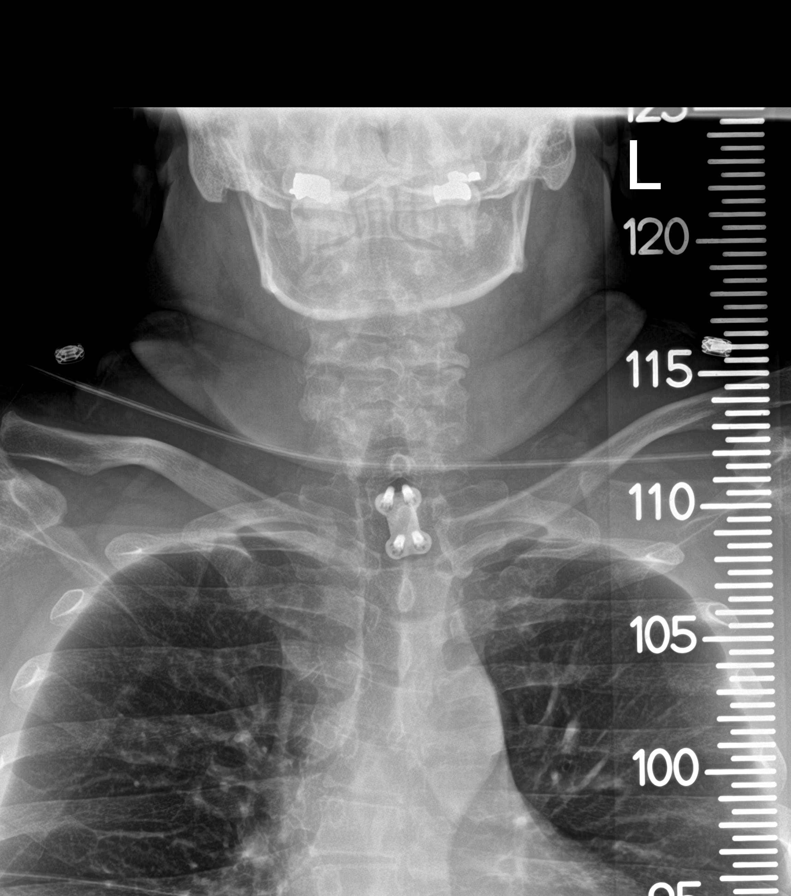
[im 3/4]
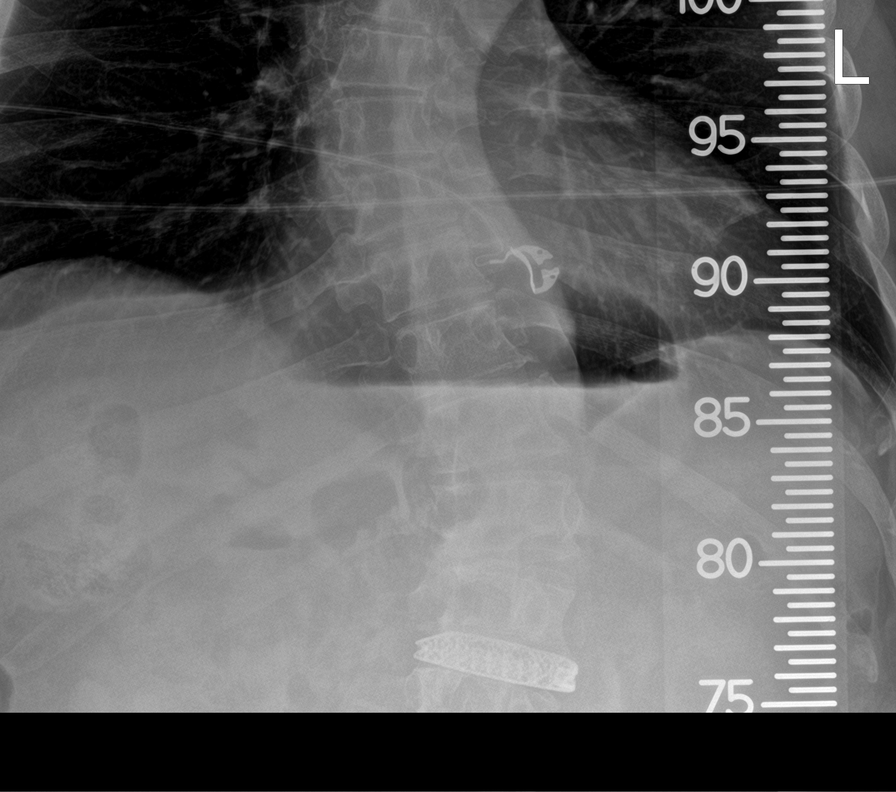
[im 4/4]
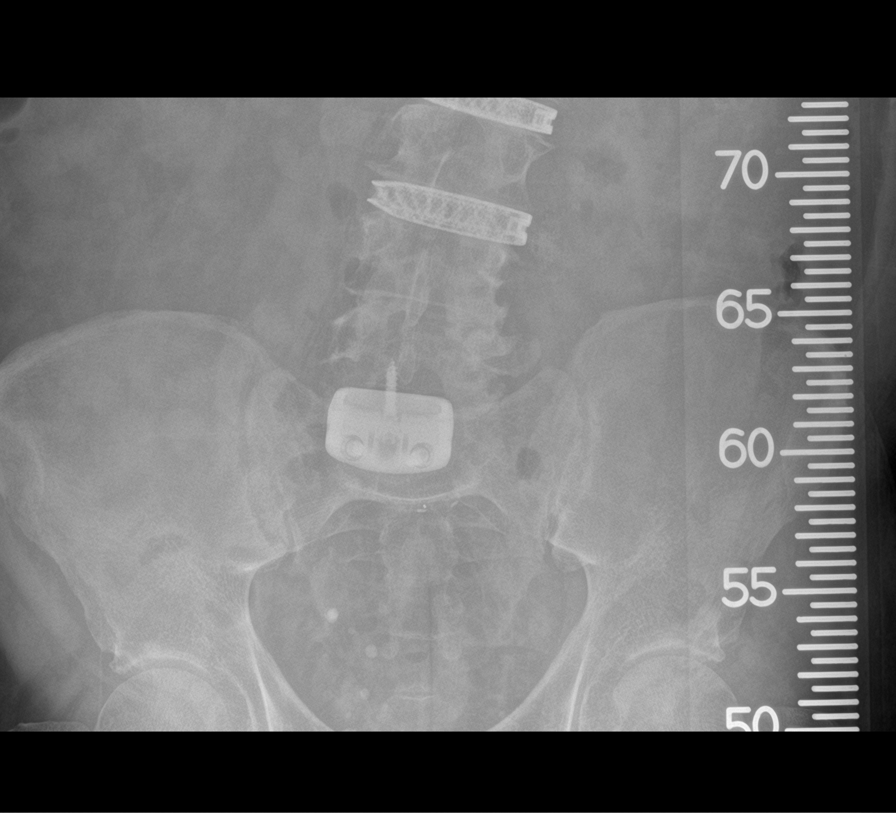

[Series 2: whole body lat · 0.14mm/px · 4 of 4 slices shown]
[im 1/4]
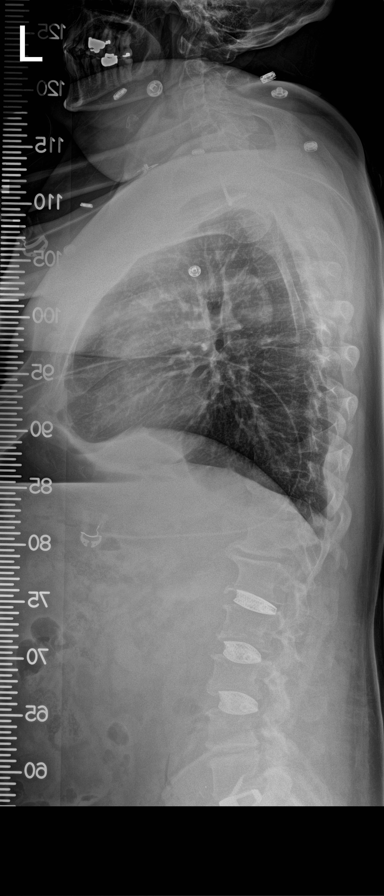
[im 2/4]
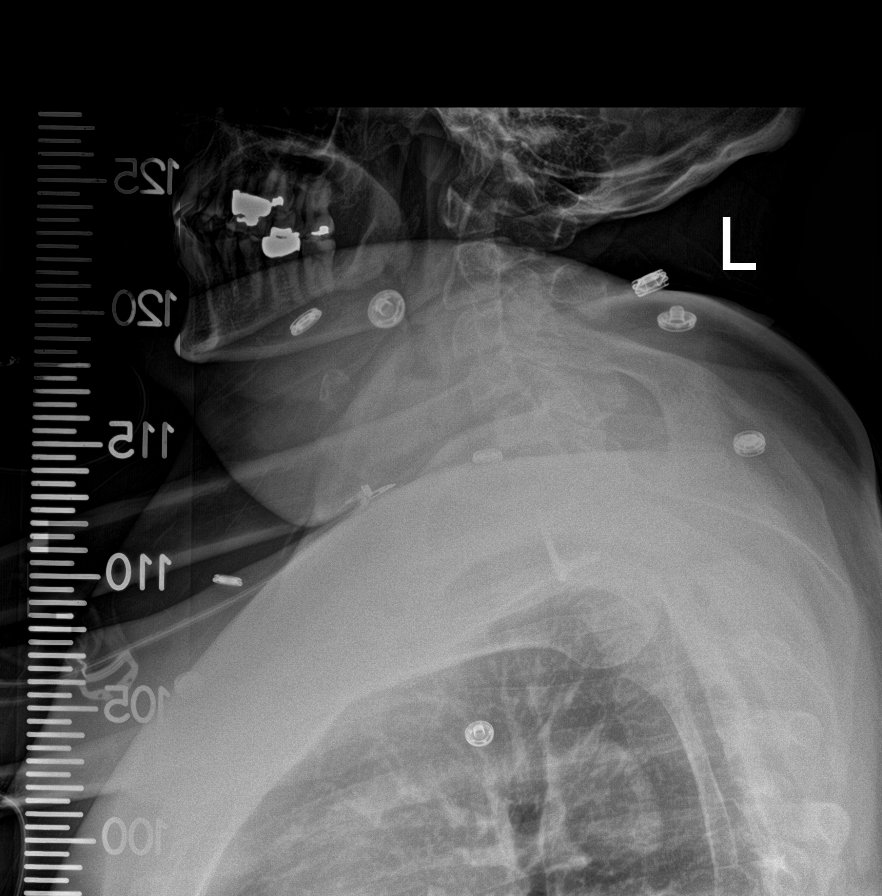
[im 3/4]
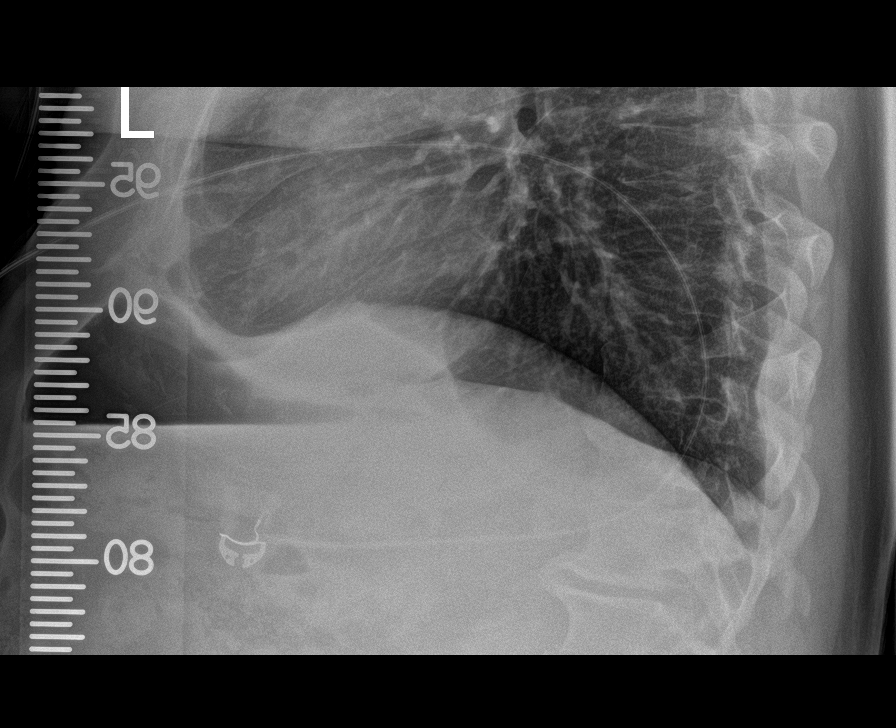
[im 4/4]
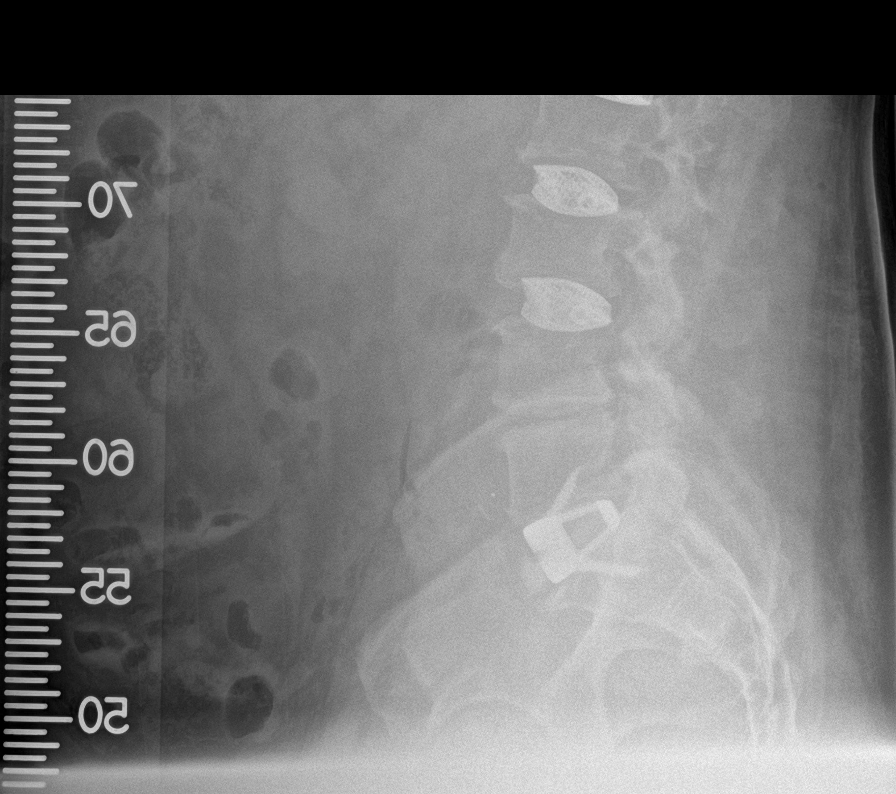

[8 of 8 positions shown; findings below may reference images not displayed]

FINDINGS: Dextroscoliosis of the midthoracic spine, measuring 34 degrees from
superior endplate of T4 to inferior endplate of T10. Levoscoliosis
of the thoracolumbar spine, measuring 37 degrees, measured from
superior endplate of T9 to inferior endplate of L3. Surgical plate
and screw fixation at C7 and T1. Interbody devices at L1-L2, L2-L3
and L3-L4. Anterior fixation at L5-S1 with interbody device also
present. Sagittal view demonstrates cervical lordosis, mild thoracic
kyphosis and slight straightening of the lumbar lordosis.
IMPRESSION: Thoraco lumbar scoliosis as above with multilevel postoperative
changes.

## 2019-01-14 IMAGING — CR DG OR LOCAL ABDOMEN
1 series · 1 of 1 positions shown · non-contrast
Comparison: None.

CLINICAL DATA: L5-S1 anterior fusion, instrument count

EXAM:
OR LOCAL ABDOMEN

[AP]
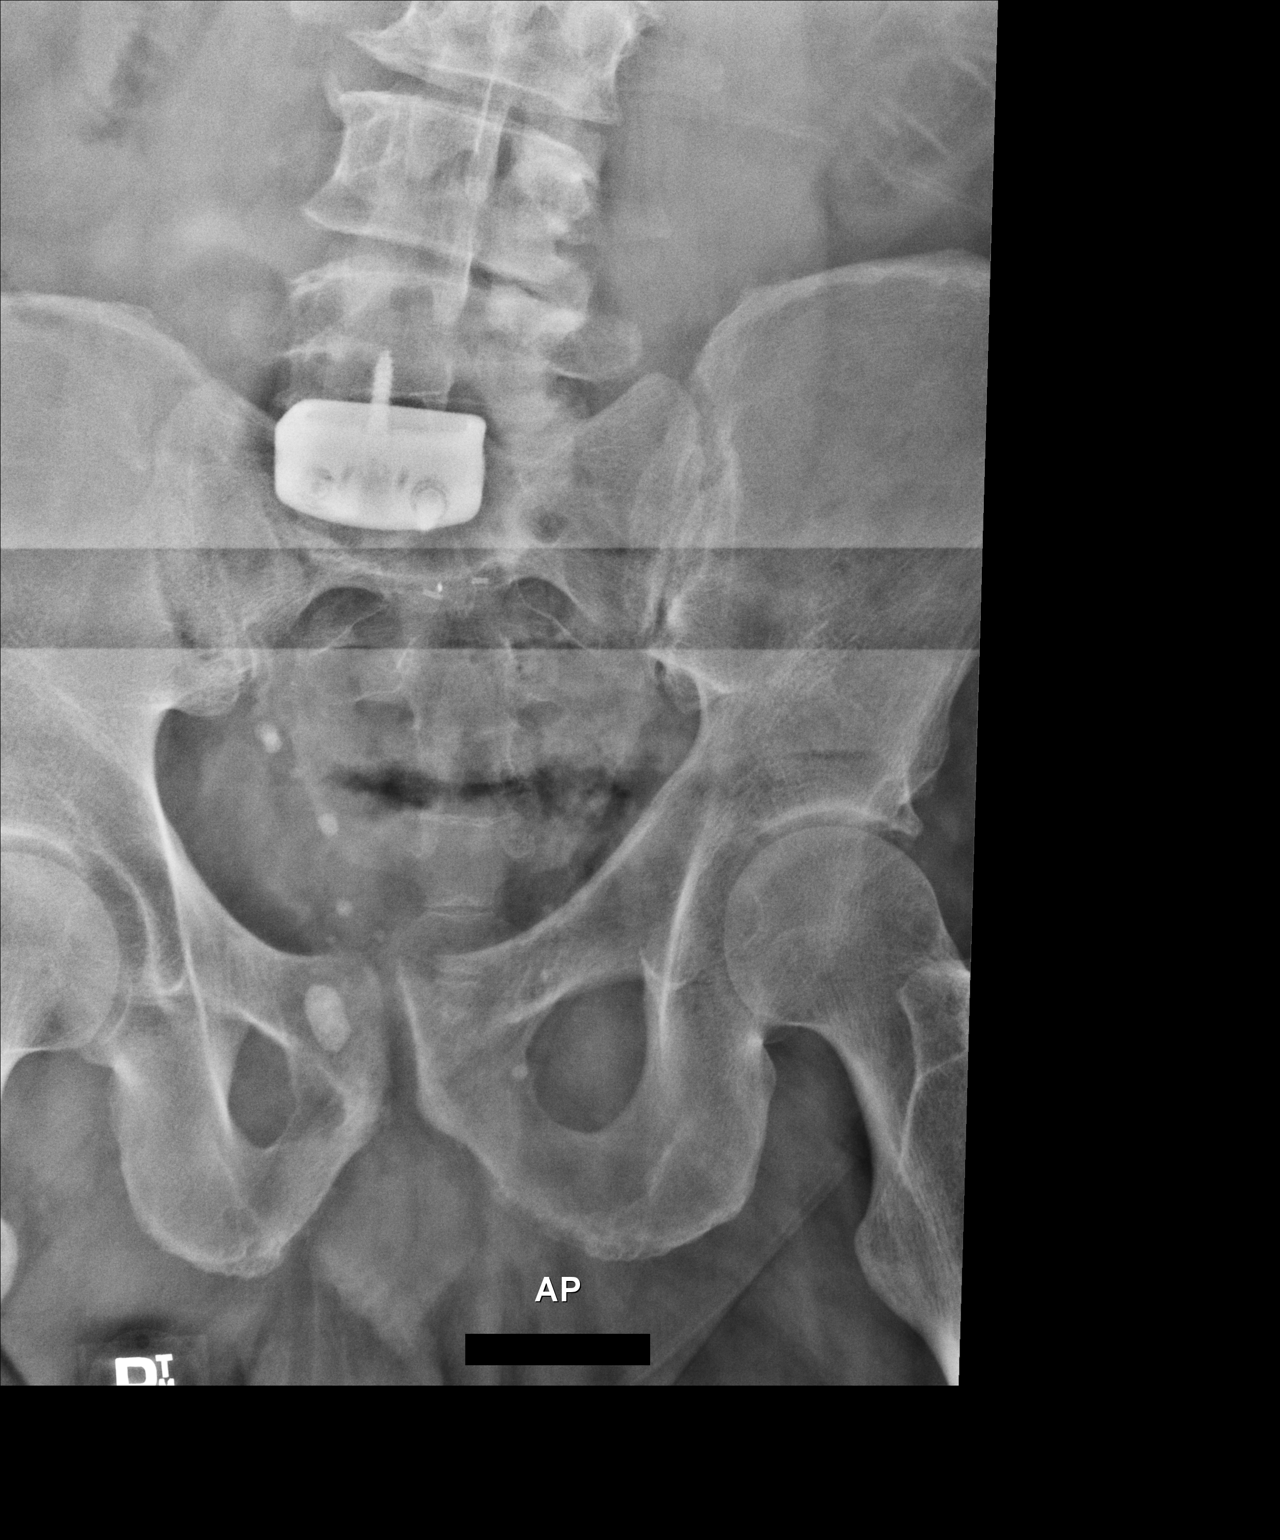

[1 of 1 positions shown; findings below may reference images not displayed]

FINDINGS: Changes of anterior fusion noted at L5-S1. No visible retained
surgical instruments.
IMPRESSION: No visible retained surgical instruments.

These results were called to the operating room at the time of
interpretation.

## 2019-01-14 IMAGING — RF DG C-ARM 61-120 MIN
1 series · 5 of 5 positions shown · non-contrast
Comparison: Lateral views of the lumbar spine February 19, 2016

CLINICAL DATA: L1 through 4 XLIF, L5-S1 ALIF. Reported fluoro time
is 5 minutes, 28 seconds.

EXAM:
LUMBAR SPINE - COMPLETE 4+ VIEW; DG C-ARM 61-120 MIN

[Series 1: run · 5 of 5 slices shown]
[im 1/5]
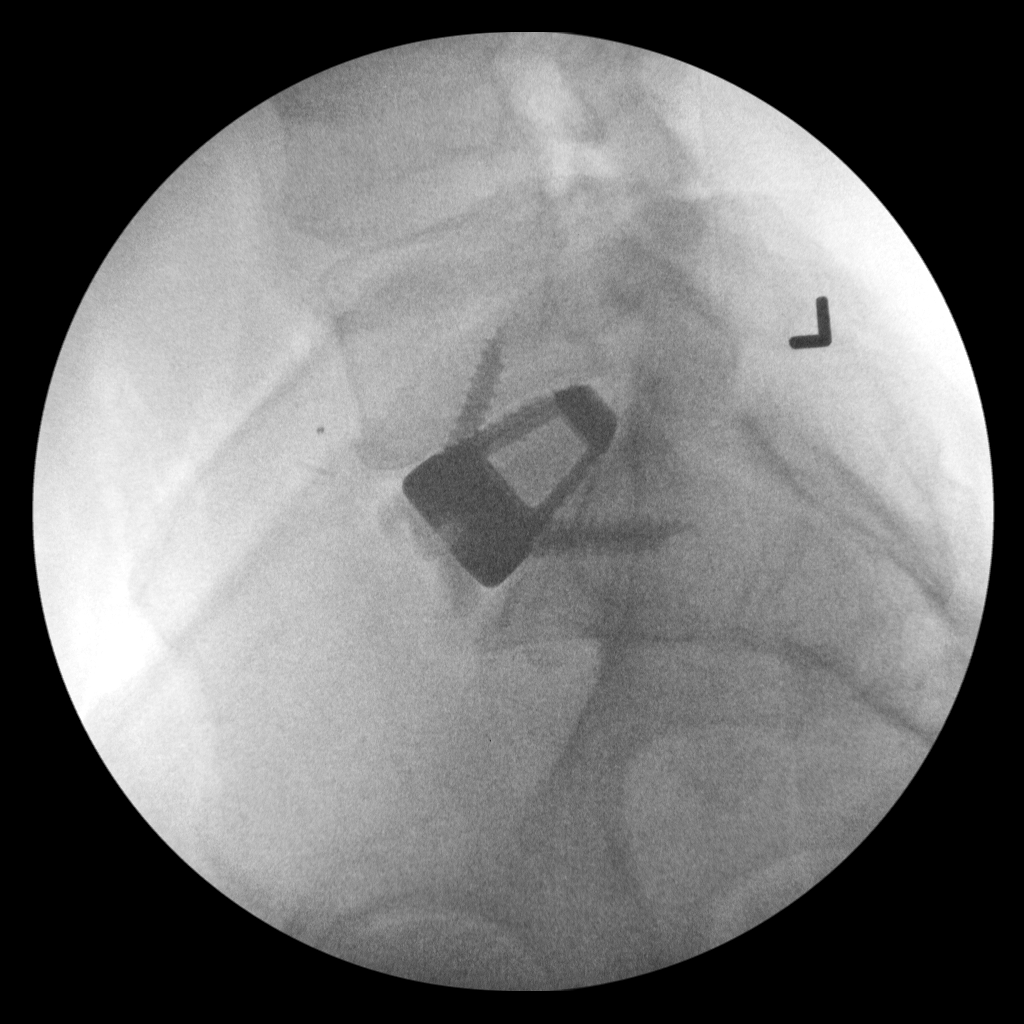
[im 2/5]
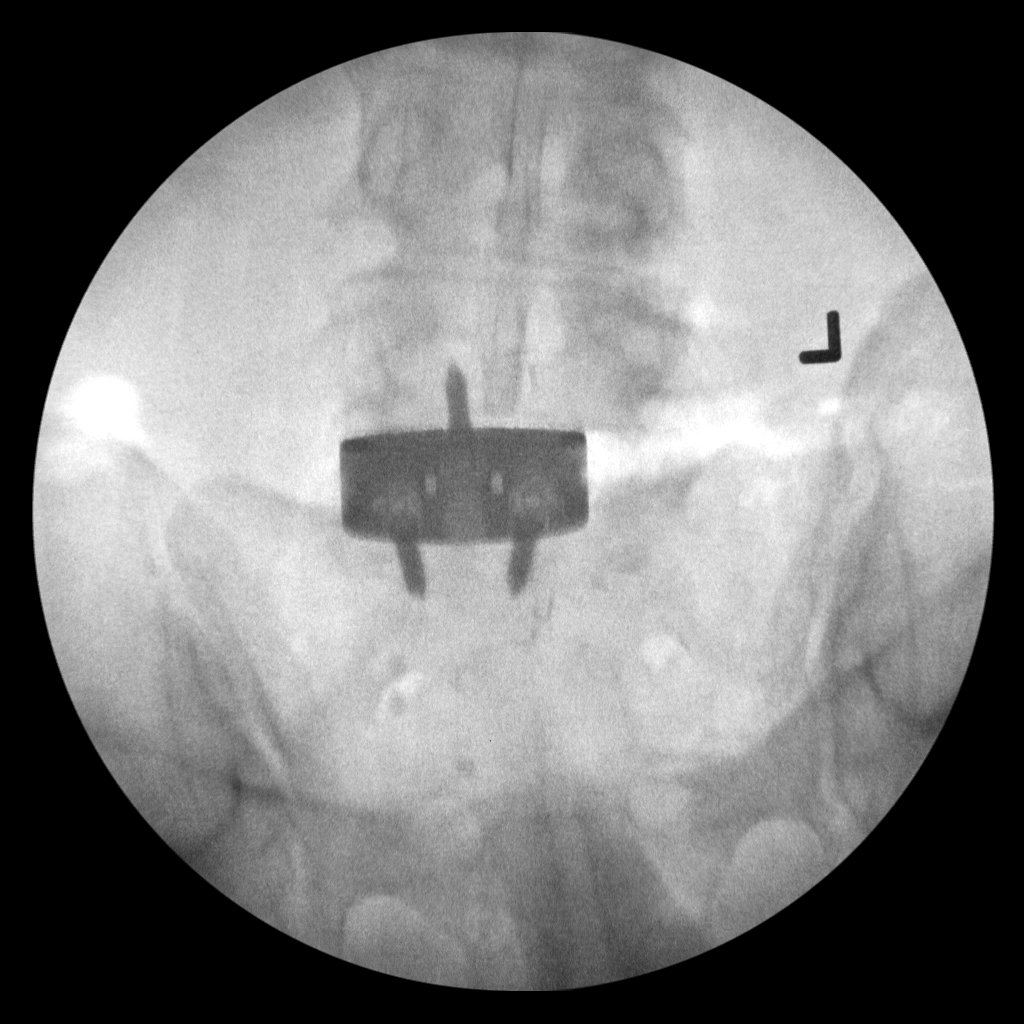
[im 3/5]
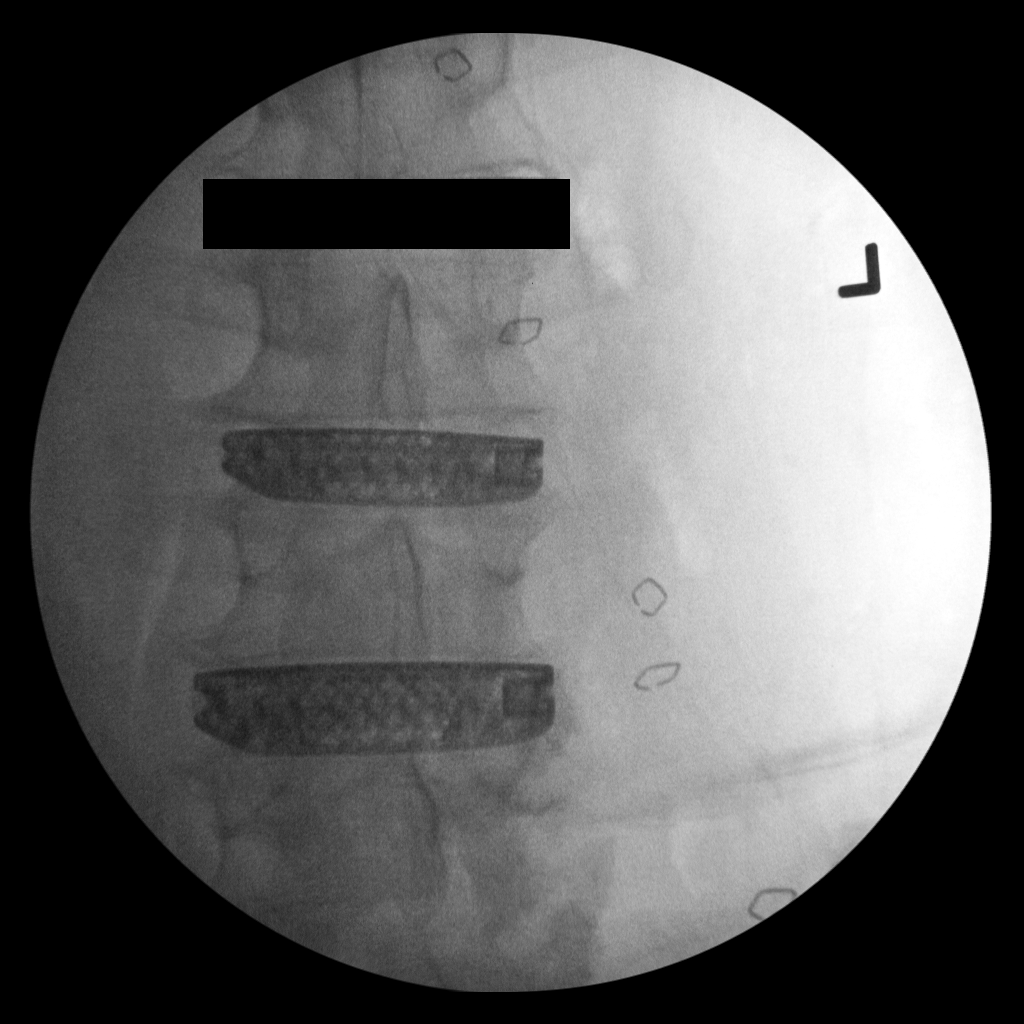
[im 4/5]
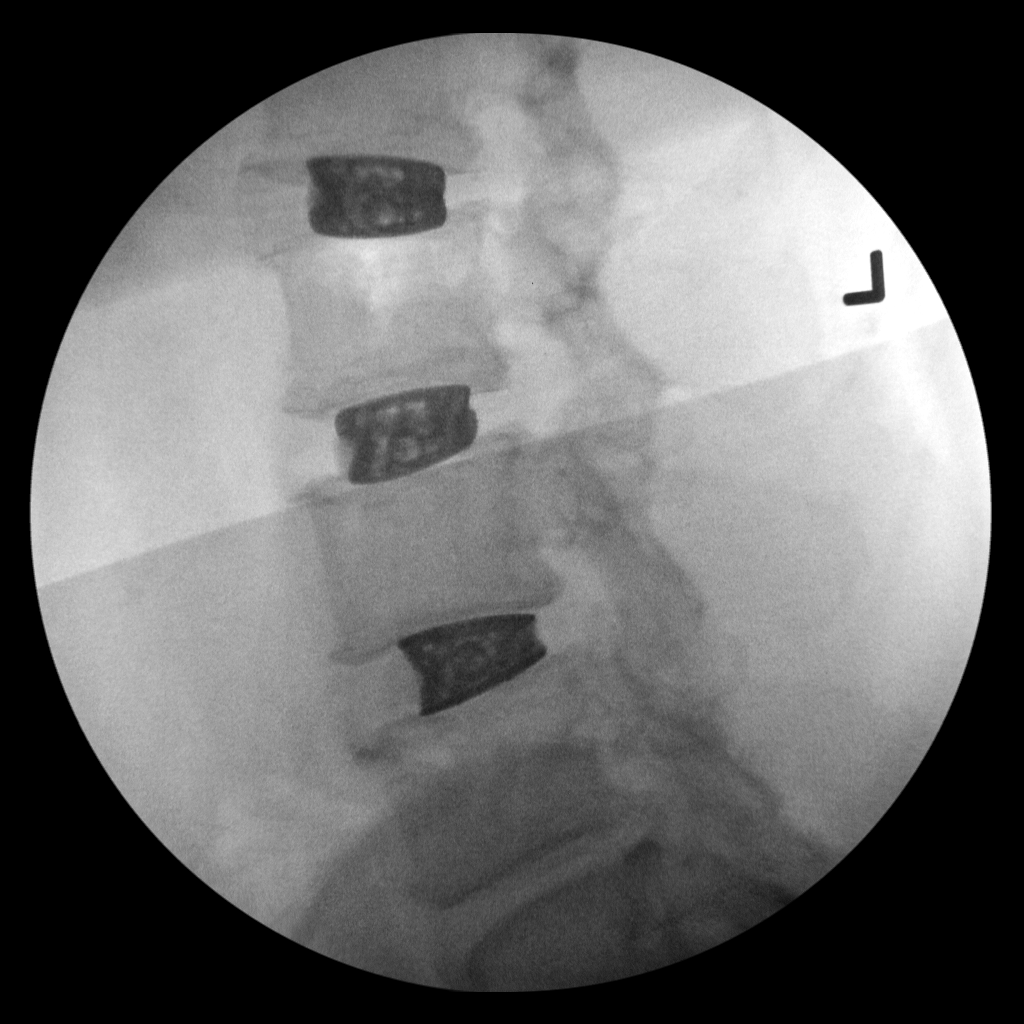
[im 5/5]
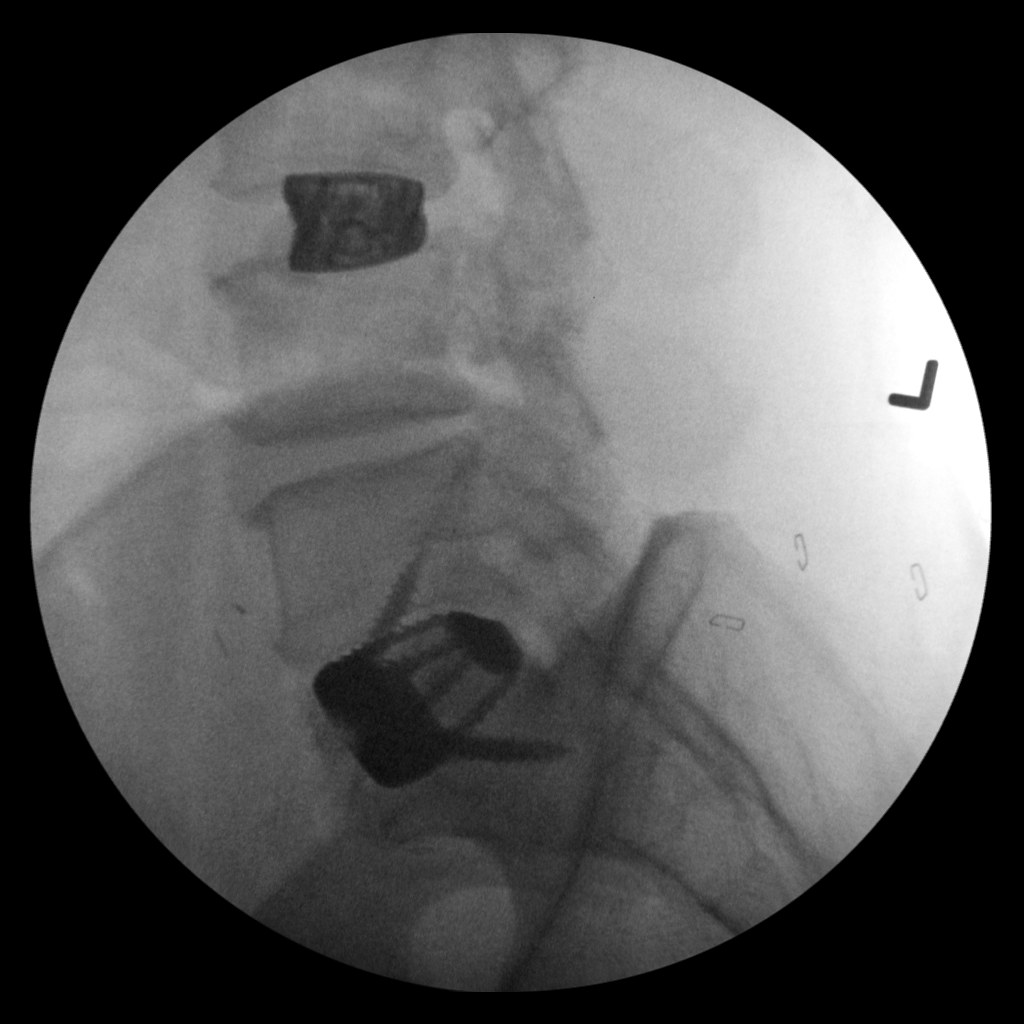

[5 of 5 positions shown; findings below may reference images not displayed]

FINDINGS: Five fluoro spot images are reviewed. The patient is undergone cage
fusion device placement at L1-2, L2-3, and L3-4. The patient has
undergone a fusion device placement at L5-S1. No immediate
postprocedure complications are observed.
IMPRESSION: No immediate postprocedure complication following spinal fusion
procedure.

## 2019-01-15 IMAGING — CR DG LUMBAR SPINE 2-3V
1 series · 1 of 1 positions shown · non-contrast
Comparison: 10/18/2017

CLINICAL DATA: Localization and Assistance with Transforaminal
Lumbar Four-Five Interbody Placement.

EXAM:
LUMBAR SPINE - 2-3 VIEW

[lateral]
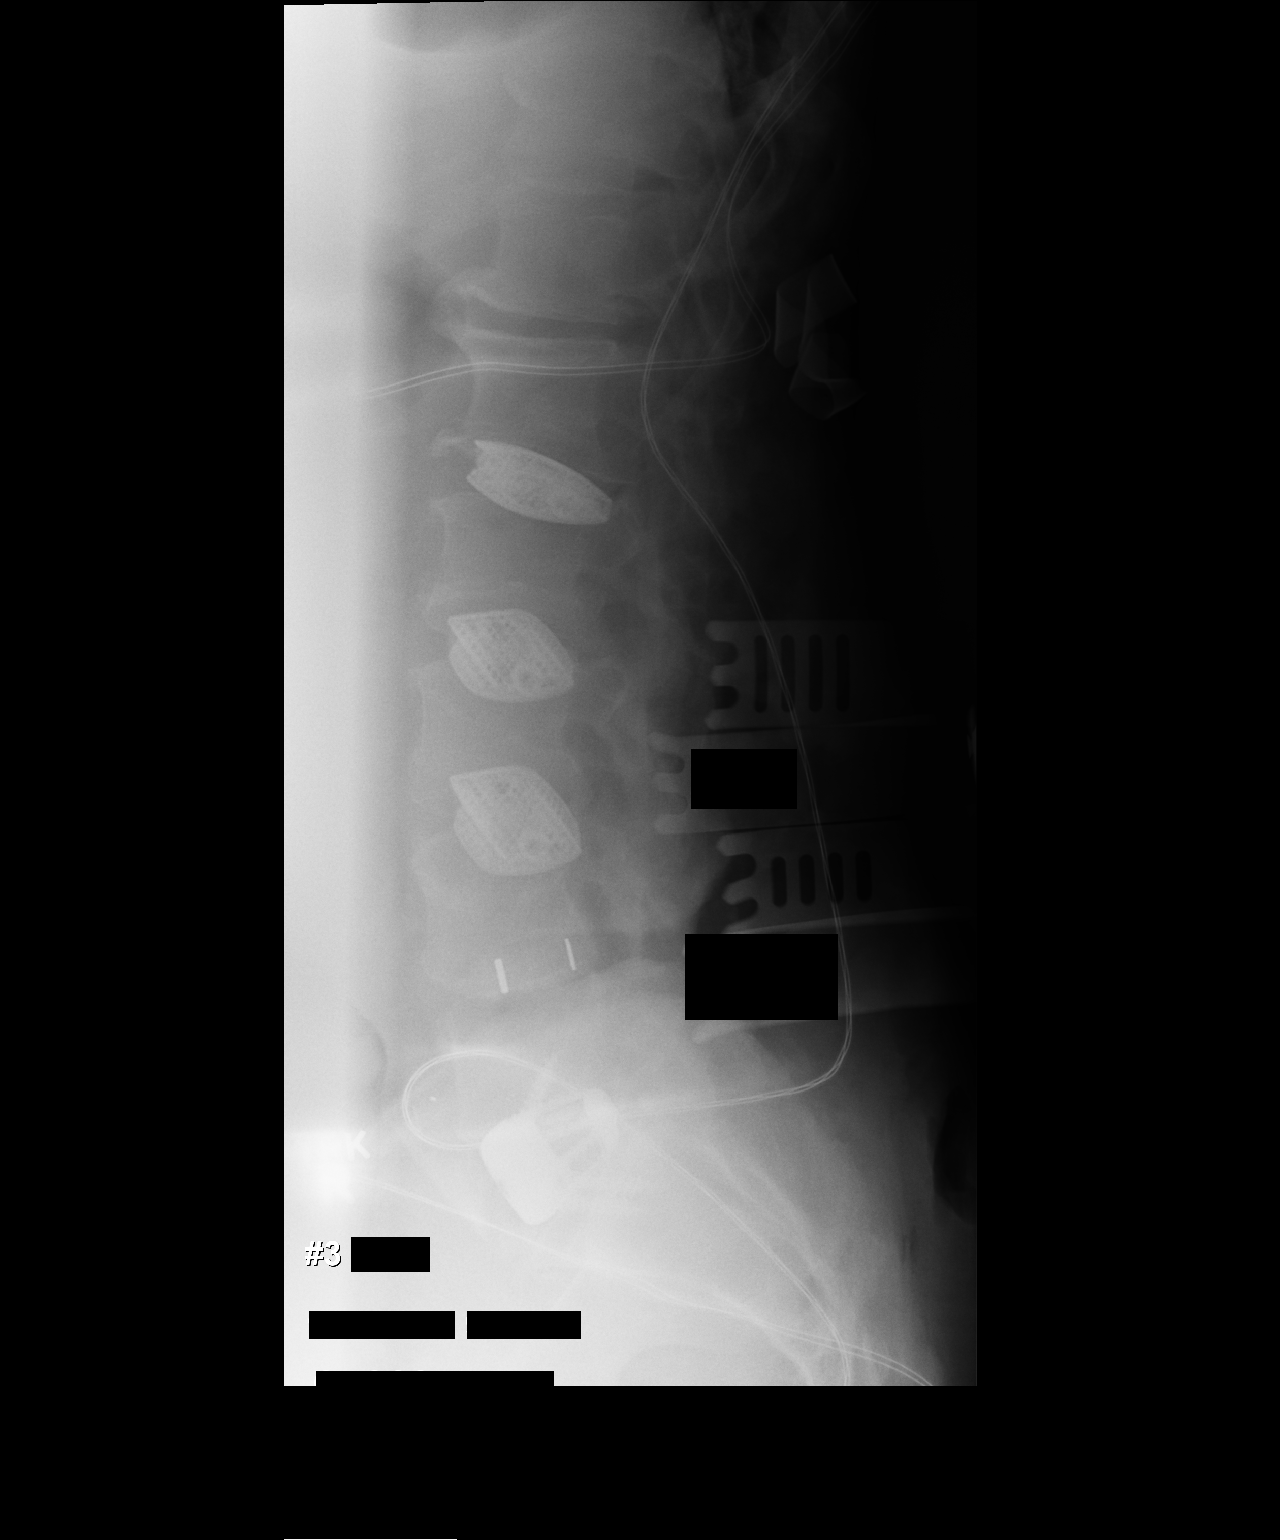

[1 of 1 positions shown; findings below may reference images not displayed]

FINDINGS: Initial image shows placement of probes from a posterior approach,
the more superior superimposed over the L4 spinous process the more
inferior superimposed over the posterior pedicle region of L5.
Followup image shows placement of a surgical instrument into the
L4-L5 disc interspace. Subsequent image shows placement of a
radiolucent disc spacer at the L4-L5 level, in the central to
posterior aspect of the disc interspace.

Changes from the previous interbody fusions at L1-L2, L2-L3, L3-L4
and L5-S1 are stable.
IMPRESSION: Surgical localization imaging for L4-L5 interbody fusion.

## 2019-02-18 ENCOUNTER — Other Ambulatory Visit: Payer: Self-pay | Admitting: Family Medicine

## 2019-02-20 NOTE — Telephone Encounter (Signed)
Will need an OV for more refills

## 2019-05-14 ENCOUNTER — Other Ambulatory Visit: Payer: Self-pay | Admitting: Family Medicine

## 2019-05-18 ENCOUNTER — Other Ambulatory Visit: Payer: Self-pay

## 2019-05-19 ENCOUNTER — Encounter: Payer: Self-pay | Admitting: Family Medicine

## 2019-05-19 ENCOUNTER — Other Ambulatory Visit: Payer: Self-pay | Admitting: Family Medicine

## 2019-05-19 ENCOUNTER — Other Ambulatory Visit: Payer: Self-pay

## 2019-05-19 ENCOUNTER — Ambulatory Visit (INDEPENDENT_AMBULATORY_CARE_PROVIDER_SITE_OTHER): Payer: 59 | Admitting: Family Medicine

## 2019-05-19 VITALS — BP 120/80 | HR 66 | Temp 98.2°F | Ht 65.0 in | Wt 224.6 lb

## 2019-05-19 DIAGNOSIS — Z1329 Encounter for screening for other suspected endocrine disorder: Secondary | ICD-10-CM

## 2019-05-19 DIAGNOSIS — Z0000 Encounter for general adult medical examination without abnormal findings: Secondary | ICD-10-CM | POA: Diagnosis not present

## 2019-05-19 DIAGNOSIS — Z125 Encounter for screening for malignant neoplasm of prostate: Secondary | ICD-10-CM

## 2019-05-19 DIAGNOSIS — I10 Essential (primary) hypertension: Secondary | ICD-10-CM

## 2019-05-19 DIAGNOSIS — Z130 Encounter for screening for diseases of the blood and blood-forming organs and certain disorders involving the immune mechanism: Secondary | ICD-10-CM

## 2019-05-19 DIAGNOSIS — E785 Hyperlipidemia, unspecified: Secondary | ICD-10-CM | POA: Insufficient documentation

## 2019-05-19 DIAGNOSIS — Z1211 Encounter for screening for malignant neoplasm of colon: Secondary | ICD-10-CM

## 2019-05-19 DIAGNOSIS — Z23 Encounter for immunization: Secondary | ICD-10-CM | POA: Diagnosis not present

## 2019-05-19 NOTE — Patient Instructions (Addendum)
Nice to see you.   Please try to start working on some exercise.  Please continue with a healthy diet.  Please try to cut down on soda. We will get you in for your colonoscopy. Please have your lab work done.   Please let us know if you would like to do the shingles vaccine.

## 2019-05-19 NOTE — Progress Notes (Signed)
[name], MD Phone: [phone]  Bobby [name] is a 63 y.o. male who presents today for physical exam.  Exercise: Not getting much exercise.  He is going to start walking. Diet: Healthy lots of fruits and vegetables.  Not many fatty foods.  He does have 116 ounce soda per day. Due for prostate cancer screening.  He is due for colon cancer screening. Hepatitis C and HIV screening up-to-date. Tetanus vaccine, flu vaccine, and shingles vaccine due. No tobacco use or illicit drug use.  Rare alcohol use. Does not see a dentist.  He does see an ophthalmologist. He reports chronic bilateral tinnitus which was evaluated by ENT.  He does take a baby aspirin daily. Patient reports he stopped the hydrochlorothiazide.  He has been taking amlodipine.  Active Ambulatory Problems    Diagnosis Date Noted  . Foot drop, left 11/29/2015  . Routine general medical examination at a health care facility 11/29/2015  . Osteoarthritis of spine with radiculopathy, lumbar region 02/19/2016  . Neck pain 01/08/2017  . Scoliosis of thoracolumbar spine 10/18/2017  . Scoliosis of lumbar region due to degenerative disease of spine in adult 10/23/2017  . Benign essential HTN   . Acute blood loss anemia   . Scrotal edema   . Post-operative pain   . Drug-induced constipation   . Peripheral edema   . Elevated glucose 11/10/2017  . Tinnitus of both ears 02/08/2018  . HLD (hyperlipidemia) 05/19/2019   Resolved Ambulatory Problems    Diagnosis Date Noted  . Hypertension, essential 08/30/2017  . Hypoalbuminemia due to protein-calorie malnutrition [facility])    Past Medical History:  Diagnosis Date  . Elevated liver enzymes   . GERD (gastroesophageal reflux disease)   . Heart murmur   . History of kidney stones   . Hypercholesterolemia   . Hypertension   . Osteoarthritis   . Radiculopathy   . Scoliosis   . Sleep apnea   . Vitamin D deficiency   . Wears glasses     Family History  Problem Relation  Age of Onset  . Hypertension Mother   . Kidney disease Mother   . Hyperlipidemia Mother   . Colon cancer Father   . Pancreatitis Unknown   . Diabetes Other     Social History   Socioeconomic History  . Marital status: Married    Spouse name: Not on file  . Number of children: Not on file  . Years of education: Not on file  . Highest education level: Not on file  Occupational History  . Not on file  Social Needs  . Financial resource strain: Not on file  . Food insecurity    Worry: Not on file    Inability: Not on file  . Transportation needs    Medical: Not on file    Non-medical: Not on file  Tobacco Use  . Smoking status: Former Smoker    Packs/day: 1.00    Years: 15.00    Pack years: 15.00    Types: Cigarettes, Cigars    Quit date: 08/24/2007    Years since quitting: 11.7  . Smokeless tobacco: Never Used  . Tobacco comment: " quit smoking around 2007"  Substance and Sexual Activity  . Alcohol use: Yes    Alcohol/week: 1.0 standard drinks    Types: 1 Standard drinks or equivalent per week    Comment: occasional  . Drug use: No  . Sexual activity: Not on file  Lifestyle  . Physical activity  Days per week: Not on file    Minutes per session: Not on file  . Stress: Not on file  Relationships  . Social Herbalist on phone: Not on file    Gets together: Not on file    Attends religious service: Not on file    Active member of club or organization: Not on file    Attends meetings of clubs or organizations: Not on file    Relationship status: Not on file  . Intimate partner violence    Fear of current or ex partner: Not on file    Emotionally abused: Not on file    Physically abused: Not on file    Forced sexual activity: Not on file  Other Topics Concern  . Not on file  Social History Narrative  . Not on file    ROS  General:  Negative for nexplained weight loss, fever Skin: Negative for new or changing mole, sore that won't heal HEENT:  Positive for tinnitus, negative for trouble hearing, trouble seeing, mouth sores, hoarseness, change in voice, dysphagia. CV:  Negative for chest pain, dyspnea, edema, palpitations Resp: Negative for cough, dyspnea, hemoptysis GI: Negative for nausea, vomiting, diarrhea, constipation, abdominal pain, melena, hematochezia. GU: Negative for dysuria, incontinence, urinary hesitance, hematuria, vaginal or penile discharge, polyuria, sexual difficulty, lumps in testicle or breasts MSK: Negative for muscle cramps or aches, joint pain or swelling Neuro: Negative for headaches, weakness, numbness, dizziness, passing out/fainting Psych: Negative for depression, anxiety, memory problems  Objective  Physical Exam Vitals:   05/19/19 1410  BP: 120/80  Pulse: 66  Temp: 98.2 F (36.8 C)  SpO2: 98%    BP Readings from Last 3 Encounters:  05/19/19 120/80  02/08/18 130/70  12/02/17 116/83   Wt Readings from Last 3 Encounters:  05/19/19 224 lb 9.6 oz (101.9 kg)  02/08/18 211 lb 6.4 oz (95.9 kg)  11/10/17 206 lb 9.6 oz (93.7 kg)    Physical Exam Constitutional:      General: He is not in acute distress.    Appearance: He is not diaphoretic.  HENT:     Head: Normocephalic and atraumatic.  Eyes:     Conjunctiva/sclera: Conjunctivae normal.     Pupils: Pupils are equal, round, and reactive to light.  Cardiovascular:     Rate and Rhythm: Normal rate and regular rhythm.     Heart sounds: Normal heart sounds.  Pulmonary:     Effort: Pulmonary effort is normal.     Breath sounds: Normal breath sounds.  Abdominal:     General: Bowel sounds are normal. There is no distension.     Palpations: Abdomen is soft.     Tenderness: There is no abdominal tenderness. There is no guarding or rebound.  Musculoskeletal:     Right lower leg: No edema.     Left lower leg: No edema.  Lymphadenopathy:     Cervical: No cervical adenopathy.  Skin:    General: Skin is warm and dry.  Neurological:      Mental Status: He is alert.  Psychiatric:        Mood and Affect: Mood normal.      Assessment/Plan:   Routine general medical examination at a health care facility Physical exam completed.  Encouraged continued healthy diet.  He will add in walking for exercise.  Referral to GI for colonoscopy.  PSA for prostate cancer screening.  Td given today.  We will check with his insurance regarding Shingrix.  Flu shot to be given later in the fall once they become available.  He will hold his aspirin for 1 to 2 weeks to see if that helps with the tinnitus.  Lab work as outlined below.   Orders Placed This Encounter  Procedures  . PSA  . Comp Met (CMET)  . Lipid panel  . HgB A1c  . CBC  . TSH  . Ambulatory referral to Gastroenterology    Referral Priority:   Routine    Referral Type:   Consultation    Referral Reason:   Specialty Services Required    Number of Visits Requested:   1    No orders of the defined types were placed in this encounter.    [name], MD [place]

## 2019-05-19 NOTE — Assessment & Plan Note (Signed)
Physical exam completed.  Encouraged continued healthy diet.  He will add in walking for exercise.  Referral to GI for colonoscopy.  PSA for prostate cancer screening.  Td given today.  We will check with his insurance regarding Shingrix.  Flu shot to be given later in the fall once they become available.  He will hold his aspirin for 1 to 2 weeks to see if that helps with the tinnitus.  Lab work as outlined below.

## 2019-05-22 ENCOUNTER — Other Ambulatory Visit: Payer: Self-pay

## 2019-05-22 ENCOUNTER — Telehealth: Payer: Self-pay

## 2019-05-22 DIAGNOSIS — Z1211 Encounter for screening for malignant neoplasm of colon: Secondary | ICD-10-CM

## 2019-05-22 DIAGNOSIS — Z86010 Personal history of colonic polyps: Secondary | ICD-10-CM

## 2019-05-22 MED ORDER — NA SULFATE-K SULFATE-MG SULF 17.5-3.13-1.6 GM/177ML PO SOLN: 1.0000 | Freq: Once | ORAL | 0 refills | Status: AC

## 2019-05-22 NOTE — Addendum Note (Signed)
Addended by: Fulton Mole D on: 05/22/2019 11:12 AM   Modules accepted: Orders

## 2019-05-22 NOTE — Telephone Encounter (Signed)
Gastroenterology Pre-Procedure Review  Request Date: 06/05/19 Requesting Physician: Dr. Allen Norris  PATIENT REVIEW QUESTIONS: The patient responded to the following health history questions as indicated:    1. Are you having any GI issues? no 2. Do you have a personal history of Polyps? yes (2015 Dr. Allen Norris) 3. Do you have a family history of Colon Cancer or Polyps? yes (father colon cancer) 4. Diabetes Mellitus? no 5. Joint replacements in the past 12 months?no 6. Major health problems in the past 3 months?no 7. Any artificial heart valves, MVP, or defibrillator?no    MEDICATIONS & ALLERGIES:    Patient reports the following regarding taking any anticoagulation/antiplatelet therapy:   Plavix, Coumadin, Eliquis, Xarelto, Lovenox, Pradaxa, Brilinta, or Effient? no Aspirin? yes (81 mg daily)  Patient confirms/reports the following medications:  Current Outpatient Medications  Medication Sig Dispense Refill  . amLODipine (NORVASC) 2.5 MG tablet TAKE 1 TABLET(2.5 MG) BY MOUTH DAILY 90 tablet 0  . aspirin 81 MG tablet Take 81 mg by mouth daily.     Marland Kitchen atorvastatin (LIPITOR) 20 MG tablet TAKE 1 TABLET BY MOUTH  DAILY AT 6 PM. 90 tablet 0  . hydrochlorothiazide (HYDRODIURIL) 12.5 MG tablet Take 1 tablet (12.5 mg total) by mouth daily. 90 tablet 3  . Na Sulfate-K Sulfate-Mg Sulf 17.5-3.13-1.6 GM/177ML SOLN Take 1 kit by mouth once for 1 dose. 354 mL 0   No current facility-administered medications for this visit.     Patient confirms/reports the following allergies:  No Known Allergies  No orders of the defined types were placed in this encounter.   AUTHORIZATION INFORMATION Primary Insurance: 1D#: Group #:  Secondary Insurance: 1D#: Group #:  SCHEDULE INFORMATION: Date: 06/05/19 Time: Location:[facility]

## 2019-05-26 LAB — LIPID PANEL
Chol/HDL Ratio: 4 ratio (ref 0.0–5.0)
Cholesterol, Total: 179 mg/dL (ref 100–199)
HDL: 45 mg/dL (ref >39)
LDL Calculated: 103 mg/dL — ABNORMAL HIGH (ref 0–99)
Triglycerides: 157 mg/dL — ABNORMAL HIGH (ref 0–149)
VLDL Cholesterol Cal: 31 mg/dL (ref 5–40)

## 2019-05-26 LAB — COMPREHENSIVE METABOLIC PANEL
ALT: 45 IU/L — ABNORMAL HIGH (ref 0–44)
AST: 29 IU/L (ref 0–40)
Albumin/Globulin Ratio: 1.8 (ref 1.2–2.2)
Albumin: 4.4 g/dL (ref 3.8–4.8)
Alkaline Phosphatase: 96 IU/L (ref 39–117)
BUN/Creatinine Ratio: 15 (ref 10–24)
BUN: 18 mg/dL (ref 8–27)
Bilirubin Total: 0.4 mg/dL (ref 0.0–1.2)
CO2: 25 mmol/L (ref 20–29)
Calcium: 9.8 mg/dL (ref 8.6–10.2)
Chloride: 99 mmol/L (ref 96–106)
Creatinine, Ser: 1.19 mg/dL (ref 0.76–1.27)
GFR calc Af Amer: 75 mL/min/{1.73_m2} (ref >59)
GFR calc non Af Amer: 65 mL/min/{1.73_m2} (ref >59)
Globulin, Total: 2.4 g/dL (ref 1.5–4.5)
Glucose: 89 mg/dL (ref 65–99)
Potassium: 4.6 mmol/L (ref 3.5–5.2)
Sodium: 139 mmol/L (ref 134–144)
Total Protein: 6.8 g/dL (ref 6.0–8.5)

## 2019-05-26 LAB — CBC
Hematocrit: 43.9 % (ref 37.5–51.0)
Hemoglobin: 14.9 g/dL (ref 13.0–17.7)
MCH: 29.4 pg (ref 26.6–33.0)
MCHC: 33.9 g/dL (ref 31.5–35.7)
MCV: 87 fL (ref 79–97)
Platelets: 218 10*3/uL (ref 150–450)
RBC: 5.06 x10E6/uL (ref 4.14–5.80)
RDW: 13.1 % (ref 11.6–15.4)
WBC: 8 10*3/uL (ref 3.4–10.8)

## 2019-05-26 LAB — TSH: TSH: 1.94 u[IU]/mL (ref 0.450–4.500)

## 2019-05-26 LAB — HEMOGLOBIN A1C
Est. average glucose Bld gHb Est-mCnc: 123 mg/dL
Hgb A1c MFr Bld: 5.9 % — ABNORMAL HIGH (ref 4.8–5.6)

## 2019-05-26 LAB — PSA: Prostate Specific Ag, Serum: 0.9 ng/mL (ref 0.0–4.0)

## 2019-05-30 ENCOUNTER — Other Ambulatory Visit: Payer: Self-pay | Admitting: Family Medicine

## 2019-05-31 ENCOUNTER — Encounter: Payer: Self-pay | Admitting: *Deleted

## 2019-05-31 ENCOUNTER — Other Ambulatory Visit: Payer: Self-pay

## 2019-06-01 ENCOUNTER — Other Ambulatory Visit
Admission: RE | Admit: 2019-06-01 | Discharge: 2019-06-01 | Disposition: A | Discharge status: Home / Self Care | Payer: 59 | Source: Ambulatory Visit | Attending: Gastroenterology | Admitting: Gastroenterology

## 2019-06-01 DIAGNOSIS — Z01812 Encounter for preprocedural laboratory examination: Secondary | ICD-10-CM | POA: Diagnosis present

## 2019-06-01 DIAGNOSIS — Z20828 Contact with and (suspected) exposure to other viral communicable diseases: Secondary | ICD-10-CM | POA: Diagnosis not present

## 2019-06-01 LAB — SARS CORONAVIRUS 2 (TAT 6-24 HRS): SARS Coronavirus 2: NEGATIVE

## 2019-06-02 NOTE — Anesthesia Preprocedure Evaluation (Addendum)
Anesthesia Evaluation  Patient identified by MRN, date of birth, ID band Patient awake    Reviewed: Allergy & Precautions, NPO status , Patient's Chart, lab work & pertinent test results  Airway Mallampati: II  TM Distance: >3 FB Neck ROM: Limited    Dental   Pulmonary sleep apnea , former smoker,    breath sounds clear to auscultation       Cardiovascular hypertension,  Rhythm:Regular Rate:Normal   HLD   Neuro/Psych  Neuromuscular disease (Radiculopathy, L foot drop)    GI/Hepatic GERD  Controlled,  Endo/Other    Renal/GU  Kidney stones     Musculoskeletal  (+) Arthritis ,  Scoliosis   Abdominal (+) + obese (BMI 37),   Peds  Hematology  (+) anemia ,   Anesthesia Other Findings   Reproductive/Obstetrics                           Anesthesia Physical Anesthesia Plan  ASA: III  Anesthesia Plan: General   Post-op Pain Management:    Induction: Intravenous  PONV Risk Score and Plan: TIVA  Airway Management Planned: Natural Airway  Additional Equipment:   Intra-op Plan:   Post-operative Plan:   Informed Consent: I have reviewed the patients History and Physical, chart, labs and discussed the procedure including the risks, benefits and alternatives for the proposed anesthesia with the patient or authorized representative who has indicated his/her understanding and acceptance.       Plan Discussed with: CRNA and Anesthesiologist  Anesthesia Plan Comments:        Anesthesia Quick Evaluation   Active Ambulatory Problems    Diagnosis Date Noted  . Foot drop, left 11/29/2015  . Routine general medical examination at a health care facility 11/29/2015  . Osteoarthritis of spine with radiculopathy, lumbar region 02/19/2016  . Neck pain 01/08/2017  . Scoliosis of thoracolumbar spine 10/18/2017  . Scoliosis of lumbar region due to degenerative disease of spine in adult  10/23/2017  . Benign essential HTN   . Acute blood loss anemia   . Scrotal edema   . Post-operative pain   . Drug-induced constipation   . Peripheral edema   . Elevated glucose 11/10/2017  . Tinnitus of both ears 02/08/2018  . HLD (hyperlipidemia) 05/19/2019   Resolved Ambulatory Problems    Diagnosis Date Noted  . Hypertension, essential 08/30/2017  . Hypoalbuminemia due to protein-calorie malnutrition [facility])    Past Medical History:  Diagnosis Date  . Elevated liver enzymes   . GERD (gastroesophageal reflux disease)   . Heart murmur   . History of kidney stones   . Hypercholesterolemia   . Hypertension   . Osteoarthritis   . Radiculopathy   . Scoliosis   . Sleep apnea   . Vitamin D deficiency   . Wears glasses     CBC    Component Value Date/Time   WBC 8.0 05/25/2019 1107   WBC 8.4 10/28/2017 0448   RBC 5.06 05/25/2019 1107   RBC 2.77 (L) 10/28/2017 0448   HGB 14.9 05/25/2019 1107   HCT 43.9 05/25/2019 1107   PLT 218 05/25/2019 1107   MCV 87 05/25/2019 1107   MCV 92 10/12/2011 1803   MCH 29.4 05/25/2019 1107   MCH 29.6 10/28/2017 0448   MCHC 33.9 05/25/2019 1107   MCHC 32.4 10/28/2017 0448   RDW 13.1 05/25/2019 1107   RDW 13.6 10/12/2011 1803   LYMPHSABS 1.2 10/24/2017 0542   MONOABS 1.7 (  H) 10/24/2017 0542   EOSABS 0.3 10/24/2017 0542   BASOSABS 0.0 10/24/2017 0542    CMP     Component Value Date/Time   NA 139 05/25/2019 1107   NA 141 10/12/2011 1803   K 4.6 05/25/2019 1107   K 4.7 10/12/2011 1803   CL 99 05/25/2019 1107   CL 103 10/12/2011 1803   CO2 25 05/25/2019 1107   CO2 29 10/12/2011 1803   GLUCOSE 89 05/25/2019 1107   GLUCOSE 126 (H) 10/28/2017 0448   GLUCOSE 110 (H) 10/12/2011 1803   BUN 18 05/25/2019 1107   BUN 22 (H) 10/12/2011 1803   CREATININE 1.19 05/25/2019 1107   CREATININE 1.33 (H) 10/12/2011 1803   CALCIUM 9.8 05/25/2019 1107   CALCIUM 9.4 10/12/2011 1803   PROT 6.8 05/25/2019 1107   PROT 7.5 10/12/2011 1803   ALBUMIN  4.4 05/25/2019 1107   ALBUMIN 4.4 10/12/2011 1803   AST 29 05/25/2019 1107   AST 30 10/12/2011 1803   ALT 45 (H) 05/25/2019 1107   ALT 60 10/12/2011 1803   ALKPHOS 96 05/25/2019 1107   ALKPHOS 38 (L) 10/12/2011 1803   BILITOT 0.4 05/25/2019 1107   BILITOT 0.4 10/12/2011 1803   GFRNONAA 65 05/25/2019 1107   GFRNONAA 59 (L) 10/12/2011 1803   GFRAA 75 05/25/2019 1107   GFRAA >60 10/12/2011 1803    COAGS No results found for: INR, PTT  I have seen and consented the patient, Bobby [name]. I have answered all of his questions regarding anesthesia. he is appropriately NPO.   Josephina Shih, MD Anesthesia

## 2019-06-02 NOTE — Discharge Instructions (Signed)

## 2019-06-05 ENCOUNTER — Encounter
Admission: RE | Disposition: A | Discharge status: Home / Self Care | Payer: Self-pay | Source: Home / Self Care | Attending: Gastroenterology

## 2019-06-05 ENCOUNTER — Other Ambulatory Visit: Payer: Self-pay

## 2019-06-05 ENCOUNTER — Ambulatory Visit: Payer: 59 | Admitting: Anesthesiology

## 2019-06-05 ENCOUNTER — Ambulatory Visit
Admission: RE | Admit: 2019-06-05 | Discharge: 2019-06-05 | Disposition: A | Discharge status: Home / Self Care | Payer: 59 | Attending: Gastroenterology | Admitting: Gastroenterology

## 2019-06-05 DIAGNOSIS — Z7982 Long term (current) use of aspirin: Secondary | ICD-10-CM | POA: Diagnosis not present

## 2019-06-05 DIAGNOSIS — Z981 Arthrodesis status: Secondary | ICD-10-CM | POA: Diagnosis not present

## 2019-06-05 DIAGNOSIS — E7800 Pure hypercholesterolemia, unspecified: Secondary | ICD-10-CM | POA: Insufficient documentation

## 2019-06-05 DIAGNOSIS — Z860100 Personal history of colon polyps, unspecified: Secondary | ICD-10-CM

## 2019-06-05 DIAGNOSIS — G4730 Sleep apnea, unspecified: Secondary | ICD-10-CM | POA: Insufficient documentation

## 2019-06-05 DIAGNOSIS — Z87891 Personal history of nicotine dependence: Secondary | ICD-10-CM | POA: Insufficient documentation

## 2019-06-05 DIAGNOSIS — M1990 Unspecified osteoarthritis, unspecified site: Secondary | ICD-10-CM | POA: Insufficient documentation

## 2019-06-05 DIAGNOSIS — Z1211 Encounter for screening for malignant neoplasm of colon: Secondary | ICD-10-CM | POA: Diagnosis not present

## 2019-06-05 DIAGNOSIS — K641 Second degree hemorrhoids: Secondary | ICD-10-CM | POA: Insufficient documentation

## 2019-06-05 DIAGNOSIS — Z79899 Other long term (current) drug therapy: Secondary | ICD-10-CM | POA: Insufficient documentation

## 2019-06-05 HISTORY — PX: COLONOSCOPY WITH PROPOFOL: SHX5780

## 2019-06-05 SURGERY — COLONOSCOPY WITH PROPOFOL
Anesthesia: General | Site: Rectum

## 2019-06-05 MED ORDER — ONDANSETRON HCL 4 MG/2ML IJ SOLN: 4.0000 mg | Freq: Once | INTRAMUSCULAR | Status: DC | PRN

## 2019-06-05 MED ORDER — LIDOCAINE HCL (CARDIAC) PF 100 MG/5ML IV SOSY
PREFILLED_SYRINGE | INTRAVENOUS | Status: DC | PRN
  Administered 2019-06-05: 50 mg via INTRAVENOUS

## 2019-06-05 MED ORDER — STERILE WATER FOR IRRIGATION IR SOLN
Status: DC | PRN
  Administered 2019-06-05: 09:00:00 50 mL

## 2019-06-05 MED ORDER — PROPOFOL 10 MG/ML IV BOLUS
INTRAVENOUS | Status: DC | PRN
  Administered 2019-06-05: 200 mg via INTRAVENOUS
  Administered 2019-06-05: 100 mg via INTRAVENOUS

## 2019-06-05 MED ORDER — LACTATED RINGERS IV SOLN
INTRAVENOUS | Status: DC
  Administered 2019-06-05: 08:00:00 via INTRAVENOUS

## 2019-06-05 MED ORDER — ACETAMINOPHEN 10 MG/ML IV SOLN: 1000.0000 mg | Freq: Once | INTRAVENOUS | Status: DC | PRN

## 2019-06-05 SURGICAL SUPPLY — 24 items

## 2019-06-05 NOTE — Anesthesia Postprocedure Evaluation (Signed)
Anesthesia Post Note  Patient: Bobby [name]  Procedure(s) Performed: COLONOSCOPY WITH PROPOFOL (N/A Rectum)  Patient location during evaluation: PACU Anesthesia Type: General Level of consciousness: awake and alert Pain management: pain level controlled Vital Signs Assessment: post-procedure vital signs reviewed and stable Respiratory status: spontaneous breathing, nonlabored ventilation, respiratory function stable and patient connected to nasal cannula oxygen Cardiovascular status: blood pressure returned to baseline and stable Postop Assessment: no apparent nausea or vomiting Anesthetic complications: no    Juwana Thoreson A  Lily Velasquez

## 2019-06-05 NOTE — Anesthesia Procedure Notes (Signed)
Procedure Name: General with mask airway Performed by: Izetta Dakin, CRNA Pre-anesthesia Checklist: Timeout performed, Patient being monitored, Patient identified and Emergency Drugs available Patient Re-evaluated:Patient Re-evaluated prior to induction Oxygen Delivery Method: Nasal cannula

## 2019-06-05 NOTE — Op Note (Signed)
[facility] Gastroenterology Patient Name: Bobby [name] Procedure Date: 06/05/2019 8:43 AM MRN: [id number] Account #: [id number] Date of Birth: [birth year] Admit Type: Outpatient Age: 63 Room: [facility] OR ROOM 01 Gender: Male Note Status: Finalized Procedure:            Colonoscopy Indications:          High risk colon cancer surveillance: Personal history                        of colonic polyps Providers:            [name] MD, MD Referring MD:         Angela Adam. Caryl Bis (Referring MD) Medicines:            Propofol per Anesthesia Complications:        No immediate complications. Procedure:            Pre-Anesthesia Assessment:                       - Prior to the procedure, a History and Physical was                        performed, and patient medications and allergies were                        reviewed. The patient's tolerance of previous                        anesthesia was also reviewed. The risks and benefits of                        the procedure and the sedation options and risks were                        discussed with the patient. All questions were                        answered, and informed consent was obtained. Prior                        Anticoagulants: The patient has taken no previous                        anticoagulant or antiplatelet agents. ASA Grade                        Assessment: II - A patient with mild systemic disease.                        After reviewing the risks and benefits, the patient was                        deemed in satisfactory condition to undergo the                        procedure.                       After obtaining informed consent, the colonoscope was  passed under direct vision. Throughout the procedure,                        the patient's blood pressure, pulse, and oxygen                        saturations were monitored continuously. The was                        introduced  through the anus and advanced to the the                        cecum, identified by appendiceal orifice and ileocecal                        valve. The colonoscopy was performed without                        difficulty. The patient tolerated the procedure well.                        The quality of the bowel preparation was excellent. Findings:      The perianal and digital rectal examinations were normal.      Non-bleeding internal hemorrhoids were found during retroflexion. The       hemorrhoids were Grade II (internal hemorrhoids that prolapse but reduce       spontaneously). Impression:           - Non-bleeding internal hemorrhoids.                       - No specimens collected. Recommendation:       - Discharge patient to home.                       - Resume previous diet.                       - Continue present medications.                       - Repeat colonoscopy in 5 years for surveillance. Procedure Code(s):    --- Professional ---                       [phone], Colonoscopy, flexible; diagnostic, including                        collection of specimen(s) by brushing or washing, when                        performed (separate procedure) Diagnosis Code(s):    --- Professional ---                       Z86.010, Personal history of colonic polyps CPT copyright 2019 [employer]. All rights reserved. The codes documented in this report are preliminary and upon coder review may  be revised to meet current compliance requirements. [name] MD, MD 06/05/2019 9:04:32 AM This report has been signed electronically. Number of Addenda: 0 Note Initiated On: 06/05/2019 8:43 AM Scope Withdrawal Time: 0 hours 7 minutes 57 seconds  Total Procedure Duration: 0 hours  11 minutes 45 seconds  Estimated Blood Loss: Estimated blood loss: none.      [facility]

## 2019-06-05 NOTE — H&P (Signed)
Bobby Lame, MD [address] [place], [place] [address] Phone:[phone] Fax : [phone]  Primary Care Physician:  Leone Haven, MD Primary Gastroenterologist:  Dr. Allen Norris  Pre-Procedure History & Physical: HPI:  Bobby [name] is a 63 y.o. male is here for an colonoscopy.   Past Medical History:  Diagnosis Date  . Elevated liver enzymes   . GERD (gastroesophageal reflux disease)   . Heart murmur    as a child only; outgrew it  . History of kidney stones    last attack was in 2016  . Hypercholesterolemia   . Hypertension   . Osteoarthritis    spine  . Radiculopathy   . Scoliosis   . Sleep apnea    does not wear CPAP since weight loss  . Vitamin D deficiency   . Wears glasses     Past Surgical History:  Procedure Laterality Date  . ABDOMINAL EXPOSURE N/A 10/18/2017   Procedure: ABDOMINAL EXPOSURE;  Surgeon: Rosetta Posner, MD;  Location: [facility];  Service: Vascular;  Laterality: N/A;  Part-1  . ANTERIOR FUSION CERVICAL SPINE  2002   . ANTERIOR LATERAL LUMBAR FUSION 4 LEVELS Left 10/18/2017   Procedure: Left Lumbar One-Two,Lumbar Two-Three,Lumbar Three-Four Anterolateral lumbar interbody fusion;  Surgeon: Erline Levine, MD;  Location: [facility];  Service: Neurosurgery;  Laterality: Left;  anterolateral approach Part-2  . ANTERIOR LUMBAR FUSION N/A 10/18/2017   Procedure: Lumbar Five-Sacral One Anterior lumbar interbody fusion with Dr. Curt Jews;  Surgeon: Erline Levine, MD;  Location: [facility];  Service: Neurosurgery;  Laterality: N/A;  Part-1 Abdominal approach  . APPLICATION OF INTRAOPERATIVE CT SCAN N/A 10/19/2017   Procedure: APPLICATION OF INTRAOPERATIVE CT SCAN;  Surgeon: Erline Levine, MD;  Location: [facility];  Service: Neurosurgery;  Laterality: N/A;  APPLICATION OF INTRAOPERATIVE CT SCAN  . BACK SURGERY    . COLONOSCOPY W/ BIOPSIES AND POLYPECTOMY    . LUMBAR LAMINECTOMY/DECOMPRESSION MICRODISCECTOMY Left 02/19/2016   Procedure: Laminectomy and  Foraminotomy -Left Lumbar three-four, Lumbar four-five, Lumbar five-Sacral one ;  Surgeon: Ashok Pall, MD;  Location: [facility];  Service: Neurosurgery;  Laterality: Left;  . POSTERIOR LUMBAR FUSION 4 LEVEL N/A 10/19/2017   Procedure: Thoracic Eight to Pelvis fixation and pedicle screws  Transforaminal Lumbar Interbody Fusion Lumbar Four-Five with possible osteotomy;  Surgeon: Erline Levine, MD;  Location: [facility];  Service: Neurosurgery;  Laterality: N/A;  Thoracic Eight to Pelvis fixation and pedicle screws  Transforaminal Lumbar Interbody Fusion Lumbar Four-Five with possible osteotomy     Prior to Admission medications   Medication Sig Start Date End Date Taking? Authorizing Provider  amLODipine (NORVASC) 2.5 MG tablet TAKE 1 TABLET(2.5 MG) BY MOUTH DAILY 05/22/19  Yes Leone Haven, MD  Ascorbic Acid (VITAMIN C PO) Take by mouth daily.   Yes [provider]  aspirin 81 MG tablet Take 81 mg by mouth daily.  06/28/06  Yes [provider]  atorvastatin (LIPITOR) 20 MG tablet TAKE 1 TABLET BY MOUTH  DAILY AT 6 PM. 05/31/19  Yes Leone Haven, MD  B Complex Vitamins (VITAMIN B COMPLEX PO) Take by mouth daily.   Yes [provider]  VITAMIN E PO Take by mouth daily.   Yes [provider]  hydrochlorothiazide (HYDRODIURIL) 12.5 MG tablet Take 1 tablet (12.5 mg total) by mouth daily. Patient not taking: Reported on 05/31/2019 12/15/17   Leone Haven, MD    Allergies as of 05/22/2019  . (No Known Allergies)  Family History  Problem Relation Age of Onset  . Hypertension Mother   . Kidney disease Mother   . Hyperlipidemia Mother   . Colon cancer Father   . Pancreatitis Other   . Diabetes Other     Social History   Socioeconomic History  . Marital status: Married    Spouse name: Not on file  . Number of children: Not on file  . Years of education: Not on file  . Highest education level: Not on file  Occupational History  . Not on  file  Social Needs  . Financial resource strain: Not on file  . Food insecurity    Worry: Not on file    Inability: Not on file  . Transportation needs    Medical: Not on file    Non-medical: Not on file  Tobacco Use  . Smoking status: Former Smoker    Packs/day: 1.00    Years: 15.00    Pack years: 15.00    Types: Cigarettes, Cigars    Quit date: 08/24/2007    Years since quitting: 11.7  . Smokeless tobacco: Never Used  . Tobacco comment: " quit smoking around 2007"  Substance and Sexual Activity  . Alcohol use: Yes    Alcohol/week: 1.0 standard drinks    Types: 1 Standard drinks or equivalent per week    Comment: occasional (1-2x/mo)  . Drug use: No  . Sexual activity: Not on file  Lifestyle  . Physical activity    Days per week: Not on file    Minutes per session: Not on file  . Stress: Not on file  Relationships  . Social Herbalist on phone: Not on file    Gets together: Not on file    Attends religious service: Not on file    Active member of club or organization: Not on file    Attends meetings of clubs or organizations: Not on file    Relationship status: Not on file  . Intimate partner violence    Fear of current or ex partner: Not on file    Emotionally abused: Not on file    Physically abused: Not on file    Forced sexual activity: Not on file  Other Topics Concern  . Not on file  Social History Narrative  . Not on file    Review of Systems: See HPI, otherwise negative ROS  Physical Exam: Ht 5\' 5"  (1.651 m)   Wt 102.1 kg   BMI 37.44 kg/m  General:   Alert,  pleasant and cooperative in NAD Head:  Normocephalic and atraumatic. Neck:  Supple; no masses or thyromegaly. Lungs:  Clear throughout to auscultation.    Heart:  Regular rate and rhythm. Abdomen:  Soft, nontender and nondistended. Normal bowel sounds, without guarding, and without rebound.   Neurologic:  Alert and  oriented x4;  grossly normal neurologically.  Impression/Plan:  Bobby [name] is here for an colonoscopy to be performed for history of adenomatous polyps 05/21/2014  Risks, benefits, limitations, and alternatives regarding  endoscopy and colonoscopy have been reviewed with the patient.  Questions have been answered.  All parties agreeable.   Bobby Lame, MD  06/05/2019, 7:35 AM

## 2019-06-05 NOTE — Transfer of Care (Signed)
Immediate Anesthesia Transfer of Care Note  Patient: Bobby [name]  Procedure(s) Performed: COLONOSCOPY WITH PROPOFOL (N/A Rectum)  Patient Location: PACU  Anesthesia Type: General  Level of Consciousness: awake, alert  and patient cooperative  Airway and Oxygen Therapy: Patient Spontanous Breathing and Patient connected to supplemental oxygen  Post-op Assessment: Post-op Vital signs reviewed, Patient's Cardiovascular Status Stable, Respiratory Function Stable, Patent Airway and No signs of Nausea or vomiting  Post-op Vital Signs: Reviewed and stable  Complications: No apparent anesthesia complications

## 2019-06-08 ENCOUNTER — Encounter: Payer: Self-pay | Admitting: *Deleted

## 2019-06-08 ENCOUNTER — Other Ambulatory Visit: Payer: Self-pay | Admitting: Family Medicine

## 2019-06-08 DIAGNOSIS — R945 Abnormal results of liver function studies: Secondary | ICD-10-CM

## 2019-06-08 DIAGNOSIS — R7989 Other specified abnormal findings of blood chemistry: Secondary | ICD-10-CM

## 2019-06-08 MED ORDER — ATORVASTATIN CALCIUM 40 MG PO TABS: 40.0000 mg | ORAL_TABLET | Freq: Every day | ORAL | 1 refills | Status: DC

## 2019-08-21 ENCOUNTER — Other Ambulatory Visit: Payer: Self-pay

## 2019-08-21 MED ORDER — AMLODIPINE BESYLATE 2.5 MG PO TABS: ORAL_TABLET | ORAL | 0 refills | Status: DC

## 2019-11-20 ENCOUNTER — Ambulatory Visit (INDEPENDENT_AMBULATORY_CARE_PROVIDER_SITE_OTHER): Payer: 59 | Admitting: Family Medicine

## 2019-11-20 ENCOUNTER — Other Ambulatory Visit: Payer: Self-pay

## 2019-11-20 ENCOUNTER — Encounter: Payer: Self-pay | Admitting: Family Medicine

## 2019-11-20 VITALS — Ht 65.0 in | Wt 235.0 lb

## 2019-11-20 DIAGNOSIS — R7303 Prediabetes: Secondary | ICD-10-CM | POA: Diagnosis not present

## 2019-11-20 DIAGNOSIS — I10 Essential (primary) hypertension: Secondary | ICD-10-CM | POA: Diagnosis not present

## 2019-11-20 DIAGNOSIS — E785 Hyperlipidemia, unspecified: Secondary | ICD-10-CM

## 2019-11-20 DIAGNOSIS — R062 Wheezing: Secondary | ICD-10-CM | POA: Insufficient documentation

## 2019-11-20 DIAGNOSIS — G629 Polyneuropathy, unspecified: Secondary | ICD-10-CM

## 2019-11-20 MED ORDER — GABAPENTIN 100 MG PO CAPS: 100.0000 mg | ORAL_CAPSULE | Freq: Three times a day (TID) | ORAL | 3 refills | Status: DC

## 2019-11-20 MED ORDER — AMLODIPINE BESYLATE 2.5 MG PO TABS: ORAL_TABLET | ORAL | 2 refills | Status: DC

## 2019-11-20 NOTE — Assessment & Plan Note (Signed)
Chronic issue.  Potentially related to prior back surgery or prior nerve impingement damage.  Discussed trial of gabapentin to see if that would be beneficial.  Advised to monitor for drowsiness and not drive if he is drowsy.  He will contact us in 2 weeks to let us know if his symptoms have improved with the starting dose.

## 2019-11-20 NOTE — Assessment & Plan Note (Signed)
Adequately controlled.  Continue current regimen.  Check lab work. 

## 2019-11-20 NOTE — Assessment & Plan Note (Signed)
-  Continue Lipitor °

## 2019-11-20 NOTE — Progress Notes (Signed)
Virtual Visit via video Note  This visit type was conducted due to national recommendations for restrictions regarding the COVID-19 pandemic (e.g. social distancing).  This format is felt to be most appropriate for this patient at this time.  All issues noted in this document were discussed and addressed.  No physical exam was performed (except for noted visual exam findings with Video Visits).   I connected with Bobby [name] today at  2:15 PM EST by a video enabled telemedicine application and verified that I am speaking with the correct person using two identifiers. Location patient: home Location provider: work Persons participating in the virtual visit: patient, provider  I discussed the limitations, risks, security and privacy concerns of performing an evaluation and management service by telephone and the availability of in person appointments. I also discussed with the patient that there may be a patient responsible charge related to this service. The patient expressed understanding and agreed to proceed.  Reason for visit: f/u  HPI: Hypertension: Recently 123/71.  Taking amlodipine.  No chest pain, shortness of breath, or edema.  Hyperlipidemia: Taking Lipitor.  No rebound pain or myalgias.  Left foot and leg tingling: Patient notes these areas tingle all the time.  He notes his foot is very sensitive at times.  This has been going on for quite some time.  He discussed it with his neurosurgeon previously.  He wonders if it was related to his prior surgery and notes that the surgeon advised him there was nothing they could do for it.  He did take somebody else's gabapentin for a period of time and noted that did provide some benefit.  He has tingling during the day and night.  Wheezing: Patient notes that he feels as though he has a wheezing noise from his throat for the last 2 to 3 weeks.  There may be a little congestion in his throat.  No cough, drainage, nasal discharge, reflux,  or swallowing difficulty.  No fevers.   ROS: See pertinent positives and negatives per HPI.  Past Medical History:  Diagnosis Date  . Elevated liver enzymes   . GERD (gastroesophageal reflux disease)   . Heart murmur    as a child only; outgrew it  . History of kidney stones    last attack was in 2016  . Hypercholesterolemia   . Hypertension   . Osteoarthritis    spine  . Radiculopathy   . Scoliosis   . Sleep apnea    does not wear CPAP since weight loss  . Vitamin D deficiency   . Wears glasses     Past Surgical History:  Procedure Laterality Date  . ABDOMINAL EXPOSURE N/A 10/18/2017   Procedure: ABDOMINAL EXPOSURE;  Surgeon: Rosetta Posner, MD;  Location: ;  Service: Vascular;  Laterality: N/A;  Part-1  . ANTERIOR FUSION CERVICAL SPINE  2002   . ANTERIOR LATERAL LUMBAR FUSION 4 LEVELS Left 10/18/2017   Procedure: Left Lumbar One-Two,Lumbar Two-Three,Lumbar Three-Four Anterolateral lumbar interbody fusion;  Surgeon: Erline Levine, MD;  Location: [facility];  Service: Neurosurgery;  Laterality: Left;  anterolateral approach Part-2  . ANTERIOR LUMBAR FUSION N/A 10/18/2017   Procedure: Lumbar Five-Sacral One Anterior lumbar interbody fusion with Dr. Curt Jews;  Surgeon: Erline Levine, MD;  Location: [facility];  Service: Neurosurgery;  Laterality: N/A;  Part-1 Abdominal approach  . APPLICATION OF INTRAOPERATIVE CT SCAN N/A 10/19/2017   Procedure: APPLICATION OF INTRAOPERATIVE CT SCAN;  Surgeon: Erline Levine, MD;  Location: [facility];  Service: Neurosurgery;  Laterality: N/A;  APPLICATION OF INTRAOPERATIVE CT SCAN  . BACK SURGERY    . COLONOSCOPY W/ BIOPSIES AND POLYPECTOMY    . COLONOSCOPY WITH PROPOFOL N/A 06/05/2019   Procedure: COLONOSCOPY WITH PROPOFOL;  Surgeon: [name], MD;  Location: [facility];  Service: Endoscopy;  Laterality: N/A;  . LUMBAR LAMINECTOMY/DECOMPRESSION MICRODISCECTOMY Left 02/19/2016   Procedure: Laminectomy and Foraminotomy -Left Lumbar three-four,  Lumbar four-five, Lumbar five-Sacral one ;  Surgeon: Ashok Pall, MD;  Location: [facility];  Service: Neurosurgery;  Laterality: Left;  . POSTERIOR LUMBAR FUSION 4 LEVEL N/A 10/19/2017   Procedure: Thoracic Eight to Pelvis fixation and pedicle screws  Transforaminal Lumbar Interbody Fusion Lumbar Four-Five with possible osteotomy;  Surgeon: Erline Levine, MD;  Location: [facility];  Service: Neurosurgery;  Laterality: N/A;  Thoracic Eight to Pelvis fixation and pedicle screws  Transforaminal Lumbar Interbody Fusion Lumbar Four-Five with possible osteotomy     Family History  Problem Relation Age of Onset  . Hypertension Mother   . Kidney disease Mother   . Hyperlipidemia Mother   . Colon cancer Father   . Pancreatitis Other   . Diabetes Other     SOCIAL HX: Former smoker   Current Outpatient Medications:  .  amLODipine (NORVASC) 2.5 MG tablet, TAKE 1 TABLET(2.5 MG) BY MOUTH DAILY, Disp: 90 tablet, Rfl: 2 .  Ascorbic Acid (VITAMIN C PO), Take by mouth daily., Disp: , Rfl:  .  aspirin 81 MG tablet, Take 81 mg by mouth daily. , Disp: , Rfl:  .  atorvastatin (LIPITOR) 40 MG tablet, Take 1 tablet (40 mg total) by mouth daily at 6 PM., Disp: 90 tablet, Rfl: 1 .  B Complex Vitamins (VITAMIN B COMPLEX PO), Take by mouth daily., Disp: , Rfl:  .  VITAMIN E PO, Take by mouth daily., Disp: , Rfl:  .  gabapentin (NEURONTIN) 100 MG capsule, Take 1 capsule (100 mg total) by mouth 3 (three) times daily., Disp: 90 capsule, Rfl: 3  EXAM:  VITALS per patient if applicable:  GENERAL: alert, oriented, appears well and in no acute distress  HEENT: atraumatic, conjunttiva clear, no obvious abnormalities on inspection of external nose and ears  NECK: normal movements of the head and neck  LUNGS: on inspection no signs of respiratory distress, breathing rate appears normal, no obvious gross SOB, gasping or wheezing  CV: no obvious cyanosis  MS: moves all visible extremities without noticeable  abnormality  PSYCH/NEURO: pleasant and cooperative, no obvious depression or anxiety, speech and thought processing grossly intact  ASSESSMENT AND PLAN:  Discussed the following assessment and plan:  Benign essential HTN Adequately controlled.  Continue current regimen. Check lab work.  Neuropathy Chronic issue.  Potentially related to prior back surgery or prior nerve impingement damage.  Discussed trial of gabapentin to see if that would be beneficial.  Advised to monitor for drowsiness and not drive if he is drowsy.  He will contact us in 2 weeks to let us know if his symptoms have improved with the starting dose.  HLD (hyperlipidemia) Continue Lipitor.  Wheezing Sounds to be coming from his throat.  Possibly associated with some mild throat congestion.  Could be allergy related.  Could be weight related.  Discussed trial of Claritin or Zyrtec.  If not improving he should let us know.   Orders Placed This Encounter  Procedures  . Hemoglobin A1c    Standing Status:   Future    Standing Expiration Date:  11/19/2020  . LDL cholesterol, direct    Standing Status:   Future    Standing Expiration Date:   11/19/2020  . Comp Met (CMET)    Standing Status:   Future    Standing Expiration Date:   11/19/2020    Meds ordered this encounter  Medications  . amLODipine (NORVASC) 2.5 MG tablet    Sig: TAKE 1 TABLET(2.5 MG) BY MOUTH DAILY    Dispense:  90 tablet    Refill:  2  . gabapentin (NEURONTIN) 100 MG capsule    Sig: Take 1 capsule (100 mg total) by mouth 3 (three) times daily.    Dispense:  90 capsule    Refill:  3     I discussed the assessment and treatment plan with the patient. The patient was provided an opportunity to ask questions and all were answered. The patient agreed with the plan and demonstrated an understanding of the instructions.   The patient was advised to call back or seek an in-person evaluation if the symptoms worsen or if the condition fails to improve  as anticipated.    [name], MD

## 2019-11-20 NOTE — Assessment & Plan Note (Signed)
Sounds to be coming from his throat.  Possibly associated with some mild throat congestion.  Could be allergy related.  Could be weight related.  Discussed trial of Claritin or Zyrtec.  If not improving he should let us know.

## 2019-11-21 ENCOUNTER — Telehealth: Payer: Self-pay | Admitting: Family Medicine

## 2019-11-21 NOTE — Telephone Encounter (Signed)
Pt changed pharmacy to [facility] on [address]. Please send gabapentin and amlodipine. Pt would like a call back confirming

## 2019-11-21 NOTE — Telephone Encounter (Signed)
I called [facility] @ [address] and they transferred the rX for the patient from [facility] on [address].  Terris Germano,cma

## 2019-11-21 NOTE — Telephone Encounter (Signed)
I called and informed the patient that his medications were transferred to [facility] on [address], he understood.  Garland Smouse,cma

## 2019-11-25 ENCOUNTER — Other Ambulatory Visit: Payer: Self-pay | Admitting: Family Medicine

## 2019-12-06 ENCOUNTER — Other Ambulatory Visit (INDEPENDENT_AMBULATORY_CARE_PROVIDER_SITE_OTHER): Payer: 59

## 2019-12-06 ENCOUNTER — Other Ambulatory Visit: Payer: Self-pay

## 2019-12-06 DIAGNOSIS — R945 Abnormal results of liver function studies: Secondary | ICD-10-CM

## 2019-12-06 DIAGNOSIS — E785 Hyperlipidemia, unspecified: Secondary | ICD-10-CM | POA: Diagnosis not present

## 2019-12-06 DIAGNOSIS — R7303 Prediabetes: Secondary | ICD-10-CM | POA: Diagnosis not present

## 2019-12-06 DIAGNOSIS — I10 Essential (primary) hypertension: Secondary | ICD-10-CM | POA: Diagnosis not present

## 2019-12-06 DIAGNOSIS — R7989 Other specified abnormal findings of blood chemistry: Secondary | ICD-10-CM

## 2019-12-06 LAB — COMPREHENSIVE METABOLIC PANEL
ALT: 39 U/L (ref 0–53)
AST: 28 U/L (ref 0–37)
Albumin: 4.2 g/dL (ref 3.5–5.2)
Alkaline Phosphatase: 75 U/L (ref 39–117)
BUN: 17 mg/dL (ref 6–23)
CO2: 25 mEq/L (ref 19–32)
Calcium: 9.5 mg/dL (ref 8.4–10.5)
Chloride: 103 mEq/L (ref 96–112)
Creatinine, Ser: 1.1 mg/dL (ref 0.40–1.50)
GFR: 67.45 mL/min (ref >60.00)
Glucose, Bld: 86 mg/dL (ref 70–99)
Potassium: 4.3 mEq/L (ref 3.5–5.1)
Sodium: 136 mEq/L (ref 135–145)
Total Bilirubin: 0.5 mg/dL (ref 0.2–1.2)
Total Protein: 6.9 g/dL (ref 6.0–8.3)

## 2019-12-06 LAB — HEPATIC FUNCTION PANEL
ALT: 39 U/L (ref 0–53)
AST: 28 U/L (ref 0–37)
Albumin: 4.2 g/dL (ref 3.5–5.2)
Alkaline Phosphatase: 75 U/L (ref 39–117)
Bilirubin, Direct: 0.1 mg/dL (ref 0.0–0.3)
Total Bilirubin: 0.5 mg/dL (ref 0.2–1.2)
Total Protein: 6.9 g/dL (ref 6.0–8.3)

## 2019-12-06 LAB — HEMOGLOBIN A1C: Hgb A1c MFr Bld: 6 % (ref 4.6–6.5)

## 2019-12-06 LAB — LDL CHOLESTEROL, DIRECT: Direct LDL: 81 mg/dL

## 2020-02-21 ENCOUNTER — Ambulatory Visit (INDEPENDENT_AMBULATORY_CARE_PROVIDER_SITE_OTHER): Payer: 59 | Admitting: Family Medicine

## 2020-02-21 ENCOUNTER — Encounter: Payer: Self-pay | Admitting: Family Medicine

## 2020-02-21 ENCOUNTER — Other Ambulatory Visit: Payer: Self-pay

## 2020-02-21 DIAGNOSIS — G629 Polyneuropathy, unspecified: Secondary | ICD-10-CM

## 2020-02-21 DIAGNOSIS — Z6835 Body mass index (BMI) 35.0-35.9, adult: Secondary | ICD-10-CM

## 2020-02-21 DIAGNOSIS — E669 Obesity, unspecified: Secondary | ICD-10-CM | POA: Insufficient documentation

## 2020-02-21 DIAGNOSIS — I10 Essential (primary) hypertension: Secondary | ICD-10-CM

## 2020-02-21 NOTE — Patient Instructions (Signed)
Nice to see you. Please continue with diet and exercise. 

## 2020-02-21 NOTE — Assessment & Plan Note (Signed)
Patient has lost a good amount of weight.  Encouraged him to continue diet and exercise.

## 2020-02-21 NOTE — Progress Notes (Signed)
  [name], MD Phone: [phone]  Bobby [name] is a 64 y.o. male who presents today for f/u.  Neuropathy: Patient notes his neuropathy is significantly better.  Very minimal in the bottom of his feet.  The gabapentin has been helpful.  No drowsiness.  Obesity: Patient has been doing the noom app for dietary changes and notes it has been working quite well.  He is also been exercising by doing some light weights and walking.  He is lost about 25 pounds.  Hypertension: Typically 120s-70s.  Taking amlodipine.  No chest pain or shortness of breath.  Social History   Tobacco Use  Smoking Status Former Smoker  . Packs/day: 1.00  . Years: 15.00  . Pack years: 15.00  . Types: Cigarettes, Cigars  . Quit date: 08/24/2007  . Years since quitting: 12.5  Smokeless Tobacco Never Used  Tobacco Comment   " quit smoking around 2007"     ROS see history of present illness  Objective  Physical Exam Vitals:   02/21/20 1442  BP: 130/80  Pulse: 61  Temp: 97.9 F (36.6 C)  SpO2: 99%    BP Readings from Last 3 Encounters:  02/21/20 130/80  06/05/19 94/70  05/19/19 120/80   Wt Readings from Last 3 Encounters:  02/21/20 216 lb (98 kg)  11/20/19 235 lb (106.6 kg)  06/05/19 221 lb (100.2 kg)    Physical Exam Constitutional:      General: He is not in acute distress.    Appearance: He is not diaphoretic.  Cardiovascular:     Rate and Rhythm: Normal rate and regular rhythm.     Heart sounds: Normal heart sounds.  Pulmonary:     Effort: Pulmonary effort is normal.     Breath sounds: Normal breath sounds.  Musculoskeletal:     Right lower leg: No edema.     Left lower leg: No edema.  Skin:    General: Skin is warm and dry.  Neurological:     Mental Status: He is alert.      Assessment/Plan: Please see individual problem list.  Neuropathy Much improved.  Continue gabapentin.  Benign essential HTN Adequate control.  Continue current  regimen.  Obesity Patient has lost a good amount of weight.  Encouraged him to continue diet and exercise.  No orders of the defined types were placed in this encounter.   No orders of the defined types were placed in this encounter.   This visit occurred during the SARS-CoV-2 public health emergency.  Safety protocols were in place, including screening questions prior to the visit, additional usage of staff PPE, and extensive cleaning of exam room while observing appropriate contact time as indicated for disinfecting solutions.    [name], MD [place]

## 2020-02-21 NOTE — Assessment & Plan Note (Signed)
Much improved.  Continue gabapentin.

## 2020-02-21 NOTE — Assessment & Plan Note (Signed)
Adequate control. Continue current regimen.  

## 2020-03-24 ENCOUNTER — Other Ambulatory Visit: Payer: Self-pay | Admitting: Family Medicine

## 2020-04-23 ENCOUNTER — Telehealth: Payer: Self-pay | Admitting: Family Medicine

## 2020-04-23 MED ORDER — GABAPENTIN 100 MG PO CAPS: 100.0000 mg | ORAL_CAPSULE | Freq: Three times a day (TID) | ORAL | 3 refills | Status: DC

## 2020-04-23 NOTE — Telephone Encounter (Signed)
Patient needs a refill on his gabapentin (NEURONTIN) 100 MG capsule.

## 2020-04-24 NOTE — Telephone Encounter (Signed)
Pt called to check ongabapentin (NEURONTIN) 100 MG capsule to see if it was ordered

## 2020-08-13 ENCOUNTER — Telehealth: Payer: Self-pay | Admitting: Family Medicine

## 2020-08-13 MED ORDER — AMLODIPINE BESYLATE 2.5 MG PO TABS: 2.5000 mg | ORAL_TABLET | Freq: Every day | ORAL | 2 refills | Status: DC

## 2020-08-13 NOTE — Telephone Encounter (Signed)
Pt needs a refill on amLODipine (NORVASC) 2.5 MG tablet sent to [facility]

## 2020-08-13 NOTE — Addendum Note (Signed)
Addended by: Elpidio Galea T on: 08/13/2020 10:47 AM   Modules accepted: Orders

## 2020-09-02 ENCOUNTER — Ambulatory Visit: Payer: 59 | Admitting: Family Medicine

## 2020-09-06 ENCOUNTER — Encounter: Payer: Self-pay | Admitting: Family Medicine

## 2020-09-06 ENCOUNTER — Other Ambulatory Visit: Payer: Self-pay

## 2020-09-06 ENCOUNTER — Ambulatory Visit (INDEPENDENT_AMBULATORY_CARE_PROVIDER_SITE_OTHER): Payer: 59 | Admitting: Family Medicine

## 2020-09-06 VITALS — BP 121/80 | HR 66 | Temp 98.5°F | Ht 65.0 in | Wt 195.2 lb

## 2020-09-06 DIAGNOSIS — I10 Essential (primary) hypertension: Secondary | ICD-10-CM

## 2020-09-06 DIAGNOSIS — R21 Rash and other nonspecific skin eruption: Secondary | ICD-10-CM

## 2020-09-06 DIAGNOSIS — Z23 Encounter for immunization: Secondary | ICD-10-CM | POA: Diagnosis not present

## 2020-09-06 DIAGNOSIS — E785 Hyperlipidemia, unspecified: Secondary | ICD-10-CM

## 2020-09-06 MED ORDER — KETOCONAZOLE 2 % EX CREA: 1.0000 "application " | TOPICAL_CREAM | Freq: Every day | CUTANEOUS | 0 refills | Status: AC

## 2020-09-06 NOTE — Assessment & Plan Note (Signed)
Adequate control at home. Continue amlodipine 2.5 mg once daily.

## 2020-09-06 NOTE — Patient Instructions (Signed)
Nice to see you. We will try ketoconazole for your rash. If it is not improving over the next 1 to 2 weeks please let us know. We will contact you with your labs.

## 2020-09-06 NOTE — Assessment & Plan Note (Signed)
Continue Lipitor 20 mg once daily. Check lipid panel and hepatic function panel.

## 2020-09-06 NOTE — Progress Notes (Signed)
  [name], MD Phone: [phone]  APOLO [name] is a 64 y.o. male who presents today for f/u.  HYPERTENSION  Disease Monitoring  Home BP Monitoring 120s/70s Chest pain- no    Dyspnea- no Medications  Compliance-  Taking amlodipine.   Edema- no  HYPERLIPIDEMIA Symptoms Chest pain on exertion:  no    Medications: Compliance- taking lipitor Right upper quadrant pain- no  Muscle aches- no  Rash: Patient noted spots on his inner left upper arm several weeks ago. There is no itching or pain. Notes they have faded slightly. No changes to soaps or detergent. No significant travel.   Social History   Tobacco Use  Smoking Status Former Smoker  . Packs/day: 1.00  . Years: 15.00  . Pack years: 15.00  . Types: Cigarettes, Cigars  . Quit date: 08/24/2007  . Years since quitting: 13.0  Smokeless Tobacco Never Used  Tobacco Comment   " quit smoking around 2007"     ROS see history of present illness  Objective  Physical Exam Vitals:   09/06/20 1418  BP: 121/80  Pulse: 66  Temp: 98.5 F (36.9 C)  SpO2: 99%    BP Readings from Last 3 Encounters:  09/06/20 121/80  02/21/20 130/80  06/05/19 94/70   Wt Readings from Last 3 Encounters:  09/06/20 195 lb 3.2 oz (88.5 kg)  02/21/20 216 lb (98 kg)  11/20/19 235 lb (106.6 kg)    Physical Exam Constitutional:      General: He is not in acute distress.    Appearance: He is not diaphoretic.  Cardiovascular:     Rate and Rhythm: Normal rate and regular rhythm.     Heart sounds: Normal heart sounds.  Pulmonary:     Effort: Pulmonary effort is normal.     Breath sounds: Normal breath sounds.  Skin:    General: Skin is warm and dry.       Neurological:     Mental Status: He is alert.      Assessment/Plan: Please see individual problem list.  Problem List Items Addressed This Visit    Benign essential HTN    Adequate control at home. Continue amlodipine 2.5 mg once daily.      HLD (hyperlipidemia)     Continue Lipitor 20 mg once daily. Check lipid panel and hepatic function panel.      Relevant Orders   Hepatic function panel   Lipid panel   Rash    Possible tinea corporis. We will treat with ketoconazole to be applied once daily until resolved. If not improving he will let us know.      Relevant Medications   ketoconazole (NIZORAL) 2 % cream    Other Visit Diagnoses    Need for immunization against influenza    -  Primary   Relevant Orders   Flu Vaccine QUAD 36+ mos IM (Completed)     Health maintenance: Patient will get his COVID-19 booster at the pharmacy.  This visit occurred during the SARS-CoV-2 public health emergency.  Safety protocols were in place, including screening questions prior to the visit, additional usage of staff PPE, and extensive cleaning of exam room while observing appropriate contact time as indicated for disinfecting solutions.    [name], MD [place]

## 2020-09-06 NOTE — Assessment & Plan Note (Signed)
Possible tinea corporis. We will treat with ketoconazole to be applied once daily until resolved. If not improving he will let us know.

## 2020-09-07 LAB — LIPID PANEL
Cholesterol: 161 mg/dL (ref <200)
HDL: 46 mg/dL (ref >=40)
LDL Cholesterol (Calc): 92 mg/dL (calc)
Non-HDL Cholesterol (Calc): 115 mg/dL (calc) (ref <130)
Total CHOL/HDL Ratio: 3.5 (calc) (ref <5.0)
Triglycerides: 129 mg/dL (ref <150)

## 2020-09-07 LAB — HEPATIC FUNCTION PANEL
AG Ratio: 1.6 (calc) (ref 1.0–2.5)
ALT: 47 U/L — ABNORMAL HIGH (ref 9–46)
AST: 34 U/L (ref 10–35)
Albumin: 4.2 g/dL (ref 3.6–5.1)
Alkaline phosphatase (APISO): 69 U/L (ref 35–144)
Bilirubin, Direct: 0.1 mg/dL (ref 0.0–0.2)
Globulin: 2.6 g/dL (calc) (ref 1.9–3.7)
Indirect Bilirubin: 0.5 mg/dL (calc) (ref 0.2–1.2)
Total Bilirubin: 0.6 mg/dL (ref 0.2–1.2)
Total Protein: 6.8 g/dL (ref 6.1–8.1)

## 2020-09-19 ENCOUNTER — Other Ambulatory Visit: Payer: Self-pay | Admitting: Family Medicine

## 2020-09-19 DIAGNOSIS — R7989 Other specified abnormal findings of blood chemistry: Secondary | ICD-10-CM

## 2020-09-21 ENCOUNTER — Other Ambulatory Visit: Payer: Self-pay | Admitting: Family Medicine

## 2020-10-08 ENCOUNTER — Other Ambulatory Visit (INDEPENDENT_AMBULATORY_CARE_PROVIDER_SITE_OTHER): Payer: 59

## 2020-10-08 ENCOUNTER — Other Ambulatory Visit: Payer: Self-pay

## 2020-10-08 DIAGNOSIS — R7989 Other specified abnormal findings of blood chemistry: Secondary | ICD-10-CM

## 2020-10-08 DIAGNOSIS — R945 Abnormal results of liver function studies: Secondary | ICD-10-CM

## 2020-10-08 LAB — HEPATIC FUNCTION PANEL
ALT: 36 U/L (ref 0–53)
AST: 25 U/L (ref 0–37)
Albumin: 4.5 g/dL (ref 3.5–5.2)
Alkaline Phosphatase: 61 U/L (ref 39–117)
Bilirubin, Direct: 0.1 mg/dL (ref 0.0–0.3)
Total Bilirubin: 0.5 mg/dL (ref 0.2–1.2)
Total Protein: 6.9 g/dL (ref 6.0–8.3)

## 2020-10-29 ENCOUNTER — Other Ambulatory Visit: Payer: Self-pay | Admitting: Family Medicine

## 2020-11-01 ENCOUNTER — Other Ambulatory Visit: Payer: Self-pay | Admitting: Family Medicine

## 2020-11-03 ENCOUNTER — Other Ambulatory Visit: Payer: Self-pay | Admitting: Family Medicine

## 2021-01-21 ENCOUNTER — Telehealth: Payer: Self-pay | Admitting: Family Medicine

## 2021-01-21 ENCOUNTER — Other Ambulatory Visit: Payer: Self-pay

## 2021-01-21 DIAGNOSIS — E785 Hyperlipidemia, unspecified: Secondary | ICD-10-CM

## 2021-01-21 MED ORDER — ATORVASTATIN CALCIUM 20 MG PO TABS: ORAL_TABLET | ORAL | 3 refills | Status: DC

## 2021-01-21 NOTE — Telephone Encounter (Signed)
Refill sent to pharmacy per patient request.   Kourtnee Lahey,cma

## 2021-01-21 NOTE — Telephone Encounter (Signed)
Patient called in for refill atorvastatin (LIPITOR) 40 MG tablet

## 2021-01-27 ENCOUNTER — Telehealth: Payer: Self-pay

## 2021-01-27 NOTE — Telephone Encounter (Signed)
Please follow-up with the patient today to see how he is feeling.  Thanks.

## 2021-01-27 NOTE — Telephone Encounter (Signed)
Patient stated he is feeling a lot better and sx are better as well.

## 2021-01-27 NOTE — Telephone Encounter (Signed)
Home care advice was given to patient by access nurse.     [facility] RECORD AccessNurse Patient Name: Bobby [name] Gender: Male DOB: Jan 09, 1956 Age: 65 Y 10 M 22 D Return Phone Number: [phone] (Primary), [phone] (Secondary) Address: City/ State/ Zip: [place] [address] [address] Client Site [place] - Day Physician [name] - MD Contact Type Call Who Is Calling Patient / Member / Family / Caregiver Call Type Triage / Clinical Caller Name Rael Yo Relationship To Patient Spouse Return Phone Number [phone] (Primary) Chief Complaint Vomiting Reason for Call Symptomatic / Request for [place] states her husband has a fever of 99.1 and vomiting. Translation No Nurse Assessment Nurse: Lucky Cowboy, RN, Levada Dy Date/Time (Eastern Time): 01/24/2021 10:36:57 AM Confirm and document reason for call. If symptomatic, describe symptoms. ---Caller stated that he vomited yesterday, diarrhea x5 since yesterday. He is drinking and has urinated in the past 8 hours. Does the patient have any new or worsening symptoms? ---Yes Will a triage be completed? ---Yes Related visit to physician within the last 2 weeks? ---No Does the PT have any chronic conditions? (i.e. diabetes, asthma, this includes High risk factors for pregnancy, etc.) ---Yes List chronic conditions. ---HTN, high cholesterol, neuropathy Is this a behavioral health or substance abuse call? ---No Guidelines Guideline Title Affirmed Question Affirmed Notes Nurse Date/Time (Eastern Time) Diarrhea MILD-MODERATE diarrhea (e.g., 1-6 times / day more than normal) Dew, RN, Levada Dy 01/24/2021 10:39:27 AM Disp. Time Eilene Ghazi Time) Disposition Final User PLEASE NOTE: All timestamps contained within this report are represented as  [place] Standard Time. CONFIDENTIALTY NOTICE: This fax transmission is intended only for the addressee. It contains information that is legally privileged, confidential or otherwise protected from use or disclosure. If you are not the intended recipient, you are strictly prohibited from reviewing, disclosing, copying using or disseminating any of this information or taking any action in reliance on or regarding this information. If you have received this fax in error, please notify us immediately by telephone so that we can arrange for its return to [place]. Phone: [phone], Toll-Free: [phone], Fax: [phone] Page: 2 of 2 Call Id: [id number] 01/24/2021 10:45:31 AM Home Care Yes Dew, RN, Marin Shutter Disagree/Comply Comply Caller Understands Yes PreDisposition Call Doctor Care Advice Given Per Guideline HOME CARE: * You should be able to treat this at home. REASSURANCE AND EDUCATION - DIARRHEA: * Diarrhea may caused by a virus ('stomach flu') or a bacteria. Diarrhea is one of the body's way of getting rid of germs. * Certain foods (e.g., dairy products, supplements like Ensure) can also trigger diarrhea. * In some people, the exact cause is never found. * Staying well-hydrated is the most important thing if you have diarrhea. From what you have told me, it sounds like you are not severely dehydrated at this point. * Here is some general care advice that should help. FLUID THERAPY DURING MILD TO MODERATE DIARRHEA: * Drink more fluids, at least 8 to 10 cups daily. One cup equals 8 oz (240 ml). * WATER: For mild to moderate diarrhea, water is often the best liquid to drink. You should also eat some salty foods (e.g., potato chips, pretzels, saltine crackers). This is important to make sure you are getting enough salt, sugars, and fluids to meet your body's needs. * SPORTS DRINKS: You can also drink half-strength sports drinks (e.g., Gatorade, Powerade)  to help treat and prevent dehydration. Mix  the sports drink half and half with water. * Avoid caffeinated beverages. Reason: Caffeine is mildly dehydrating. * Avoid alcohol beverages (e.g., beer, wine, hard liquor). * Avoid carbonated soft drinks (soda) as these can make your diarrhea worse. * AVOID milk and dairy products if these make your diarrhea worse. * AVOID greasy, fatty or spicy foods. * As the diarrhea starts to get better, you can slowly return to a normal diet. DIARRHEA MEDICINE - LOPERAMIDE (IMODIUM AD): * This medicine helps decrease diarrhea. It is available over-the-counter (OTC) in a drugstore. * Adult dosage: 4 mg (2 capsules) is the recommended first dose. You may take an additional 2 mg (1 capsule) after each loose stool. * Maximum dosage: 16 mg per day (8 capsules). * Do not use for more than 2 days. DIARRHEA MEDICINE - LOPERAMIDE - EXTRA NOTES AND WARNINGS: * DO NOT use if there is a fever over 100.4 F (38.0 C) or if there is blood or mucus in the stools. * DO NOT drink tonic water. It can interact with loperamide and may cause a serious heart problems. CONTAGIOUSNESS: * Wash your hands after using the bathroom. * Wash your hands before fixing or eating food. * Wash soiled towels, sheets, or clothes separately. * Do not share towels or sheets. * Do not swim for 2 weeks after diarrhea is gone. EXPECTED COURSE: * Viral diarrhea lasts 4 to 7 days. * It is usually worse on days 1 and 2. CALL BACK IF: * Signs of dehydration occur (e.g., no urine over 12 hours, very dry mouth, lightheaded, etc.) * Diarrhea lasts over 7 days * You become worse CARE ADVICE given per Diarrhea (Adult) guideline.

## 2021-02-04 ENCOUNTER — Other Ambulatory Visit: Payer: Self-pay | Admitting: Family Medicine

## 2021-03-07 ENCOUNTER — Encounter: Payer: Self-pay | Admitting: Family Medicine

## 2021-03-07 ENCOUNTER — Ambulatory Visit (INDEPENDENT_AMBULATORY_CARE_PROVIDER_SITE_OTHER): Payer: Medicare Other | Admitting: Family Medicine

## 2021-03-07 ENCOUNTER — Other Ambulatory Visit: Payer: Self-pay

## 2021-03-07 VITALS — BP 130/80 | HR 65 | Temp 98.2°F | Ht 65.0 in | Wt 193.8 lb

## 2021-03-07 DIAGNOSIS — G629 Polyneuropathy, unspecified: Secondary | ICD-10-CM

## 2021-03-07 DIAGNOSIS — I10 Essential (primary) hypertension: Secondary | ICD-10-CM | POA: Diagnosis not present

## 2021-03-07 DIAGNOSIS — D1722 Benign lipomatous neoplasm of skin and subcutaneous tissue of left arm: Secondary | ICD-10-CM

## 2021-03-07 DIAGNOSIS — R110 Nausea: Secondary | ICD-10-CM

## 2021-03-07 DIAGNOSIS — Z1283 Encounter for screening for malignant neoplasm of skin: Secondary | ICD-10-CM

## 2021-03-07 DIAGNOSIS — R10816 Epigastric abdominal tenderness: Secondary | ICD-10-CM | POA: Diagnosis not present

## 2021-03-07 DIAGNOSIS — R7303 Prediabetes: Secondary | ICD-10-CM

## 2021-03-07 DIAGNOSIS — E785 Hyperlipidemia, unspecified: Secondary | ICD-10-CM

## 2021-03-07 DIAGNOSIS — D179 Benign lipomatous neoplasm, unspecified: Secondary | ICD-10-CM | POA: Insufficient documentation

## 2021-03-07 MED ORDER — AMLODIPINE BESYLATE 2.5 MG PO TABS: 2.5000 mg | ORAL_TABLET | Freq: Every day | ORAL | 2 refills | Status: AC

## 2021-03-07 MED ORDER — GABAPENTIN 100 MG PO CAPS: 100.0000 mg | ORAL_CAPSULE | Freq: Three times a day (TID) | ORAL | 0 refills | Status: DC

## 2021-03-07 MED ORDER — ATORVASTATIN CALCIUM 20 MG PO TABS: ORAL_TABLET | ORAL | 3 refills | Status: AC

## 2021-03-07 NOTE — Assessment & Plan Note (Signed)
This is a chronic issue.  Gabapentin has been beneficial.  He will continue this medication.

## 2021-03-07 NOTE — Assessment & Plan Note (Signed)
Well-controlled.  He will continue amlodipine 2.5 mg once daily.

## 2021-03-07 NOTE — Patient Instructions (Signed)
Nice to see you. We will get lab work today. I refilled your medications. If you have abdominal pain please seek medical attention.

## 2021-03-07 NOTE — Progress Notes (Signed)
[name], MD Phone: [phone]  Bobby [name] is a 65 y.o. male who presents today for f/u.  HYPERTENSION  Disease Monitoring  Home BP Monitoring 120-125/75 Chest pain- no    Dyspnea- no Medications  Compliance-  Taking amlodipine.  Edema- no  HYPERLIPIDEMIA Symptoms Chest pain on exertion:  no Medications: Compliance- taking lipitor Right upper quadrant pain- no  Muscle aches- no  Neuropathy: This continues to be an issue.  Gabapentin is quite beneficial.  Notes left greater than right foot tingling and discomfort.  This was previously noted to be likely related to prior back surgery.  Nausea: Patient notes occasionally for about an hour after he gets up in the morning he will feel nauseous.  This will typically improve once he is able to get himself to eat.  He notes no abdominal pain, blood in stool, vomiting, or diarrhea.  No new medications or supplements.  He was sick with a GI illness in April and he wonders if this is a carryover from that.  The patient requested a referral to dermatology for full body skin exam.  Patient notes a bump on his left forearm.  He is not sure why it is there.  He may have bumped his arm.  There is no overlying bruising or pain.  Social History   Tobacco Use  Smoking Status Former Smoker  . Packs/day: 1.00  . Years: 15.00  . Pack years: 15.00  . Types: Cigarettes, Cigars  . Quit date: 08/24/2007  . Years since quitting: 13.5  Smokeless Tobacco Never Used  Tobacco Comment   " quit smoking around 2007"    Current Outpatient Medications on File Prior to Visit  Medication Sig Dispense Refill  . aspirin 81 MG tablet Take 81 mg by mouth daily.     . B Complex Vitamins (VITAMIN B COMPLEX PO) Take by mouth daily.    Marland Kitchen ketoconazole (NIZORAL) 2 % cream Apply 1 application topically daily. 15 g 0  . VITAMIN E PO Take by mouth daily.     No current facility-administered medications on file prior to visit.     ROS see history of  present illness  Objective  Physical Exam Vitals:   03/07/21 1458  BP: 130/80  Pulse: 65  Temp: 98.2 F (36.8 C)  SpO2: 99%    BP Readings from Last 3 Encounters:  03/07/21 130/80  09/06/20 121/80  02/21/20 130/80   Wt Readings from Last 3 Encounters:  03/07/21 193 lb 12.8 oz (87.9 kg)  09/06/20 195 lb 3.2 oz (88.5 kg)  02/21/20 216 lb (98 kg)    Physical Exam Constitutional:      General: He is not in acute distress.    Appearance: He is not diaphoretic.  Cardiovascular:     Rate and Rhythm: Normal rate and regular rhythm.     Heart sounds: Normal heart sounds.  Pulmonary:     Effort: Pulmonary effort is normal.     Breath sounds: Normal breath sounds.  Abdominal:     General: Bowel sounds are normal. There is no distension.     Palpations: Abdomen is soft.     Tenderness: There is abdominal tenderness (Very minimal discomfort on palpation of his epigastric area). There is no guarding or rebound.  Musculoskeletal:       Arms:     Right lower leg: No edema.     Left lower leg: No edema.  Skin:    General: Skin is warm and dry.  Neurological:     Mental Status: He is alert.      Assessment/Plan: Please see individual problem list.  Problem List Items Addressed This Visit    Benign essential HTN    Well-controlled.  He will continue amlodipine 2.5 mg once daily.      Relevant Medications   amLODipine (NORVASC) 2.5 MG tablet   atorvastatin (LIPITOR) 20 MG tablet   HLD (hyperlipidemia)    Continue Lipitor 20 mg once daily.  Check labs.      Relevant Medications   amLODipine (NORVASC) 2.5 MG tablet   atorvastatin (LIPITOR) 20 MG tablet   Other Relevant Orders   Comp Met (CMET)   Lipid panel   Neuropathy    This is a chronic issue.  Gabapentin has been beneficial.  He will continue this medication.      Relevant Medications   gabapentin (NEURONTIN) 100 MG capsule   Nausea    Possibly postinfectious IBS from his recent viral GI illness.  He  noted slight discomfort epigastrically on exam.  We will check lab work to evaluate for other causes.  He will monitor and get evaluated in person if he has worsening abdominal pain.      Relevant Orders   Comp Met (CMET)   Lipase   CBC w/Diff   Prediabetes    Check A1c.      Relevant Orders   HgB A1c   Screening exam for skin cancer    Refer to dermatology.      Lipoma    I suspect this is a lipoma versus prominent fatty tissue.  He will monitor for pain.       Other Visit Diagnoses    Epigastric abdominal tenderness without rebound tenderness    -  Primary   Relevant Orders   Comp Met (CMET)   Lipase   CBC w/Diff      Return in about 6 months (around 09/06/2021).  This visit occurred during the SARS-CoV-2 public health emergency.  Safety protocols were in place, including screening questions prior to the visit, additional usage of staff PPE, and extensive cleaning of exam room while observing appropriate contact time as indicated for disinfecting solutions.    [name], MD [place]

## 2021-03-07 NOTE — Assessment & Plan Note (Addendum)
Possibly postinfectious IBS from his recent viral GI illness.  He noted slight discomfort epigastrically on exam.  We will check lab work to evaluate for other causes.  He will monitor and get evaluated in person if he has worsening abdominal pain.

## 2021-03-07 NOTE — Assessment & Plan Note (Signed)
Check A1c. 

## 2021-03-07 NOTE — Assessment & Plan Note (Signed)
I suspect this is a lipoma versus prominent fatty tissue.  He will monitor for pain.

## 2021-03-07 NOTE — Assessment & Plan Note (Signed)
Continue Lipitor 20 mg once daily.  Check labs.

## 2021-03-07 NOTE — Assessment & Plan Note (Signed)
Refer to dermatology 

## 2021-03-08 LAB — CBC WITH DIFFERENTIAL/PLATELET
Absolute Monocytes: 466 cells/uL (ref 200–950)
Basophils Absolute: 38 cells/uL (ref 0–200)
Basophils Relative: 0.6 %
Eosinophils Absolute: 158 cells/uL (ref 15–500)
Eosinophils Relative: 2.5 %
HCT: 43.1 % (ref 38.5–50.0)
Hemoglobin: 14.8 g/dL (ref 13.2–17.1)
Lymphs Abs: 1298 cells/uL (ref 850–3900)
MCH: 31.4 pg (ref 27.0–33.0)
MCHC: 34.3 g/dL (ref 32.0–36.0)
MCV: 91.5 fL (ref 80.0–100.0)
MPV: 10 fL (ref 7.5–12.5)
Monocytes Relative: 7.4 %
Neutro Abs: 4341 cells/uL (ref 1500–7800)
Neutrophils Relative %: 68.9 %
Platelets: 185 10*3/uL (ref 140–400)
RBC: 4.71 10*6/uL (ref 4.20–5.80)
RDW: 13.2 % (ref 11.0–15.0)
Total Lymphocyte: 20.6 %
WBC: 6.3 10*3/uL (ref 3.8–10.8)

## 2021-03-08 LAB — HEMOGLOBIN A1C
Hgb A1c MFr Bld: 5.4 % of total Hgb (ref <5.7)
Mean Plasma Glucose: 108 mg/dL
eAG (mmol/L): 6 mmol/L

## 2021-03-08 LAB — COMPREHENSIVE METABOLIC PANEL
AG Ratio: 1.8 (calc) (ref 1.0–2.5)
ALT: 38 U/L (ref 9–46)
AST: 29 U/L (ref 10–35)
Albumin: 4.4 g/dL (ref 3.6–5.1)
Alkaline phosphatase (APISO): 55 U/L (ref 35–144)
BUN: 22 mg/dL (ref 7–25)
CO2: 22 mmol/L (ref 20–32)
Calcium: 9.7 mg/dL (ref 8.6–10.3)
Chloride: 105 mmol/L (ref 98–110)
Creat: 0.91 mg/dL (ref 0.70–1.25)
Globulin: 2.5 g/dL (calc) (ref 1.9–3.7)
Glucose, Bld: 89 mg/dL (ref 65–99)
Potassium: 4.3 mmol/L (ref 3.5–5.3)
Sodium: 138 mmol/L (ref 135–146)
Total Bilirubin: 0.4 mg/dL (ref 0.2–1.2)
Total Protein: 6.9 g/dL (ref 6.1–8.1)

## 2021-03-08 LAB — LIPID PANEL
Cholesterol: 150 mg/dL (ref <200)
HDL: 48 mg/dL (ref >=40)
LDL Cholesterol (Calc): 81 mg/dL (calc)
Non-HDL Cholesterol (Calc): 102 mg/dL (calc) (ref <130)
Total CHOL/HDL Ratio: 3.1 (calc) (ref <5.0)
Triglycerides: 111 mg/dL (ref <150)

## 2021-03-08 LAB — LIPASE: Lipase: 14 U/L (ref 7–60)

## 2021-03-10 ENCOUNTER — Other Ambulatory Visit: Payer: Self-pay | Admitting: Family Medicine

## 2021-03-10 DIAGNOSIS — G629 Polyneuropathy, unspecified: Secondary | ICD-10-CM

## 2021-04-16 ENCOUNTER — Other Ambulatory Visit: Payer: Self-pay | Admitting: Family Medicine

## 2021-04-16 DIAGNOSIS — G629 Polyneuropathy, unspecified: Secondary | ICD-10-CM

## 2021-05-24 ENCOUNTER — Other Ambulatory Visit: Payer: Self-pay | Admitting: Family Medicine

## 2021-05-24 DIAGNOSIS — G629 Polyneuropathy, unspecified: Secondary | ICD-10-CM

## 2021-06-11 ENCOUNTER — Other Ambulatory Visit: Payer: Self-pay

## 2021-06-11 DIAGNOSIS — G629 Polyneuropathy, unspecified: Secondary | ICD-10-CM

## 2021-06-11 MED ORDER — GABAPENTIN 100 MG PO CAPS: ORAL_CAPSULE | ORAL | 1 refills | Status: AC

## 2021-09-12 ENCOUNTER — Ambulatory Visit: Payer: Medicare Other | Admitting: Family Medicine

## 2024-08-22 NOTE — Telephone Encounter (Signed)
 Open in error
